# Patient Record
Sex: Female | Born: 1945
Health system: Southern US, Community
[De-identification: ages and names within clinical notes are randomized; demographics above are authoritative.]

## PROBLEM LIST (undated history)

## (undated) DIAGNOSIS — G629 Polyneuropathy, unspecified: Secondary | ICD-10-CM

## (undated) DIAGNOSIS — Z87440 Personal history of urinary (tract) infections: Secondary | ICD-10-CM

## (undated) DIAGNOSIS — J449 Chronic obstructive pulmonary disease, unspecified: Secondary | ICD-10-CM

## (undated) DIAGNOSIS — H269 Unspecified cataract: Secondary | ICD-10-CM

## (undated) DIAGNOSIS — M8580 Other specified disorders of bone density and structure, unspecified site: Secondary | ICD-10-CM

## (undated) DIAGNOSIS — K219 Gastro-esophageal reflux disease without esophagitis: Secondary | ICD-10-CM

## (undated) DIAGNOSIS — J189 Pneumonia, unspecified organism: Secondary | ICD-10-CM

## (undated) DIAGNOSIS — C801 Malignant (primary) neoplasm, unspecified: Secondary | ICD-10-CM

## (undated) DIAGNOSIS — M1990 Unspecified osteoarthritis, unspecified site: Secondary | ICD-10-CM

## (undated) DIAGNOSIS — M810 Age-related osteoporosis without current pathological fracture: Secondary | ICD-10-CM

## (undated) DIAGNOSIS — E119 Type 2 diabetes mellitus without complications: Secondary | ICD-10-CM

## (undated) HISTORY — PX: ABDOMINAL HYSTERECTOMY: SHX81

## (undated) HISTORY — DX: Unspecified cataract: H26.9

## (undated) HISTORY — PX: EYE SURGERY: SHX253

## (undated) HISTORY — PX: CHOLECYSTECTOMY: SHX55

## (undated) HISTORY — PX: KNEE ARTHROSCOPY: SUR90

---

## 1993-05-19 DIAGNOSIS — C801 Malignant (primary) neoplasm, unspecified: Secondary | ICD-10-CM

## 1993-05-19 HISTORY — DX: Malignant (primary) neoplasm, unspecified: C80.1

## 2005-01-26 ENCOUNTER — Emergency Department: Payer: Self-pay | Admitting: Emergency Medicine

## 2005-03-22 ENCOUNTER — Emergency Department: Payer: Self-pay | Admitting: Emergency Medicine

## 2005-04-30 ENCOUNTER — Ambulatory Visit: Payer: Self-pay

## 2005-07-28 ENCOUNTER — Ambulatory Visit: Payer: Self-pay | Admitting: Physician Assistant

## 2005-10-30 ENCOUNTER — Other Ambulatory Visit: Payer: Self-pay

## 2005-11-06 ENCOUNTER — Ambulatory Visit: Payer: Self-pay | Admitting: Orthopaedic Surgery

## 2006-07-01 ENCOUNTER — Ambulatory Visit: Payer: Self-pay | Admitting: Internal Medicine

## 2007-09-15 ENCOUNTER — Ambulatory Visit: Payer: Self-pay | Admitting: Internal Medicine

## 2007-09-29 ENCOUNTER — Ambulatory Visit: Payer: Self-pay | Admitting: Internal Medicine

## 2007-12-02 ENCOUNTER — Ambulatory Visit: Payer: Self-pay | Admitting: Gastroenterology

## 2007-12-13 ENCOUNTER — Ambulatory Visit: Payer: Self-pay | Admitting: Internal Medicine

## 2008-02-23 ENCOUNTER — Ambulatory Visit: Payer: Self-pay | Admitting: Internal Medicine

## 2008-06-12 ENCOUNTER — Ambulatory Visit: Payer: Self-pay | Admitting: Internal Medicine

## 2008-10-03 ENCOUNTER — Ambulatory Visit: Payer: Self-pay | Admitting: Internal Medicine

## 2009-01-24 ENCOUNTER — Emergency Department: Payer: Self-pay | Admitting: Emergency Medicine

## 2009-02-06 ENCOUNTER — Ambulatory Visit: Payer: Self-pay | Admitting: Internal Medicine

## 2010-05-19 ENCOUNTER — Emergency Department: Payer: Self-pay | Admitting: Internal Medicine

## 2010-07-08 ENCOUNTER — Ambulatory Visit: Payer: Self-pay | Admitting: Internal Medicine

## 2010-07-18 ENCOUNTER — Ambulatory Visit: Payer: Self-pay | Admitting: Internal Medicine

## 2010-08-06 ENCOUNTER — Ambulatory Visit: Payer: Self-pay | Admitting: Internal Medicine

## 2010-12-20 ENCOUNTER — Ambulatory Visit: Payer: Self-pay | Admitting: Internal Medicine

## 2011-08-10 ENCOUNTER — Inpatient Hospital Stay: Payer: Self-pay | Admitting: Internal Medicine

## 2011-08-10 LAB — URINALYSIS, COMPLETE
Bilirubin,UR: NEGATIVE
Glucose,UR: >=500 mg/dL (ref 0–75)
Nitrite: POSITIVE
Ph: 5 (ref 4.5–8.0)
Protein: 100
RBC,UR: 7 /HPF (ref 0–5)
Specific Gravity: 1.017 (ref 1.003–1.030)

## 2011-08-10 LAB — COMPREHENSIVE METABOLIC PANEL
Alkaline Phosphatase: 79 U/L (ref 50–136)
Anion Gap: 13 (ref 7–16)
Bilirubin,Total: 0.4 mg/dL (ref 0.2–1.0)
Co2: 24 mmol/L (ref 21–32)
EGFR (African American): >60
EGFR (Non-African Amer.): >60
Glucose: 164 mg/dL — ABNORMAL HIGH (ref 65–99)
Potassium: 4 mmol/L (ref 3.5–5.1)
SGPT (ALT): 24 U/L
Sodium: 143 mmol/L (ref 136–145)
Total Protein: 8.1 g/dL (ref 6.4–8.2)

## 2011-08-10 LAB — CBC
MCHC: 32.9 g/dL (ref 32.0–36.0)
Platelet: 232 10*3/uL (ref 150–440)
RBC: 4.24 10*6/uL (ref 3.80–5.20)
RDW: 13 % (ref 11.5–14.5)

## 2011-08-11 LAB — CBC WITH DIFFERENTIAL/PLATELET
Basophil #: 0 10*3/uL (ref 0.0–0.1)
Eosinophil #: 0 10*3/uL (ref 0.0–0.7)
HCT: 34 % — ABNORMAL LOW (ref 35.0–47.0)
HGB: 11.2 g/dL — ABNORMAL LOW (ref 12.0–16.0)
Lymphocyte %: 15.9 %
MCHC: 33 g/dL (ref 32.0–36.0)
MCV: 92 fL (ref 80–100)
Monocyte #: 0.9 10*3/uL — ABNORMAL HIGH (ref 0.0–0.7)
Monocyte %: 6.7 %
Neutrophil %: 76.9 %
Platelet: 181 10*3/uL (ref 150–440)
RBC: 3.7 10*6/uL — ABNORMAL LOW (ref 3.80–5.20)
WBC: 13.6 10*3/uL — ABNORMAL HIGH (ref 3.6–11.0)

## 2011-08-11 LAB — BASIC METABOLIC PANEL
Anion Gap: 7 (ref 7–16)
BUN: 9 mg/dL (ref 7–18)
Chloride: 107 mmol/L (ref 98–107)
Co2: 26 mmol/L (ref 21–32)
EGFR (African American): >60
EGFR (Non-African Amer.): >60
Osmolality: 279 (ref 275–301)

## 2011-08-11 LAB — HEMOGLOBIN A1C: Hemoglobin A1C: 9.5 % — ABNORMAL HIGH (ref 4.2–6.3)

## 2011-08-12 LAB — CBC WITH DIFFERENTIAL/PLATELET
Basophil #: 0 10*3/uL (ref 0.0–0.1)
Basophil %: 0 %
HCT: 31.9 % — ABNORMAL LOW (ref 35.0–47.0)
HGB: 10.6 g/dL — ABNORMAL LOW (ref 12.0–16.0)
Lymphocyte #: 0.7 10*3/uL — ABNORMAL LOW (ref 1.0–3.6)
Lymphocyte %: 4.1 %
MCH: 30.7 pg (ref 26.0–34.0)
MCV: 92 fL (ref 80–100)
Monocyte #: 0.6 10*3/uL (ref 0.0–0.7)
Monocyte %: 3.8 %
Neutrophil %: 92.1 %
Platelet: 172 10*3/uL (ref 150–440)
RBC: 3.46 10*6/uL — ABNORMAL LOW (ref 3.80–5.20)
RDW: 13.2 % (ref 11.5–14.5)
WBC: 15.9 10*3/uL — ABNORMAL HIGH (ref 3.6–11.0)

## 2011-08-13 LAB — CBC WITH DIFFERENTIAL/PLATELET
Basophil %: 0.3 %
Eosinophil #: 0.1 10*3/uL (ref 0.0–0.7)
Eosinophil %: 1 %
HGB: 10.2 g/dL — ABNORMAL LOW (ref 12.0–16.0)
Lymphocyte #: 1 10*3/uL (ref 1.0–3.6)
MCH: 30.6 pg (ref 26.0–34.0)
MCHC: 33.3 g/dL (ref 32.0–36.0)
MCV: 92 fL (ref 80–100)
Monocyte #: 0.6 10*3/uL (ref 0.0–0.7)
Monocyte %: 5.8 %
Neutrophil %: 83.4 %
RBC: 3.34 10*6/uL — ABNORMAL LOW (ref 3.80–5.20)

## 2011-08-13 LAB — URINE CULTURE

## 2011-08-15 LAB — CREATININE, SERUM
Creatinine: 0.49 mg/dL — ABNORMAL LOW (ref 0.60–1.30)
EGFR (African American): >60
EGFR (Non-African Amer.): >60

## 2011-09-10 ENCOUNTER — Emergency Department: Payer: Self-pay | Admitting: Emergency Medicine

## 2011-09-25 DIAGNOSIS — E1142 Type 2 diabetes mellitus with diabetic polyneuropathy: Secondary | ICD-10-CM | POA: Insufficient documentation

## 2011-09-25 DIAGNOSIS — J449 Chronic obstructive pulmonary disease, unspecified: Secondary | ICD-10-CM | POA: Insufficient documentation

## 2011-09-25 DIAGNOSIS — I10 Essential (primary) hypertension: Secondary | ICD-10-CM | POA: Insufficient documentation

## 2011-09-29 ENCOUNTER — Ambulatory Visit: Payer: Self-pay | Admitting: Specialist

## 2011-10-06 ENCOUNTER — Ambulatory Visit: Payer: Self-pay | Admitting: Urology

## 2011-11-21 ENCOUNTER — Ambulatory Visit: Payer: Self-pay | Admitting: Internal Medicine

## 2011-12-01 ENCOUNTER — Ambulatory Visit: Payer: Self-pay | Admitting: Internal Medicine

## 2011-12-22 ENCOUNTER — Ambulatory Visit: Payer: Self-pay | Admitting: Urology

## 2011-12-22 LAB — BASIC METABOLIC PANEL
BUN: 12 mg/dL (ref 7–18)
Calcium, Total: 9.7 mg/dL (ref 8.5–10.1)
Co2: 27 mmol/L (ref 21–32)
EGFR (Non-African Amer.): >60
Glucose: 184 mg/dL — ABNORMAL HIGH (ref 65–99)
Potassium: 4 mmol/L (ref 3.5–5.1)
Sodium: 140 mmol/L (ref 136–145)

## 2012-01-02 ENCOUNTER — Ambulatory Visit: Payer: Self-pay

## 2012-01-05 ENCOUNTER — Ambulatory Visit: Payer: Self-pay | Admitting: Urology

## 2012-01-30 ENCOUNTER — Ambulatory Visit: Payer: Self-pay | Admitting: Internal Medicine

## 2012-04-18 LAB — URINALYSIS, COMPLETE
Bilirubin,UR: NEGATIVE
Nitrite: NEGATIVE
Ph: 5 (ref 4.5–8.0)
Protein: 30
RBC,UR: 8 /HPF (ref 0–5)
Specific Gravity: 1.03 (ref 1.003–1.030)

## 2012-04-18 LAB — COMPREHENSIVE METABOLIC PANEL
Alkaline Phosphatase: 95 U/L (ref 50–136)
Anion Gap: 7 (ref 7–16)
BUN: 13 mg/dL (ref 7–18)
Bilirubin,Total: 0.3 mg/dL (ref 0.2–1.0)
Creatinine: 0.59 mg/dL — ABNORMAL LOW (ref 0.60–1.30)
EGFR (Non-African Amer.): >60
Potassium: 3.9 mmol/L (ref 3.5–5.1)
SGOT(AST): 21 U/L (ref 15–37)
SGPT (ALT): 30 U/L (ref 12–78)
Total Protein: 8.6 g/dL — ABNORMAL HIGH (ref 6.4–8.2)

## 2012-04-18 LAB — CBC
HCT: 42.5 % (ref 35.0–47.0)
HGB: 14.3 g/dL (ref 12.0–16.0)
MCH: 30.4 pg (ref 26.0–34.0)
MCV: 91 fL (ref 80–100)
RBC: 4.7 10*6/uL (ref 3.80–5.20)
WBC: 20 10*3/uL — ABNORMAL HIGH (ref 3.6–11.0)

## 2012-04-19 ENCOUNTER — Inpatient Hospital Stay: Payer: Self-pay | Admitting: Internal Medicine

## 2012-04-20 LAB — CBC WITH DIFFERENTIAL/PLATELET
Basophil #: 0 10*3/uL (ref 0.0–0.1)
Eosinophil %: 3.9 %
HGB: 11.7 g/dL — ABNORMAL LOW (ref 12.0–16.0)
Lymphocyte #: 2 10*3/uL (ref 1.0–3.6)
Lymphocyte %: 39.3 %
MCH: 30.9 pg (ref 26.0–34.0)
MCV: 91 fL (ref 80–100)
Monocyte #: 0.4 x10 3/mm (ref 0.2–0.9)
Monocyte %: 8.7 %
Neutrophil %: 47.8 %
Platelet: 196 10*3/uL (ref 150–440)
RBC: 3.8 10*6/uL (ref 3.80–5.20)
RDW: 14.1 % (ref 11.5–14.5)
WBC: 5.1 10*3/uL (ref 3.6–11.0)

## 2012-04-20 LAB — BASIC METABOLIC PANEL
Anion Gap: 5 — ABNORMAL LOW (ref 7–16)
Calcium, Total: 8.6 mg/dL (ref 8.5–10.1)
Chloride: 112 mmol/L — ABNORMAL HIGH (ref 98–107)
Co2: 26 mmol/L (ref 21–32)
Creatinine: 0.74 mg/dL (ref 0.60–1.30)
EGFR (African American): >60
Glucose: 198 mg/dL — ABNORMAL HIGH (ref 65–99)
Osmolality: 290 (ref 275–301)
Potassium: 4.1 mmol/L (ref 3.5–5.1)
Sodium: 143 mmol/L (ref 136–145)

## 2012-04-20 LAB — URINE CULTURE

## 2012-11-29 LAB — COMPREHENSIVE METABOLIC PANEL
Albumin: 3.4 g/dL (ref 3.4–5.0)
Chloride: 109 mmol/L — ABNORMAL HIGH (ref 98–107)
Co2: 26 mmol/L (ref 21–32)
Creatinine: 0.74 mg/dL (ref 0.60–1.30)
EGFR (African American): >60
EGFR (Non-African Amer.): >60
Potassium: 3.8 mmol/L (ref 3.5–5.1)
SGOT(AST): 16 U/L (ref 15–37)
Sodium: 140 mmol/L (ref 136–145)

## 2012-11-29 LAB — URINALYSIS, COMPLETE
Nitrite: POSITIVE
Ph: 5 (ref 4.5–8.0)
RBC,UR: 4 /HPF (ref 0–5)
Specific Gravity: 1.025 (ref 1.003–1.030)
WBC UR: 14 /HPF (ref 0–5)

## 2012-11-29 LAB — CBC WITH DIFFERENTIAL/PLATELET
Basophil #: 0 10*3/uL (ref 0.0–0.1)
Eosinophil #: 0.3 10*3/uL (ref 0.0–0.7)
Eosinophil %: 3.1 %
Lymphocyte #: 2.5 10*3/uL (ref 1.0–3.6)
Lymphocyte %: 29.4 %
MCH: 30 pg (ref 26.0–34.0)
Monocyte #: 0.7 x10 3/mm (ref 0.2–0.9)
Monocyte %: 8.1 %
Neutrophil #: 5 10*3/uL (ref 1.4–6.5)
Neutrophil %: 58.9 %
Platelet: 285 10*3/uL (ref 150–440)
RBC: 4.01 10*6/uL (ref 3.80–5.20)
RDW: 13.1 % (ref 11.5–14.5)
WBC: 8.5 10*3/uL (ref 3.6–11.0)

## 2012-11-29 LAB — TROPONIN I: Troponin-I: <0.02 ng/mL

## 2012-11-30 ENCOUNTER — Inpatient Hospital Stay: Payer: Self-pay | Admitting: Internal Medicine

## 2012-11-30 LAB — CBC WITH DIFFERENTIAL/PLATELET
Basophil #: 0 10*3/uL (ref 0.0–0.1)
HCT: 33.2 % — ABNORMAL LOW (ref 35.0–47.0)
Lymphocyte #: 2.8 10*3/uL (ref 1.0–3.6)
MCH: 30 pg (ref 26.0–34.0)
MCV: 91 fL (ref 80–100)
Monocyte %: 8.3 %
Neutrophil %: 49 %
RDW: 13.1 % (ref 11.5–14.5)

## 2012-11-30 LAB — BASIC METABOLIC PANEL
BUN: 12 mg/dL (ref 7–18)
Chloride: 112 mmol/L — ABNORMAL HIGH (ref 98–107)
EGFR (African American): >60
EGFR (Non-African Amer.): >60
Glucose: 123 mg/dL — ABNORMAL HIGH (ref 65–99)
Potassium: 3.7 mmol/L (ref 3.5–5.1)
Sodium: 142 mmol/L (ref 136–145)

## 2012-12-01 LAB — URINE CULTURE

## 2013-10-13 ENCOUNTER — Ambulatory Visit: Payer: Self-pay

## 2013-11-04 ENCOUNTER — Ambulatory Visit: Payer: Self-pay | Admitting: Internal Medicine

## 2013-11-10 DIAGNOSIS — M13161 Monoarthritis, not elsewhere classified, right knee: Secondary | ICD-10-CM | POA: Insufficient documentation

## 2014-04-04 ENCOUNTER — Ambulatory Visit: Payer: Self-pay | Admitting: Internal Medicine

## 2014-04-04 LAB — COMPREHENSIVE METABOLIC PANEL
ANION GAP: 8 (ref 7–16)
Albumin: 4.3 g/dL (ref 3.4–5.0)
Alkaline Phosphatase: 110 U/L
BILIRUBIN TOTAL: 0.3 mg/dL (ref 0.2–1.0)
BUN: 16 mg/dL (ref 7–18)
CALCIUM: 9.3 mg/dL (ref 8.5–10.1)
CREATININE: 0.61 mg/dL (ref 0.60–1.30)
Chloride: 104 mmol/L (ref 98–107)
Co2: 28 mmol/L (ref 21–32)
EGFR (African American): >60
EGFR (Non-African Amer.): >60
Glucose: 108 mg/dL — ABNORMAL HIGH (ref 65–99)
Osmolality: 281 (ref 275–301)
POTASSIUM: 3.9 mmol/L (ref 3.5–5.1)
SGOT(AST): 17 U/L (ref 15–37)
SGPT (ALT): 27 U/L
Sodium: 140 mmol/L (ref 136–145)
Total Protein: 8.5 g/dL — ABNORMAL HIGH (ref 6.4–8.2)

## 2014-04-04 LAB — LIPID PANEL
CHOLESTEROL: 138 mg/dL (ref 0–200)
HDL Cholesterol: 40 mg/dL (ref 40–60)
LDL CHOLESTEROL, CALC: 66 mg/dL (ref 0–100)
Triglycerides: 158 mg/dL (ref 0–200)
VLDL CHOLESTEROL, CALC: 32 mg/dL (ref 5–40)

## 2014-04-04 LAB — CBC WITH DIFFERENTIAL/PLATELET
BASOS ABS: 0 10*3/uL (ref 0.0–0.1)
BASOS PCT: 0.4 %
EOS ABS: 0.1 10*3/uL (ref 0.0–0.7)
Eosinophil %: 1.9 %
HCT: 45.5 % (ref 35.0–47.0)
HGB: 14.9 g/dL (ref 12.0–16.0)
LYMPHS PCT: 34.1 %
Lymphocyte #: 2.5 10*3/uL (ref 1.0–3.6)
MCH: 31.1 pg (ref 26.0–34.0)
MCHC: 32.8 g/dL (ref 32.0–36.0)
MCV: 95 fL (ref 80–100)
MONOS PCT: 6.1 %
Monocyte #: 0.4 x10 3/mm (ref 0.2–0.9)
Neutrophil #: 4.2 10*3/uL (ref 1.4–6.5)
Neutrophil %: 57.5 %
PLATELETS: 248 10*3/uL (ref 150–440)
RBC: 4.8 10*6/uL (ref 3.80–5.20)
RDW: 13.2 % (ref 11.5–14.5)
WBC: 7.2 10*3/uL (ref 3.6–11.0)

## 2014-04-04 LAB — URINALYSIS, COMPLETE
Bilirubin,UR: NEGATIVE
Blood: NEGATIVE
Glucose,UR: NEGATIVE mg/dL (ref 0–75)
Ketone: NEGATIVE
Nitrite: NEGATIVE
PH: 6 (ref 4.5–8.0)
PROTEIN: NEGATIVE
Specific Gravity: >1.05 — ABNORMAL HIGH (ref 1.003–1.030)

## 2014-04-04 LAB — IRON AND TIBC
IRON: 89 ug/dL (ref 50–170)
Iron Bind.Cap.(Total): 407 ug/dL (ref 250–450)
Iron Saturation: 22 %
Unbound Iron-Bind.Cap.: 318 ug/dL

## 2014-04-04 LAB — T4, FREE: Free Thyroxine: 1.04 ng/dL (ref 0.76–1.46)

## 2014-04-04 LAB — FERRITIN: FERRITIN (ARMC): 56 ng/mL (ref 8–388)

## 2014-04-04 LAB — TSH: Thyroid Stimulating Horm: 0.935 u[IU]/mL

## 2014-04-04 LAB — LACTATE DEHYDROGENASE: LDH: 154 U/L (ref 81–246)

## 2014-05-29 DIAGNOSIS — R300 Dysuria: Secondary | ICD-10-CM | POA: Diagnosis not present

## 2014-06-13 DIAGNOSIS — M25531 Pain in right wrist: Secondary | ICD-10-CM | POA: Diagnosis not present

## 2014-06-13 DIAGNOSIS — M19041 Primary osteoarthritis, right hand: Secondary | ICD-10-CM | POA: Insufficient documentation

## 2014-08-31 DIAGNOSIS — M810 Age-related osteoporosis without current pathological fracture: Secondary | ICD-10-CM | POA: Diagnosis not present

## 2014-08-31 DIAGNOSIS — J449 Chronic obstructive pulmonary disease, unspecified: Secondary | ICD-10-CM | POA: Diagnosis not present

## 2014-08-31 DIAGNOSIS — F17210 Nicotine dependence, cigarettes, uncomplicated: Secondary | ICD-10-CM | POA: Diagnosis not present

## 2014-08-31 DIAGNOSIS — I10 Essential (primary) hypertension: Secondary | ICD-10-CM | POA: Diagnosis not present

## 2014-08-31 DIAGNOSIS — E1140 Type 2 diabetes mellitus with diabetic neuropathy, unspecified: Secondary | ICD-10-CM | POA: Diagnosis not present

## 2014-08-31 DIAGNOSIS — M150 Primary generalized (osteo)arthritis: Secondary | ICD-10-CM | POA: Diagnosis not present

## 2014-09-05 NOTE — Discharge Summary (Signed)
PATIENT NAME:  Alexandra Yang, Alexandra Yang MR#:  [id number] DATE OF BIRTH:  [birth year]  DATE OF ADMISSION:  04/19/2012 DATE OF DISCHARGE:  04/21/2012  DIAGNOSES:  1. SIRS with fever, leukocytosis possibly due to bilateral pyelonephritis. 2. Diabetes. 3. Hypertension. 4. Hyperlipidemia. 5. Chronic obstructive pulmonary disease. 6. Ongoing smoking.   DISPOSITION: The patient is being discharged home.  FOLLOW-UP:  1. Follow-up with Dr. Madelin Headings on 12/18 as scheduled.  2. Follow-up with Dr. Clayborn Bigness in 1 to 2 weeks after discharge.   DIET: Low sodium, 1800 calorie ADA calorie diet.   ACTIVITY: As tolerated.   DISCHARGE MEDICATIONS:  1. Aspirin 81 mg daily.  2. Multivitamin 1 tablet daily.  3. Zocor 40 mg daily.  4. Glucovance 5/500 two tablets twice a day. 5. Lisinopril 5 mg daily.  6. Levemir 10 units subcutaneously once a day if blood glucose is below 200; 15 units if above 200. 7. Mobic 7.5 mg twice a day as needed for pain. 8. Bactrim DS 800/160 1 tablet twice a day for 10 days.  LABORATORY, DIAGNOSTIC, AND RADIOLOGICAL DATA: Urine culture showed mixed bacterial organisms. White count 20,000 on admission, normal by the time of discharge. Hemoglobin 14.3. Normal platelet count. Glucose 205, BUN 13, creatinine 0.59, sodium 156, potassium 3.9, chloride 105, CO2 24. LFTs normal.   HOSPITAL COURSE: The patient is a 69 year old female with past medical history of diabetes, hypertension, COPD, and ongoing smoking who was admitted 04/19/2012 for ESBL Escherichia coli UTI. At that time she had a PICC line and was treated with IV Invanz for a prolonged period of time. Since then she has followed up with Dr. Madelin Headings and has had multiple cystoscopies. She presented with nausea, high-grade fever, and flank pain. She was admitted with diagnosis of fever and SIRS with bilateral pyelonephritis. Her white count was 20,000. She was admitted to the hospital and started on IV Invanz. Urine culture was sent which  grew mixed bacterial organisms. After initiation of antibiotics she did not have any fever in the hospital. Her white count also normalized. She is being switched to oral antibiotics by the time of discharge. She has an appointment with Dr. Madelin Headings on 05/05/2012 which she has been advised to keep.   Her other medical problems including diabetes, hypertension, and COPD remained stable. She is being discharged home in a stable condition.   TIME SPENT: 45 minutes.   ____________________________ Cherre Huger, MD sp:drc D: 04/21/2012 14:03:09 ET T: 04/21/2012 14:13:16 ET JOB#: [id number]  cc: Cherre Huger, MD, <Dictator> Alcus Dad, MD Lavera Guise, MD Cherre Huger MD ELECTRONICALLY SIGNED 04/21/2012 14:52

## 2014-09-05 NOTE — H&P (Signed)
PATIENT NAME:  Alexandra Yang, Alexandra Yang MR#:  [id number] DATE OF BIRTH:  [birth year]  DATE OF ADMISSION:  04/19/2012  REFERRING PHYSICIAN: Dr. Marjean Donna PRIMARY CARE PHYSICIAN:  Dr. Clayborn Bigness   CHIEF COMPLAINT: Fever and bilateral flank pain.   HISTORY OF PRESENT ILLNESS: This is a 69 year old female with significant past medical history of diabetes mellitus, hypertension, and chronic obstructive pulmonary disease who presents with complaints of fever and bilateral flank pain. The patient is known to have history of recurrent urinary tract infections, growing ESBL in March of this year. As well she has been seeing urologist Dr. Madelin Headings where she had recent cystoscopy and was found to have a trigonitis and proximal urethral polyps which were cauterized. As well the patient was found to have leukocytosis of 20,000. She had a temperature of 99.3 in the Emergency Department but she reports having fever of 101 at home. The patient reports she had multiple courses of antibiotics over the last few months for recurrent urinary tract infection. The patient as well is complaining of generalized weakness and fatigue and decreased p.o. intake. The patient received IV ertapenem in the Emergency Department. The hospitalist service was requested to admit the patient for further management of her pyelonephritis.   PAST MEDICAL HISTORY:  1. Hypertension.  2. Diabetes.  3. Chronic obstructive pulmonary disease.  4. Tobacco abuse.   SOCIAL HISTORY: Smoking 1/2 pack a day for 30 years. No alcohol drinking or illicit drugs.   FAMILY HISTORY: Significant for diabetes.   PAST SURGICAL HISTORY: Recent cystoscopy with cauterization of trigonitis and proximal urethral polyps.   ALLERGIES: The patient has history of allergy to Cipro, Percodan, Ultram, and Voltaren.   HOME MEDICATIONS:  1. Zocor 40 mg oral daily.  2. Mobic 7.5 mg 2 times a day. 3. Lisinopril 5 mg oral daily. 4. Levemir 10 to 15 units subcutaneous daily.   5. Glucovance 5/500 mg, 2 tablets oral 2 times a day.  6. Centrum 1 tablet oral daily.  7. Aspirin 81 mg oral daily.   REVIEW OF SYSTEMS: CONSTITUTIONAL:  Fever, chills, fatigue, and weakness. EYES: Denies blurry vision, double vision, pain, or inflammation. ENT: Denies tinnitus, ear pain, hearing loss, or epistaxis. RESPIRATORY: Denies cough, wheezing, hemoptysis, painful respirations, or dyspnea. CARDIOVASCULAR: Denies chest pain, orthopnea, edema, or arrhythmia.  GI: Denies nausea, vomiting, diarrhea, abdominal pain, hematemesis, or gastroesophageal reflux disease. GU: Denies any hematuria. Complains of dysuria, polyuria, and flank pain. ENDO: Denies polyuria, polydipsia, heat or cold intolerance. INTEGUMENT: Denies acne, rash, or lesions. HEMATOLOGY: Denies anemia, easy bruising, or bleeding diathesis. NEURO: Denies numbness, weakness, dysarthria, epilepsy, tremors, or vertigo. PSYCH: Denies anxiety, insomnia, nervousness, schizophrenia, or bipolar disorder.   PHYSICAL EXAMINATION:  VITAL SIGNS: Temperature 99.3, pulse 102, respiratory rate 21, blood pressure 118/72, saturating 96% on room air.   GENERAL: Well-nourished female who looks comfortable in bed in no apparent distress.   HEENT: Head atraumatic, normocephalic. Pupils equal, reactive to light. Pink conjunctivae. Anicteric sclerae. Moist oral mucosa.   NECK: Supple. No thyromegaly. No JVD.   CHEST: Good air entry bilaterally. No wheezing, rales, or rhonchi.   CARDIOVASCULAR: S1, S2 heard. No rubs, murmur, or gallops.   ABDOMEN: Soft, nontender, nondistended. Bowel sounds present.   EXTREMITIES: No edema. No clubbing. No cyanosis.   PSYCHIATRIC: Appropriate affect. Awake, alert, oriented times three. Intact judgment and insight.   NEUROLOGIC: Cranial nerves grossly intact. Motor 5/5 in all extremities.   SKIN: Normal skin turgor. Warm and dry.  PSYCHIATRIC: Awake, alert, oriented times three. Intact judgment and insight.  Appropriate.   LABORATORY DATA: Glucose 205,  BUN 13, creatinine 0.59, sodium 136, potassium 3.9, chloride 105, CO2 24, AST 21, ALT 30 alkaline phosphatase 95. White blood cells 20,000, hemoglobin 14.3, hematocrit 42.5, platelets 261. Urinalysis showing 26 white blood cells with +3 leukocyte esterase.   ASSESSMENT AND PLAN: This is a 69 year old female who presents with fever and bilateral flank pain. Has urinary tract infection and significant leukocytosis.  1. Sepsis: The patient has leukocytosis with fever of 101 at home and tachycardia upon presentation. This is related to her urinary tract infection.  2. pyelonephritis: The patient has known history of ESBL with recurrent urinary tract infections.presents with B/L flank pain,  Recent cystoscopy in August with trigonitis with urethral polyps status post catheterization. The patient will be admitted for IV ertapenem.  We will follow urine culture.  3. Diabetes mellitus: The patient will be continued on medication and we will add insulin sliding scale for her as well.  4. Hypertension: We will resume home medication.  5. Hyperlipidemia: Continue with statin.  6. Tobacco abuse: The patient was counseled and refuses nicotine patch at this point.  7. Deep vein thrombosis prophylaxis: Subcutaneous heparin.  8. CODE STATUS: The patient is DO NOT RESUSCITATE.  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 She does not wish to be resuscitated.   TOTAL TIME SPENT ON ADMISSION AND PATIENT CARE: 50 minutes.   ____________________________ Albertine Patricia, MD dse:bjt D:  04/19/2012 04:20:39 ET          T: 04/19/2012 08:54:08 ET        JOB#: [id number]  cc: Lavera Guise, MD Alaina Donati Graciela Husbands MD ELECTRONICALLY SIGNED 04/20/2012 2:11

## 2014-09-05 NOTE — H&P (Signed)
PATIENT NAME:  Alexandra Yang, Alexandra Yang MR#:  [id number] DATE OF BIRTH:  01/07/1946  DATE OF ADMISSION:  04/19/2012  ADDENDUM  ASSESSMENT AND PLAN: Previously dictated urinary tract infection. This should be corrected to diagnosis of pyelonephritis as the patient is complaining of bilateral flank pain, having costophrenic tenderness to palpation bilaterally, on physical examination. So she will be treated with ertapenem for her pyelonephritis, especially as the patient has known history of ESBL infection in the past.  ____________________________ Albertine Patricia, MD dse:slb D: 04/19/2012 04:25:49 ET T: 04/19/2012 08:29:56 ET JOB#: [id number]  cc: Albertine Patricia, MD, <Dictator> Cecille Mcclusky Graciela Husbands MD ELECTRONICALLY SIGNED 04/20/2012 2:16

## 2014-09-05 NOTE — Op Note (Signed)
PATIENT NAME:  Alexandra Yang, Alexandra Yang MR#:  [id number] DATE OF BIRTH:  [birth year]  DATE OF PROCEDURE:  01/05/2012  PREOPERATIVE DIAGNOSES:  1. Pseudomembranous trigonitis. 2. Recurrent urinary tract infections.   POSTOPERATIVE DIAGNOSES:  1. Interstitial cystitis.  2. Pseudomembranous trigonitis.  3. Urethral polyps.   PROCEDURES:  1. Cystoscopy.  2. Hydrodilation. 3. Cauterization of the trigone. 4. Cauterization of urethral polyps.   SURGEON: Alcus Dad, MD  ANESTHESIA: General.   INDICATIONS: 69 year old woman has had recurrent urinary tract infections with persistent symptoms. She was allergic to a number of agents including opioids. Cystoscopic examination has revealed pseudomembranous trigonitis and chronic irritation of the bladder. She is scheduled for hydrodilation and fulguration.   DESCRIPTION OF PROCEDURE: In the dorsal lithotomy position under general anesthesia, the lower abdomen and genital area were prepped and draped for endoscopic work. The #21 cystoscope was introduced and inspection performed reconfirming the previously noted trigonitis. There were a few proximal urethral polyps.   Hydrodilation was performed. The bladder accepting 800, 900 and 950 mL respectively. Numerous glomerulations were present primarily in the base of the bladder and around the trigone although the anecdotal glomerulations occurred over the entire bladder surface. Following hydrodilation the ball electrode was introduced and the trigone carefully cauterized. The proximal urethral polyps were likewise cauterized.   A Foley catheter was placed in the bladder and left indwelling. A B and O suppository was placed rectally and the procedure was concluded. The patient tolerated the procedure well sustaining a blood loss of less than 5 mL. She returned to the recovery area in satisfactory condition.  ____________________________ Alcus Dad, MD jsh:cms D: 01/05/2012 13:55:56 ET T: 01/05/2012  14:09:37 ET JOB#: [id number]  cc: Alcus Dad, MD, <Dictator> Alcus Dad MD ELECTRONICALLY SIGNED 01/08/2012 12:09

## 2014-09-08 NOTE — Discharge Summary (Signed)
PATIENT NAME:  Alexandra Yang, Alexandra Yang MR#:  [id number] DATE OF BIRTH:  Mar 27, 1946  DATE OF ADMISSION:  11/30/2012 DATE OF DISCHARGE:  12/01/2012  DISCHARGE DIAGNOSES:  1.  Urinary tract infection with extended-spectrum beta-lactamase. 2.  Diabetes mellitus.  3.  Hyperlipidemia.  4.  Chronic obstructive pulmonary disease.  5.  Hypertension.   CONDITION ON DISCHARGE: Stable.   CODE STATUS: FULL CODE.   DISCHARGE MEDICATIONS: 1.  Aspirin 81 mg once a day. 2.  Centrum Silver oral tablet once a day.  3.  Zocor 40 mg once a day.  4.  Glucovance 5 mg/500 mg oral tablet 2 tablets 2 times a day.  5.  Lisinopril 5 mg oral tablet once a day.  6.  Levemir 10 units subcutaneous once a day. 7.  Victoza 1.2 mg subcutaneous once a day. 8.  Mobic 7.5 mg oral tablet 2 times a day as needed for pain.  9.  Ertapenem 1 gram IV every 24 hours for 8 days.   HOME HEALTH ON DISCHARGE: Yes.   HOME HEALTH SERVICES: Nurse.   HOME HEALTH INSTRUCTIONS: IV antibiotic infusion and PICC line care and advised to remove PICC line finishing therapy for 8 days and having discussion with primary care physician.   DIET: Low sodium, carbohydrate-controlled ADA diet, regular consistency.   ACTIVITY: As tolerated.   TIMEFRAME TO FOLLOW-UP:  Within 1 to 2 weeks with Dr. Humphrey Rolls.   HISTORY OF PRESENT ILLNESS: The patient is a 69 year old female with history of kidney stone, interstitial nephritis and frequent urinary tract infections. Last year she had ESBL urinary tract infection requiring multiple courses of IV Invanz.  She had some chills, was tired and fatigued, some blood in the urine also she went to her PMD's office, and she was started on Septra. Temperature went up to 101 even after starting that and she was not eating, her back was hurting, she had chills, and after 3 days her PMD's office called back that culture of the urine, which was taken in the office, reported coli sensitive to only gentamicin, tobramycin and  ertapenem.  So she told the patient to go to the ER and get admitted.   HOSPITAL COURSE AND STAY: As the patient was admitted with UTI and she had evidence of having ESBL in urine, in PMD's office, she was placed with PICC line in the ER. The culture in the hospital also grew ESBL and we finally discharged her with ertapenem IV once a day and home visiting nurse services.   OTHER MEDICAL ISSUES IN THE HOSPITAL: 1.  Diabetes. We continued Glucophage and glipizide with Victoza in the hospital.  2.  Hyperlipidemia. Continued Zocor.  3.  Chronic obstructive pulmonary disease and tobacco abuse. Smoking cessation counseling was done and we continued her inhaler therapy.  4  Hypertension. Continued low-dose lisinopril.  IMPORTANT LABORATORY RESULTS: In the hospital stay: Urinalysis was positive with 14 WBCs and 2+ leukocyte esterase.  Culture of the urine:  More than 100,000 CFU E. coli, which are sensitive to gentamicin, imipenem and ertapenem. Her blood sugars remained slightly on the higher side, ranging from 150 to 160.   TIME SPENT ON THIS DISCHARGE:  45 minutes. ____________________________ Ceasar Lund Anselm Jungling, MD vgv:sb D: 12/02/2012 08:05:00 ET T: 12/02/2012 08:31:16 ET JOB#: [id number]  cc: Ceasar Lund. Anselm Jungling, MD, <Dictator> Lavera Guise, MD Rosalio Macadamia [facility] MD ELECTRONICALLY SIGNED 12/07/2012 16:11

## 2014-09-08 NOTE — H&P (Signed)
PATIENT NAME:  Alexandra Yang, Alexandra Yang MR#:  [id number] DATE OF BIRTH:  1946/02/10  DATE OF ADMISSION:  11/29/2012  PRIMARY CARE PHYSICIAN: Clayborn Bigness, MD  CHIEF COMPLAINT: Sent in for multidrug resistant urinary tract infection.   HISTORY OF PRESENT ILLNESS: This is a 69 year old female who has a history of kidney stones, interstitial nephritis, and frequent urinary tract infections. Last year she did have an ESBL urinary tract infection requiring multiple courses of IV Invanz.   On Wednesday she had some chills, she was tired and fatigued, had some blood in the urine.  Thursday very hot and cold, fatigue and high temperature. She saw Dr. Humphrey Rolls on Friday, was started on some Septra. Friday night her temperature went up to 101. She was not eating. Her back was hurting. Saturday she had chills. On Sunday she felt better. Today she was called by Dr. Clayborn Bigness about the culture results of the urine that showed multidrug resistant E. coli sensitive to only gentamicin, tobramycin and ertapenem. She referred the patient in for a direct admission.   The patient also complains of a cough since Tuesday of last week, now a lot of postnasal drip.   PAST MEDICAL HISTORY: Kidney stones, interstitial nephritis, COPD, diabetes, hyperlipidemia, frequent UTIs.   PAST SURGICAL HISTORY: Cystoscopy, oophorectomy, hysterectomy, cholecystectomy, eye surgery.   ALLERGIES: PERCOCET, NAPROSYN AND CIPRO.   MEDICATIONS: Include: 1.  Victoza 1.2 mg subcutaneous injection in the a.m. 2.  Lisinopril 2.5 mg daily. 3.  Aspirin 81 mg daily. 4.  Centrum Silver 1 tablet daily. 5.  Mobic 7.5 mg p.r.n. 6.  Zocor 40 mg at bedtime. 7.  Glucophage/glyburide combination 1/2 tablet making it 250 mg/2.5 mg at bedtime. 8.  Vicodin p.r.n. 9.  Phenergan p.r.n.   SOCIAL HISTORY: She smokes 1/2 to 1 pack per day. She drank a half of beer last night. No drug use. She is a recently retired Therapist, sports.   FAMILY HISTORY: Mother died at 59 of  pancreatic cancer, also had diabetes. Father died of lung cancer.  Sister with thyroid disease. Sister with breast cancer. A brother with asthma.   REVIEW OF SYSTEMS: CONSTITUTIONAL: Positive for fever and chills last week. Positive for weight loss with the Victoza.  Positive for fatigue.  EYES: She does wear glasses and has a lazy eye of the left.  EARS, NOSE, MOUTH, AND THROAT: Positive for runny nose. Positive for postnasal drip.  CARDIOVASCULAR: No chest pain. No palpitations.  RESPIRATORY: Positive for cough, whitish phlegm. No hemoptysis. No shortness of breath.  GASTROINTESTINAL: Positive for nausea and vomiting the other day. No abdominal pain. No diarrhea. Positive constipation. No bright red blood per rectum. No melena.  GENITOURINARY: Positive for blood in the urine with this UTI. Increased urination.  MUSCULOSKELETAL: No joint pains.  INTEGUMENT: No rashes or eruptions.  NEUROLOGIC: No fainting or blackouts.  PSYCHIATRIC: No anxiety or depression.  ENDOCRINE: No thyroid problems.  HEMATOLOGIC/LYMPHATIC: No anemia.   PHYSICAL EXAMINATION: VITAL SIGNS: Temperature 98.1, pulse 95, respirations 20, blood pressure 123/69, pulse ox 93% on room air.  GENERAL: No respiratory distress.  EYES: Conjunctivae and lids normal. Pupils equal, round, and reactive to light. Extraocular muscles intact. No nystagmus.  EARS, NOSE, MOUTH, AND THROAT: Nasal mucosa no erythema. Throat no erythema. No exudate seen. Lips and gums no lesions.  NECK: No JVD. No bruits. No lymphadenopathy. No thyromegaly. No thyroid nodules palpated.  LUNGS: Clear to auscultation. No use of accessory muscles to breathe. No rhonchi, rales, or wheeze  heard.  HEART:  S1 and S2 normal. No gallops, rubs or murmurs heard. Carotid upstroke 2+ bilaterally. No bruits.  EXTREMITIES: Dorsalis pedis pulses 2+ bilaterally. No edema of the lower extremities.  ABDOMEN: Soft. Positive tenderness in the left lower quadrant. No  organomegaly/splenomegaly. Normoactive bowel sounds. GENITOURINARY:  Slight discomfort in the left back, more to palpation and seems a little lower than the kidney area.  LYMPHATIC: No lymph nodes in the neck.  MUSCULOSKELETAL: No clubbing, edema or cyanosis.  SKIN: No rashes or ulcers seen.  NEUROLOGIC: Cranial nerves II through XII grossly intact. Deep tendon reflexes 1+ bilateral lower extremities.  PSYCHIATRIC: The patient is oriented to person, place and time.   LABORATORY DATA:  Urine culture sent in by Dr. Etta Quill office shows E. coli resistant to all antibiotics, except for ertapenem, tobramycin and gentamicin.   ASSESSMENT AND PLAN: 1.  Complex multidrug resistant urinary tract infection with Escherichia coli.  We will put in isolation for the possibility of extended-spectrum beta-lactamase.  We will obtain a PICC line, stat labs, CBC and CMP.  With her history of kidney stones, we will get a CT scan of the abdomen without contrast, stone protocol. We will start IV Invanz 1 gram IV daily. Will need at least a 10 day course, possibility of a 14 day course. Will give IV fluid hydration.  2.  Diabetes. We will continue Glucophage and glipizide and the Victoza.   3.  Hyperlipidemia.  We will continue the Zocor.  4.  History of chronic obstructive pulmonary disease and tobacco abuse. Smoking cessation counseling done, 3 minutes by me. Refused nicotine patch.  7.  Hypertension. We will continue low dose lisinopril.  TIME SPENT ON ADMISSION: 50 minutes.  ____________________________ Tana Conch. Leslye Peer, MD rjw:sb D: 11/29/2012 15:43:49 ET T: 11/29/2012 15:58:43 ET JOB#: [id number]  cc: Tana Conch. Leslye Peer, MD, <Dictator> Lavera Guise, MD Marisue Brooklyn MD ELECTRONICALLY SIGNED 11/30/2012 19:17

## 2014-09-08 NOTE — Op Note (Signed)
PATIENT NAME:  Alexandra Yang, Alexandra Yang MR#:  [id number] DATE OF BIRTH:  1945-05-26  DATE OF PROCEDURE:  11/29/2012  PREOPERATIVE DIAGNOSES:   1.  Bacteremia. 2.  Multi-drug resistant urinary tract infection.  POSTOPERATIVE DIAGNOSES:  1.  Bacteremia. 2.  Multi-drug resistant urinary tract infection.  PROCEDURES:  1. Ultrasound guidance for vascular access to right basilic vein.  2. Fluoroscopic guidance for placement of catheter.  3. Insertion of a Morpheus single-lumen peripherally inserted central venous catheter, right arm.  SURGEON: Hortencia Pilar, MD  ANESTHESIA: Local.   ESTIMATED BLOOD LOSS: Minimal.   INDICATION FOR PROCEDURE: Requiring IV antibiotics for greater than 5 days.  DESCRIPTION OF PROCEDURE: The patient's right arm was sterilely prepped and draped, and a sterile surgical field was created. The basilic vein was accessed under direct ultrasound guidance without difficulty with a micropuncture needle and permanent image was recorded. 0.018 wire was then placed into the superior vena cava. Peel-away sheath was placed over the wire. A single lumen peripherally inserted central venous catheter was then placed over the wire and the wire and peel-away sheath were removed. The catheter tip was placed into the superior vena cava and was secured at the skin at 39 cm with a sterile dressing. The catheter withdrew blood well and flushed easily with heparinized saline. The patient tolerated procedure well.  ____________________________ Alexandra Cabal, MD ggs:aw D: 11/30/2012 10:37:34 ET T: 11/30/2012 11:21:49 ET JOB#: [id number]  cc: Alexandra Cabal, MD, <Dictator> Alexandra Cabal MD ELECTRONICALLY SIGNED 12/04/2012 11:14

## 2014-09-10 NOTE — Op Note (Signed)
PATIENT NAME:  Alexandra Yang, Alexandra Yang MR#:  [id number] DATE OF BIRTH:  1945/10/28  DATE OF PROCEDURE:  08/14/2011  PREOPERATIVE DIAGNOSES:  1. Escherichia coli sepsis with pyelonephritis.  2. Requirement for antibiotics, greater than five days.   POSTOPERATIVE DIAGNOSES:  1. Escherichia coli sepsis with pyelonephritis.  2. Requirement for antibiotics, greater than five days.   PROCEDURE PERFORMED: Insertion of a right basilic single-lumen PICC line.   SURGEON: Hortencia Pilar, MD  DESCRIPTION OF PROCEDURE: The patient is brought to special procedures and positioned supine with her right arm extended palm upward. The right arm is prepped and draped in sterile fashion. Ultrasound is placed in a sterile sleeve. Ultrasound is utilized secondary to lack of appropriate landmarks and to avoid vascular injury. Under direct ultrasound visualization, the basilic vein is identified. It is echolucent, homogeneous, and easily compressible indicating patency. Under direct ultrasound visualization, after image is recorded for the permanent record, micropuncture needle is inserted into the basilic vein. Microwire is then easily advanced under fluoroscopic guidance and positioned with the tip at the atriocaval junction. Appropriate measurements are made and a Morpheus single-lumen PICC line is opened onto the field. It is trimmed to 35 cm and subsequently advanced over the wire. PICC line aspirates and flushes easily and is secured to the arm with a StatLock and a sterile dressing. The patient tolerated the procedure well and there were no immediate complications.  ____________________________ Katha Cabal, MD ggs:slb D: 08/19/2011 17:56:09 ET     T: 08/20/2011 09:35:45 ET        JOB#: [id number] cc: Katha Cabal, MD, <Dictator> Katha Cabal MD ELECTRONICALLY SIGNED 08/20/2011 11:22

## 2014-09-10 NOTE — H&P (Signed)
PATIENT NAME:  Alexandra Yang, Alexandra Yang MR#:  [id number] DATE OF BIRTH:  December 12, 1945  DATE OF ADMISSION:  08/10/2011  CHIEF COMPLAINT: Fever, chills, flank pain for three days.   HISTORY OF PRESENT ILLNESS: The patient is a 69 year old Caucasian female with a history of hypertension, diabetes, chronic obstructive pulmonary disease, remote history of urinary tract infection 20 years ago, presented to the ED with fever, chills and bilateral flank pain for three days associated with nausea and vomiting many times. In addition, she complains of lower abdominal pain and urinary frequency and urgency but denies any dysuria or hematuria. No incontinence. The patient denies any other symptoms. Her urinalysis showed nitrate positive and WBC 230. She was treated with Rocephin in the Emergency Department  and admitted for pyelonephritis.   PAST MEDICAL HISTORY:  1. Hypertension. 2. Diabetes. 3. Chronic obstructive pulmonary disease.   SOCIAL HISTORY: Smoking 1/2 pack a day for about 30 years. No alcohol drinking or illicit drugs.   FAMILY HISTORY: Parents had diabetes.   PAST SURGICAL HISTORY: None.    REVIEW OF SYSTEMS: CONSTITUTIONAL: The patient has a fever, chills, weakness, poor appetite but no headache or dizziness. ENT: No epistaxis, postnasal drip or dysphagia. EYES: No double vision or blurred vision. CARDIOVASCULAR: No chest pain, palpitations, orthopnea, or nocturnal dyspnea. No leg edema. PULMONARY: No cough, sputum, shortness of breath or hemoptysis. GASTROINTESTINAL: Positive for abdominal pain, nausea, vomiting, but no diarrhea. No melena. No bloody stool. GENITOURINARY: Positive for urinary frequency, urgency, but no dysuria, hematuria, or incontinence. SKIN: No rash or jaundice. ENDOCRINOLOGY: No heat or cold intolerance. NEUROLOGICAL: No syncope, loss of consciousness or seizure. PSYCHIATRIC: No anxiety or depression.  PHYSICAL EXAMINATION:  VITAL SIGNS:  Temperature 98.3, blood pressure 126/66,  pulse 118, respirations 18, oxygen saturation 96% on room air.   GENERAL: The patient is alert, awake, oriented. She is shaking, has chills now.   HEENT: Pupils are round, equal, and reactive to light and accommodation. Moist oral mucosa. Clear oropharynx.   NECK: Supple. No JVD or carotid bruits. No lymphadenopathy. No thyromegaly.   CARDIOVASCULAR: S1, S2, regular rate and rhythm, tachycardia.   PULMONARY: Bilateral air entry. No wheezing or rales. No use of muscles to breathe.   ABDOMEN: Soft, bowel sounds are present. No organomegaly. Tenderness in the lower part of the abdomen. In addition, there is bilateral flank tenderness.   EXTREMITIES: No edema, clubbing, or cyanosis. No calf tenderness. Strong bilateral pedal pulses.   SKIN: No rash or jaundice.   NEUROLOGICAL: Alert and oriented x3. No focal deficit. Power five out of five. Sensation intact. Deep tendon reflexes 2+.   LABORATORY, DIAGNOSTIC AND RADIOLOGICAL DATA:  Glucose 164, BUN 13, creatinine 0.67. Electrolytes normal.  WBC 15.5, hemoglobin 12.9, platelets 232.  Urinalysis showed nitrate positive, leukocyte esterase 3+, white blood cells 230, bacteria 2+.   IMPRESSION:  1. Pyelonephritis. 2. Urinary tract infection.  3. Systemic inflammatory response syndrome.  4. Hypertension, controlled.  5. Diabetes.  6. Chronic obstructive pulmonary disease.  7. Tobacco use.  PLAN OF TREATMENT:  1. The patient was given Rocephin and IV fluid in the ED and we will continue.  2. We will give morphine, Zofran p.r.n.  3. Follow up CBC, blood culture, urine culture, CRP, BMP, and a hemoglobin A1c.  4. For hypertension, continue lisinopril.  5. For diabetes, we will start sliding scale, continue Levemir, but hold metformin.  6. For chronic obstructive pulmonary disease, we will give Duo-Neb nebulizer p.r.n.  7. Tobacco cessation  consult.  8. GI and deep vein thrombosis prophylaxis.   I discussed the patient's situation and the  plan of treatment with the patient.   TIME SPENT: About 50 minutes.   ____________________________ Demetrios Loll, MD qc:cbb D: 08/10/2011 19:05:33 ET T: 08/11/2011 08:05:18 ET JOB#: [id number]  cc: Demetrios Loll, MD, <Dictator> Demetrios Loll MD ELECTRONICALLY SIGNED 08/14/2011 18:38

## 2014-09-10 NOTE — Discharge Summary (Signed)
PATIENT NAME:  Alexandra Yang, Alexandra Yang MR#:  [id number] DATE OF BIRTH:  09-01-1945  DATE OF ADMISSION:  08/10/2011 DATE OF DISCHARGE:  08/15/2011  PRIMARY CARE PHYSICIAN: Clayborn Bigness, MD  REASON FOR ADMISSION: Fevers, chills, and flank pain.   DISCHARGE DIAGNOSES:  1. Extended-spectrum beta-lactamase positive Escherichia coli urinary tract infection and possible acute pyelonephritis. 2. Systemic antiinflammatory response syndrome.  3. History of hypertension. 4. History of diabetes mellitus.  5. History of chronic obstructive pulmonary disease. 6. History of tobacco abuse.  7. Leukocytosis due to pyelonephritis and urinary tract infection with normalization of white blood cell count with antibiotics.   CONSULTATIONS: None.   PROCEDURES: PICC line insertion on 08/14/2011.  PERTINENT LABS/STUDIES: Complete metabolic panel normal on admission, except for serum glucose elevated at 164. CBC normal on admission, except for WBC 15.4 and WBC is 10.6 from 08/13/2011.   Urinalysis on admission with cloudy urine, positive nitrite, 3+ leukocyte esterase, 230 WBCs and 2+ bacteria.   Urine culture from 08/10/2011 positive for ESBL-positive Escherichia coli which is resistant to ampicillin, ceftriaxone, ciprofloxacin, cefazolin, trimethoprim sulfamethoxazole, and levofloxacin and sensitive to imipenem, gentamicin, nitrofurantoin, and cefoxitin.   Blood cultures x2 from 08/10/2011 shows no growth to date.   Hemoglobin A1c is 9.5.   BRIEF HISTORY/HOSPITAL COURSE: The patient is a pleasant 69 year old female with past medical history of hypertension, type 2 diabetes mellitus which is insulin requiring, chronic obstructive pulmonary disease, and tobacco abuse who presented to the Emergency Department with complaints of fevers, chills, and flank pain. Please see the dictated admission history and physical for pertinent details surrounding the onset of this hospitalization. Please see below for further  details. 1. ESBL positive Escherichia coli urinary tract infection/cystitis with complicated urinary tract infection and presumed pyelonephritis - urinalysis at the time of presentation was indicative of a urinary tract infection was significant pyuria. She was also noted to have leukocytosis. Blood and urine cultures were obtained and she was empirically started on IV antibiotics, initially in the form of ceftriaxone. While being on ceftriaxone, the patient's overall clinical condition did not significantly improve initially and she still had significant flank pain as well as nausea and some suprapubic discomfort. Urine culture returned positive for ESBL positive Escherichia coli and thereafter antibiotics were adjusted and she was taken off IV ceftriaxone and placed on IV Invanz, which she tolerated well, and with IV Invanz her overall clinical condition has significantly improved with resolution of her flank pain and she has also defervesced and her WBC count has normalized. She still has some nausea but no further vomiting and her overall clinical condition has significantly improved. For complicated UTI, she was recommended a total 10 day course of IV antibiotics, and she has underwent PICC line insertion for IV antibiotics, which she will receive at home via assistance of a home health agency.  2. Systemic antiinflammatory response syndrome due to UTI and pyelonephritis manifested by fevers, leukocytosis, hypotension, and tachycardia initially - her SIRS has resolved with normalization of her hemodynamics with IV antibiotic therapy and she has defervesced and her WBC count has normalized.  3. Leukocytosis - due to pyelonephritis and UTI. Leukocytosis has resolved with antibiotic therapy and blood culture has not revealed any growth to date and urine culture revealed ESBL positive Escherichia coli, as above, for which the patient will be maintained on IV Invanz for seven more days as an outpatient.  4. Type  2 diabetes mellitus - insulin requiring and poorly controlled with a Hemoglobin A1c of  9.2. Her Levemir may need to be further adjusted, but we will defer this to her primary care physician. She was also maintained on Glucovance and she received sliding scale insulin while hospitalized and placed on an ADA diet. 5. History of hypertension - the patient came in hypotensive, as above, with SIRS. Initially her blood pressure medications were held and she was placed on IV fluids and IV antibiotics and thereafter SIRS and hypotension had resolved. Blood pressure thereafter started to rise. The patient is to resume lisinopril upon hospital discharge. 6. Chronic obstructive pulmonary disease - stable and without acute exacerbation. The patient is to continue p.r.n. bronchodilator with p.r.n. DuoNebs and albuterol MDI. We will also consider adding an additional agent such as Advair or Spiriva, but this will be deferred to her pulmonologist. 7. Tobacco abuse: The patient was strongly counseled on the importance of smoking cessation.   On 08/15/2011, the patient was hemodynamically stable and without any fevers, chills, or flank pain and she was felt to be stable for discharge home with home health with close outpatient follow-up to which the patient was agreeable.   DISCHARGE DISPOSITION: Home with home health.   DISCHARGE CONDITION: Improved, stable.  DISCHARGE ACTIVITY: As tolerated.  DISCHARGE DIET: Low sodium, ADA, low fat, low cholesterol.   DISCHARGE MEDICATIONS:  1. Invanz 1 gram IV via PICC line every 24 hours x7 days.  2. Norco 5/325 mg one to two tablets p.o. every 8 hours p.r.n. pain. 3. Phenergan 25 mg p.o. every 8 hours p.r.n. nausea and vomiting. 4. Levemir 10 units subcutaneously every a.m., 5 units subcutaneously every p.m.  5. Tylenol 325 mg one to two tablets p.o. every 4 to 6 hours p.r.n. pain. 6. Aspirin 81 mg p.o. daily. 7. NovoLog sliding scale insulin before meals and at bedtime.  Use same sliding scale as before. 8. Glucovance 5 mg/500 mg two tablets p.o. twice a day. 9. Zocor 40 mg p.o. every p.m. 10. Centrum Silver multivitamin 1 tablet p.o. daily. 11. Lisinopril 5 mg p.o. daily.   DISCHARGE INSTRUCTIONS:  1. Take medications as prescribed.  2. Return to the emergency department for recurrence of symptoms.   FOLLOWUP INSTRUCTIONS: 1. Followup with Dr. Clayborn Bigness within 1 week after completion of IV antibiotics to have your PICC line removed and for repeat urinalysis and urine culture.  2. The patient is to receive PICC line care per protocol (to be performed by home health agency nursing staff).   TIME SPENT ON DISCHARGE: Greater than 30 minutes.  ____________________________ Romie Jumper, MD knl:slb D: 08/19/2011 21:37:52 ET T: 08/20/2011 11:27:28 ET JOB#: [id number]  cc: Romie Jumper, MD, <Dictator> Lavera Guise, MD Romie Jumper MD ELECTRONICALLY SIGNED 08/27/2011 22:02

## 2014-09-21 DIAGNOSIS — M810 Age-related osteoporosis without current pathological fracture: Secondary | ICD-10-CM | POA: Diagnosis not present

## 2014-09-21 DIAGNOSIS — M8588 Other specified disorders of bone density and structure, other site: Secondary | ICD-10-CM | POA: Diagnosis not present

## 2014-11-13 DIAGNOSIS — E11329 Type 2 diabetes mellitus with mild nonproliferative diabetic retinopathy without macular edema: Secondary | ICD-10-CM | POA: Diagnosis not present

## 2014-11-30 ENCOUNTER — Other Ambulatory Visit: Payer: Self-pay | Admitting: Physician Assistant

## 2014-11-30 ENCOUNTER — Ambulatory Visit
Admission: RE | Admit: 2014-11-30 | Discharge: 2014-11-30 | Disposition: A | Discharge status: Home / Self Care | Payer: Commercial Managed Care - HMO | Source: Ambulatory Visit | Attending: Physician Assistant | Admitting: Physician Assistant

## 2014-11-30 DIAGNOSIS — R079 Chest pain, unspecified: Secondary | ICD-10-CM | POA: Insufficient documentation

## 2014-11-30 DIAGNOSIS — S299XXA Unspecified injury of thorax, initial encounter: Secondary | ICD-10-CM | POA: Diagnosis not present

## 2014-11-30 DIAGNOSIS — R0781 Pleurodynia: Secondary | ICD-10-CM | POA: Diagnosis not present

## 2014-11-30 DIAGNOSIS — W19XXXA Unspecified fall, initial encounter: Secondary | ICD-10-CM

## 2014-11-30 DIAGNOSIS — M810 Age-related osteoporosis without current pathological fracture: Secondary | ICD-10-CM | POA: Diagnosis not present

## 2014-11-30 DIAGNOSIS — R0789 Other chest pain: Secondary | ICD-10-CM | POA: Diagnosis not present

## 2014-12-18 DIAGNOSIS — S6991XA Unspecified injury of right wrist, hand and finger(s), initial encounter: Secondary | ICD-10-CM | POA: Diagnosis not present

## 2014-12-18 DIAGNOSIS — M79641 Pain in right hand: Secondary | ICD-10-CM | POA: Diagnosis not present

## 2014-12-21 ENCOUNTER — Ambulatory Visit
Admission: RE | Admit: 2014-12-21 | Discharge: 2014-12-21 | Disposition: A | Discharge status: Home / Self Care | Payer: Commercial Managed Care - HMO | Source: Ambulatory Visit | Attending: Nurse Practitioner | Admitting: Nurse Practitioner

## 2014-12-21 ENCOUNTER — Other Ambulatory Visit: Payer: Self-pay | Admitting: Nurse Practitioner

## 2014-12-21 DIAGNOSIS — F17210 Nicotine dependence, cigarettes, uncomplicated: Secondary | ICD-10-CM | POA: Diagnosis not present

## 2014-12-21 DIAGNOSIS — S6991XA Unspecified injury of right wrist, hand and finger(s), initial encounter: Secondary | ICD-10-CM | POA: Diagnosis not present

## 2014-12-21 DIAGNOSIS — M79641 Pain in right hand: Secondary | ICD-10-CM | POA: Diagnosis not present

## 2014-12-21 DIAGNOSIS — S638X1D Sprain of other part of right wrist and hand, subsequent encounter: Secondary | ICD-10-CM | POA: Diagnosis not present

## 2014-12-21 DIAGNOSIS — M7989 Other specified soft tissue disorders: Secondary | ICD-10-CM | POA: Diagnosis not present

## 2014-12-28 DIAGNOSIS — I10 Essential (primary) hypertension: Secondary | ICD-10-CM | POA: Diagnosis not present

## 2014-12-28 DIAGNOSIS — E1140 Type 2 diabetes mellitus with diabetic neuropathy, unspecified: Secondary | ICD-10-CM | POA: Diagnosis not present

## 2014-12-28 DIAGNOSIS — F17210 Nicotine dependence, cigarettes, uncomplicated: Secondary | ICD-10-CM | POA: Diagnosis not present

## 2014-12-28 DIAGNOSIS — N390 Urinary tract infection, site not specified: Secondary | ICD-10-CM | POA: Diagnosis not present

## 2014-12-28 DIAGNOSIS — M810 Age-related osteoporosis without current pathological fracture: Secondary | ICD-10-CM | POA: Diagnosis not present

## 2014-12-28 DIAGNOSIS — S638X1D Sprain of other part of right wrist and hand, subsequent encounter: Secondary | ICD-10-CM | POA: Diagnosis not present

## 2014-12-28 DIAGNOSIS — J449 Chronic obstructive pulmonary disease, unspecified: Secondary | ICD-10-CM | POA: Diagnosis not present

## 2015-01-11 DIAGNOSIS — M20021 Boutonniere deformity of right finger(s): Secondary | ICD-10-CM | POA: Diagnosis not present

## 2015-01-18 DIAGNOSIS — M79641 Pain in right hand: Secondary | ICD-10-CM | POA: Diagnosis not present

## 2015-01-19 ENCOUNTER — Encounter (HOSPITAL_COMMUNITY): Payer: Self-pay | Admitting: *Deleted

## 2015-01-19 ENCOUNTER — Other Ambulatory Visit: Payer: Self-pay | Admitting: Orthopedic Surgery

## 2015-01-20 ENCOUNTER — Ambulatory Visit (HOSPITAL_COMMUNITY): Payer: Commercial Managed Care - HMO | Admitting: Anesthesiology

## 2015-01-20 ENCOUNTER — Encounter (HOSPITAL_COMMUNITY): Payer: Self-pay | Admitting: *Deleted

## 2015-01-20 ENCOUNTER — Encounter (HOSPITAL_COMMUNITY)
Admission: RE | Disposition: A | Discharge status: Home / Self Care | Payer: Self-pay | Source: Ambulatory Visit | Attending: Orthopedic Surgery

## 2015-01-20 ENCOUNTER — Ambulatory Visit (HOSPITAL_COMMUNITY)
Admission: RE | Admit: 2015-01-20 | Discharge: 2015-01-20 | Disposition: A | Discharge status: Home / Self Care | Payer: Commercial Managed Care - HMO | Source: Ambulatory Visit | Attending: Orthopedic Surgery | Admitting: Orthopedic Surgery

## 2015-01-20 DIAGNOSIS — F17210 Nicotine dependence, cigarettes, uncomplicated: Secondary | ICD-10-CM | POA: Insufficient documentation

## 2015-01-20 DIAGNOSIS — I10 Essential (primary) hypertension: Secondary | ICD-10-CM | POA: Diagnosis not present

## 2015-01-20 DIAGNOSIS — S63262A Dislocation of metacarpophalangeal joint of right middle finger, initial encounter: Secondary | ICD-10-CM | POA: Diagnosis not present

## 2015-01-20 DIAGNOSIS — K219 Gastro-esophageal reflux disease without esophagitis: Secondary | ICD-10-CM | POA: Diagnosis not present

## 2015-01-20 DIAGNOSIS — G8929 Other chronic pain: Secondary | ICD-10-CM | POA: Diagnosis not present

## 2015-01-20 DIAGNOSIS — M6588 Other synovitis and tenosynovitis, other site: Secondary | ICD-10-CM | POA: Diagnosis not present

## 2015-01-20 DIAGNOSIS — J449 Chronic obstructive pulmonary disease, unspecified: Secondary | ICD-10-CM | POA: Diagnosis not present

## 2015-01-20 DIAGNOSIS — X58XXXA Exposure to other specified factors, initial encounter: Secondary | ICD-10-CM | POA: Insufficient documentation

## 2015-01-20 DIAGNOSIS — S56423D Laceration of extensor muscle, fascia and tendon of right middle finger at forearm level, subsequent encounter: Secondary | ICD-10-CM | POA: Diagnosis not present

## 2015-01-20 DIAGNOSIS — M1990 Unspecified osteoarthritis, unspecified site: Secondary | ICD-10-CM | POA: Diagnosis not present

## 2015-01-20 DIAGNOSIS — S61401A Unspecified open wound of right hand, initial encounter: Secondary | ICD-10-CM | POA: Diagnosis not present

## 2015-01-20 DIAGNOSIS — Z7982 Long term (current) use of aspirin: Secondary | ICD-10-CM | POA: Insufficient documentation

## 2015-01-20 DIAGNOSIS — E119 Type 2 diabetes mellitus without complications: Secondary | ICD-10-CM | POA: Insufficient documentation

## 2015-01-20 HISTORY — DX: Other specified disorders of bone density and structure, unspecified site: M85.80

## 2015-01-20 HISTORY — DX: Pneumonia, unspecified organism: J18.9

## 2015-01-20 HISTORY — DX: Chronic obstructive pulmonary disease, unspecified: J44.9

## 2015-01-20 HISTORY — DX: Gastro-esophageal reflux disease without esophagitis: K21.9

## 2015-01-20 HISTORY — DX: Polyneuropathy, unspecified: G62.9

## 2015-01-20 HISTORY — DX: Age-related osteoporosis without current pathological fracture: M81.0

## 2015-01-20 HISTORY — PX: TENDON TRANSFER: SHX6109

## 2015-01-20 HISTORY — DX: Unspecified osteoarthritis, unspecified site: M19.90

## 2015-01-20 HISTORY — DX: Type 2 diabetes mellitus without complications: E11.9

## 2015-01-20 HISTORY — DX: Personal history of urinary (tract) infections: Z87.440

## 2015-01-20 LAB — BASIC METABOLIC PANEL
Anion gap: 7 (ref 5–15)
BUN: 14 mg/dL (ref 6–20)
CHLORIDE: 105 mmol/L (ref 101–111)
CO2: 24 mmol/L (ref 22–32)
Calcium: 9.4 mg/dL (ref 8.9–10.3)
Creatinine, Ser: 0.47 mg/dL (ref 0.44–1.00)
GFR calc Af Amer: >60 mL/min (ref >60)
GFR calc non Af Amer: >60 mL/min (ref >60)
Glucose, Bld: 139 mg/dL — ABNORMAL HIGH (ref 65–99)
POTASSIUM: 4.1 mmol/L (ref 3.5–5.1)
SODIUM: 136 mmol/L (ref 135–145)

## 2015-01-20 LAB — GLUCOSE, CAPILLARY
GLUCOSE-CAPILLARY: 131 mg/dL — AB (ref 65–99)
Glucose-Capillary: 128 mg/dL — ABNORMAL HIGH (ref 65–99)
Glucose-Capillary: 95 mg/dL (ref 65–99)

## 2015-01-20 LAB — CBC
HEMATOCRIT: 38.3 % (ref 36.0–46.0)
Hemoglobin: 12.5 g/dL (ref 12.0–15.0)
MCH: 30.6 pg (ref 26.0–34.0)
MCHC: 32.6 g/dL (ref 30.0–36.0)
MCV: 93.6 fL (ref 78.0–100.0)
Platelets: 210 10*3/uL (ref 150–400)
RBC: 4.09 MIL/uL (ref 3.87–5.11)
RDW: 13.1 % (ref 11.5–15.5)
WBC: 8 10*3/uL (ref 4.0–10.5)

## 2015-01-20 SURGERY — TRANSFER, TENDON
Anesthesia: General | Site: Hand | Laterality: Right

## 2015-01-20 MED ORDER — BUPIVACAINE HCL (PF) 0.25 % IJ SOLN
INTRAMUSCULAR | Status: DC | PRN
  Administered 2015-01-20: 9 mL

## 2015-01-20 MED ORDER — HYDROCODONE-ACETAMINOPHEN 5-325 MG PO TABS: 2.0000 | ORAL_TABLET | Freq: Four times a day (QID) | ORAL | Status: DC | PRN

## 2015-01-20 MED ORDER — LACTATED RINGERS IV SOLN
INTRAVENOUS | Status: DC
  Administered 2015-01-20: 11:00:00 via INTRAVENOUS

## 2015-01-20 MED ORDER — CHLORHEXIDINE GLUCONATE 4 % EX LIQD: 60.0000 mL | Freq: Once | CUTANEOUS | Status: DC

## 2015-01-20 MED ORDER — DEXAMETHASONE SODIUM PHOSPHATE 4 MG/ML IJ SOLN
INTRAMUSCULAR | Status: DC | PRN
  Administered 2015-01-20: 4 mg via INTRAVENOUS

## 2015-01-20 MED ORDER — MIDAZOLAM HCL 2 MG/2ML IJ SOLN
INTRAMUSCULAR | Status: AC
  Filled 2015-01-20: qty 4

## 2015-01-20 MED ORDER — LIDOCAINE HCL (CARDIAC) 20 MG/ML IV SOLN
INTRAVENOUS | Status: DC | PRN
  Administered 2015-01-20: 60 mg via INTRAVENOUS

## 2015-01-20 MED ORDER — MIDAZOLAM HCL 5 MG/5ML IJ SOLN
INTRAMUSCULAR | Status: DC | PRN
  Administered 2015-01-20: 2 mg via INTRAVENOUS

## 2015-01-20 MED ORDER — FENTANYL CITRATE (PF) 100 MCG/2ML IJ SOLN
INTRAMUSCULAR | Status: DC | PRN
  Administered 2015-01-20: 50 ug via INTRAVENOUS

## 2015-01-20 MED ORDER — BUPIVACAINE HCL (PF) 0.25 % IJ SOLN
INTRAMUSCULAR | Status: AC
  Filled 2015-01-20: qty 30

## 2015-01-20 MED ORDER — CEFAZOLIN SODIUM-DEXTROSE 2-3 GM-% IV SOLR
2.0000 g | INTRAVENOUS | Status: AC
  Administered 2015-01-20 (×2): 2 g via INTRAVENOUS
  Filled 2015-01-20: qty 50

## 2015-01-20 MED ORDER — PROPOFOL 10 MG/ML IV BOLUS
INTRAVENOUS | Status: AC
  Filled 2015-01-20: qty 20

## 2015-01-20 MED ORDER — HYDROMORPHONE HCL 1 MG/ML IJ SOLN
INTRAMUSCULAR | Status: AC
  Filled 2015-01-20: qty 1

## 2015-01-20 MED ORDER — FENTANYL CITRATE (PF) 250 MCG/5ML IJ SOLN
INTRAMUSCULAR | Status: AC
  Filled 2015-01-20: qty 5

## 2015-01-20 MED ORDER — PROMETHAZINE HCL 25 MG/ML IJ SOLN: 6.2500 mg | INTRAMUSCULAR | Status: DC | PRN

## 2015-01-20 MED ORDER — LACTATED RINGERS IV SOLN
INTRAVENOUS | Status: DC | PRN
  Administered 2015-01-20: 13:00:00 via INTRAVENOUS

## 2015-01-20 MED ORDER — PROPOFOL 10 MG/ML IV BOLUS
INTRAVENOUS | Status: DC | PRN
  Administered 2015-01-20: 150 mg via INTRAVENOUS

## 2015-01-20 MED ORDER — PHENYLEPHRINE HCL 10 MG/ML IJ SOLN
INTRAMUSCULAR | Status: DC | PRN
  Administered 2015-01-20 (×2): 80 ug via INTRAVENOUS
  Administered 2015-01-20: 120 ug via INTRAVENOUS
  Administered 2015-01-20 (×2): 80 ug via INTRAVENOUS

## 2015-01-20 MED ORDER — SODIUM CHLORIDE 0.9 % IR SOLN
Status: DC | PRN
  Administered 2015-01-20: 1000 mL

## 2015-01-20 MED ORDER — SODIUM CHLORIDE 0.45 % IV SOLN: INTRAVENOUS | Status: DC

## 2015-01-20 MED ORDER — ONDANSETRON HCL 4 MG/2ML IJ SOLN
INTRAMUSCULAR | Status: DC | PRN
  Administered 2015-01-20: 4 mg via INTRAVENOUS

## 2015-01-20 MED ORDER — HYDROMORPHONE HCL 1 MG/ML IJ SOLN: 0.2500 mg | INTRAMUSCULAR | Status: DC | PRN

## 2015-01-20 SURGICAL SUPPLY — 51 items
BANDAGE ELASTIC 3 VELCRO ST LF (GAUZE/BANDAGES/DRESSINGS) ×2 IMPLANT
BANDAGE ELASTIC 4 VELCRO ST LF (GAUZE/BANDAGES/DRESSINGS) IMPLANT
BNDG COHESIVE 1X5 TAN STRL LF (GAUZE/BANDAGES/DRESSINGS) IMPLANT
BNDG CONFORM 2 STRL LF (GAUZE/BANDAGES/DRESSINGS) ×2 IMPLANT
BNDG GAUZE ELAST 4 BULKY (GAUZE/BANDAGES/DRESSINGS) ×4 IMPLANT
CORDS BIPOLAR (ELECTRODE) ×3 IMPLANT
COVER SURGICAL LIGHT HANDLE (MISCELLANEOUS) ×3 IMPLANT
CUFF TOURNIQUET SINGLE 18IN (TOURNIQUET CUFF) ×3 IMPLANT
CUFF TOURNIQUET SINGLE 24IN (TOURNIQUET CUFF) IMPLANT
DECANTER SPIKE VIAL GLASS SM (MISCELLANEOUS) ×3 IMPLANT
DRAPE SURG 17X23 STRL (DRAPES) ×3 IMPLANT
DRSG ADAPTIC 3X8 NADH LF (GAUZE/BANDAGES/DRESSINGS) ×2 IMPLANT
GAUZE SPONGE 2X2 8PLY STRL LF (GAUZE/BANDAGES/DRESSINGS) IMPLANT
GAUZE SPONGE 4X4 12PLY STRL (GAUZE/BANDAGES/DRESSINGS) ×2 IMPLANT
GAUZE XEROFORM 1X8 LF (GAUZE/BANDAGES/DRESSINGS) IMPLANT
GAUZE XEROFORM 5X9 LF (GAUZE/BANDAGES/DRESSINGS) ×2 IMPLANT
GLOVE BIOGEL M STRL SZ7.5 (GLOVE) ×3 IMPLANT
GLOVE SS BIOGEL STRL SZ 8 (GLOVE) ×1 IMPLANT
GLOVE SUPERSENSE BIOGEL SZ 8 (GLOVE) ×2
GOWN STRL REUS W/ TWL LRG LVL3 (GOWN DISPOSABLE) ×2 IMPLANT
GOWN STRL REUS W/ TWL XL LVL3 (GOWN DISPOSABLE) ×3 IMPLANT
GOWN STRL REUS W/TWL LRG LVL3 (GOWN DISPOSABLE) ×6
GOWN STRL REUS W/TWL XL LVL3 (GOWN DISPOSABLE) ×9
KIT BASIN OR (CUSTOM PROCEDURE TRAY) ×3 IMPLANT
KIT ROOM TURNOVER OR (KITS) ×3 IMPLANT
LOOP VESSEL MAXI BLUE (MISCELLANEOUS) IMPLANT
MANIFOLD NEPTUNE II (INSTRUMENTS) ×3 IMPLANT
NEEDLE HYPO 25GX1X1/2 BEV (NEEDLE) IMPLANT
NS IRRIG 1000ML POUR BTL (IV SOLUTION) ×3 IMPLANT
PACK ORTHO EXTREMITY (CUSTOM PROCEDURE TRAY) ×3 IMPLANT
PAD ARMBOARD 7.5X6 YLW CONV (MISCELLANEOUS) ×6 IMPLANT
PAD CAST 4YDX4 CTTN HI CHSV (CAST SUPPLIES) IMPLANT
PADDING CAST COTTON 4X4 STRL (CAST SUPPLIES)
SOLUTION BETADINE 4OZ (MISCELLANEOUS) ×3 IMPLANT
SPEAR EYE SURG WECK-CEL (MISCELLANEOUS) IMPLANT
SPECIMEN JAR SMALL (MISCELLANEOUS) ×3 IMPLANT
SPLINT FIBERGLASS 3X12 (CAST SUPPLIES) ×2 IMPLANT
SPONGE GAUZE 2X2 STER 10/PKG (GAUZE/BANDAGES/DRESSINGS)
SPONGE SCRUB IODOPHOR (GAUZE/BANDAGES/DRESSINGS) ×3 IMPLANT
SUCTION FRAZIER TIP 10 FR DISP (SUCTIONS) IMPLANT
SUT MERSILENE 4 0 P 3 (SUTURE) IMPLANT
SUT PROLENE 4 0 PS 2 18 (SUTURE) IMPLANT
SUT VIC AB 2-0 CT1 27 (SUTURE)
SUT VIC AB 2-0 CT1 TAPERPNT 27 (SUTURE) IMPLANT
SYR CONTROL 10ML LL (SYRINGE) IMPLANT
TOWEL OR 17X24 6PK STRL BLUE (TOWEL DISPOSABLE) ×3 IMPLANT
TOWEL OR 17X26 10 PK STRL BLUE (TOWEL DISPOSABLE) ×3 IMPLANT
TUBE CONNECTING 12'X1/4 (SUCTIONS)
TUBE CONNECTING 12X1/4 (SUCTIONS) IMPLANT
UNDERPAD 30X30 INCONTINENT (UNDERPADS AND DIAPERS) ×3 IMPLANT
WATER STERILE IRR 1000ML POUR (IV SOLUTION) ×3 IMPLANT

## 2015-01-20 NOTE — Anesthesia Procedure Notes (Signed)
Procedure Name: LMA Insertion Date/Time: 01/20/2015 1:41 PM Performed by: Suzy Bouchard Pre-anesthesia Checklist: Patient identified, Timeout performed, Suction available, Patient being monitored and Emergency Drugs available Patient Re-evaluated:Patient Re-evaluated prior to inductionOxygen Delivery Method: Circle system utilized Preoxygenation: Pre-oxygenation with 100% oxygen Intubation Type: IV induction Ventilation: Mask ventilation without difficulty LMA: LMA inserted LMA Size: 4.0 Placement Confirmation: positive ETCO2 and breath sounds checked- equal and bilateral Tube secured with: Tape

## 2015-01-20 NOTE — Op Note (Signed)
See dictation#[id number] Patient underwent EDC reconstruction with tendon transfer to stabilize the MCP joint mechanics She'll be discharged home today  Jermar Colter MD

## 2015-01-20 NOTE — Anesthesia Postprocedure Evaluation (Signed)
  Anesthesia Post-op Note  Patient: Alexandra Yang  Procedure(s) Performed: Procedure(s): RIGHT HAND AND MIDDLE FINGER TENDON RECONSTRUCTION AND TRANSFER (Right)  Patient Location: PACU  Anesthesia Type:General  Level of Consciousness: awake and alert   Airway and Oxygen Therapy: Patient Spontanous Breathing  Post-op Pain: none  Post-op Assessment: Post-op Vital signs reviewed              Post-op Vital Signs: Reviewed  Last Vitals:  Filed Vitals:   01/20/15 1625  BP: 140/77  Pulse: 74  Temp: 36.6 C  Resp: 18    Complications: No apparent anesthesia complications

## 2015-01-20 NOTE — Op Note (Signed)
Alexandra Yang, Alexandra Yang               ACCOUNT NO.:  [id number]  MEDICAL RECORD NO.:  [id number]  LOCATION:  [facility]                         FACILITY:  [facility]  PHYSICIAN:  Satira Anis. 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  She has failed conservative management and presents with instability and chronic pain with synovitis.  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  She has failed conservative management and presents with instability and chronic pain with synovitis.  PROCEDURE: 1. Extensor digitorum communis reconstruction with juncturae tendinum     transfer to the radial collateral ligament and back upon itself and     the [facility] proper to the middle finger Nicholaus Bloom tendon transfer     with extensor repair, right middle finger). 2. Extensive extensor tendon tenolysis, tenosynovectomy, and joint     debridement of the MCP joint, right middle finger.  SURGEON:  Satira Anis. Amedeo Plenty, M.D.  ASSISTANT:  None.  COMPLICATIONS:  None.  ANESTHESIA:  General.  TOURNIQUET TIME:  Less than an hour.  INDICATIONS FOR THE PROCEDURE:  This patient is a 69 year old female, who is 4 weeks out from a rupture of the [facility]  She has chronic dislocation features, cannot extend her finger without pain and marked disarray of the anatomy.  I have discussed with her findings, risks, and benefits of surgery, and she desires to proceed with surgical stabilization.  OPERATIVE PROCEDURE:  The patient was seen by myself and Anesthesia, taken to operative theater and underwent smooth induction of general anesthetic, laid supine, fully padded, prepped and draped in usual sterile fashion.  I  prescrubbed her with Hibiclens followed by Betadine scrub and paint about the right upper extremity.  Time-out was called. Outline marks were made.  Preoperative antibiotics had been given, and the patient then underwent a curvilinear incision dorsally.  Skin flap was elevated nicely.  There were no complicating features.  Following skin flap elevation, the patient had extensor tendon tenolysis, tenosynovectomy accomplished, and limited joint debridement of the MCP joint of right middle finger.  She had marked inflammation, this was removed from the field.  The extensor tendon was disengaged from the capsular recesses with radial sagittal band incompetence.  The radial sagittal band was not capable of any meaningful direct repair that would support loading of the tendon, and thus we set out to perform stabilization with a tendon transfer.  The patient had very careful and cautious harvesting of the juncturae ulnarly.  This tendon was approximately 5 cm.  I tunneled the dissection to harvest it.  I then placed it through the [facility] from ulnar to radial, and then placed it around the radial collateral ligament architecture and then back up through the radial collateral ligament and back upon itself with 4-0 FiberWire.  This is a technique as described by Deatra Ina and [employer].  The patient tolerated this well.  Following this, she was taken through multiple range of  motion attempts.  She was stable, looked excellent, and there was no tendency toward subluxation.  The radial sagittal band fibers were not really meaningfully competent for direct repair.  The ulnar tissue was not overly tight.  After rebalancing the patient with tendon transfer, I felt very good about how she looks.  Following this, we then irrigated copiously and deflated the tourniquet. Bleeding points were treated with bipolar electrocautery and the wound was closed.  A 10 mL of Sensorcaine with epinephrine was placed  with Adaptic, Xeroform, and a standard postop dressing was applied with a volar plaster splint.  Volar plaster splint was placed and the wrist and fingers were in extension with the DIP's free.  We will allow her to go home today.  Norco written for pain.  I will see her back in 14 days.  We will take her out of the bandage, remove her sutures, and place her in a cast.  At 4 weeks postop, she will go down to therapy in between weeks 4 through 6.  We will allow her 30-50% MCP range of motion and make sure we really work hard on the PIP and DIP range of motion.  After 6 weeks, predicated upon tissue healing quality, we will go ahead and begin some gentle full range of motion attempts and at 8-10 weeks, push her and thereafter some degree of strengthening.  Our goal is to stabilize tendon.  She understands the do's and don'ts, and all questions have been encouraged and answered.  I was delighted to treat her.  She is a very nice lady.     Satira Anis. Amedeo Plenty, M.D.     [facility]  D:  01/20/2015  T:  01/20/2015  Job:  [id number]

## 2015-01-20 NOTE — Transfer of Care (Signed)
Immediate Anesthesia Transfer of Care Note  Patient: Alexandra Yang  Procedure(s) Performed: Procedure(s): RIGHT HAND AND MIDDLE FINGER TENDON RECONSTRUCTION AND TRANSFER (Right)  Patient Location: PACU  Anesthesia Type:General  Level of Consciousness: sedated  Airway & Oxygen Therapy: Patient Spontanous Breathing  Post-op Assessment: Report given to RN and Post -op Vital signs reviewed and stable  Post vital signs: Reviewed and stable  Last Vitals:  Filed Vitals:   01/20/15 1104  BP: 145/112  Pulse: 85  Temp: 36.5 C  Resp: 18    Complications: No apparent anesthesia complications

## 2015-01-20 NOTE — Anesthesia Preprocedure Evaluation (Signed)
Anesthesia Evaluation  Patient identified by MRN, date of birth, ID band Patient awake    Reviewed: Allergy & Precautions, NPO status , Patient's Chart, lab work & pertinent test results  Airway Mallampati: II  TM Distance: >3 FB Neck ROM: Full    Dental   Pulmonary COPDCurrent Smoker,  breath sounds clear to auscultation        Cardiovascular hypertension, Rhythm:Regular Rate:Normal     Neuro/Psych negative neurological ROS     GI/Hepatic GERD-  ,  Endo/Other  diabetes, Type 2, Oral Hypoglycemic Agents  Renal/GU      Musculoskeletal   Abdominal   Peds  Hematology   Anesthesia Other Findings   Reproductive/Obstetrics                             Anesthesia Physical Anesthesia Plan  ASA: III  Anesthesia Plan: General   Post-op Pain Management:    Induction: Intravenous  Airway Management Planned: LMA  Additional Equipment:   Intra-op Plan:   Post-operative Plan:   Informed Consent: I have reviewed the patients History and Physical, chart, labs and discussed the procedure including the risks, benefits and alternatives for the proposed anesthesia with the patient or authorized representative who has indicated his/her understanding and acceptance.     Plan Discussed with: CRNA and Surgeon  Anesthesia Plan Comments:         Anesthesia Quick Evaluation

## 2015-01-20 NOTE — H&P (Signed)
Alexandra Yang is an 69 y.o. female.   Chief Complaint: Right middle finger EDC subluxation dislocation with painful MCP joint HPI: Patient presents for surgical reconstruction right hand in the form of a EDC stabilization tendon transfer and repair. She is failed conservative management desires to proceed with surgical  care.  Past Medical History  Diagnosis Date  . Diabetes mellitus without complication     type 2  . COPD (chronic obstructive pulmonary disease)   . Pneumonia   . History of kidney infection     had to have PICC line  . GERD (gastroesophageal reflux disease)   . Neuropathy     both feet  . Arthritis     osteoarthritis  . Osteopenia   . Osteoporosis     Past Surgical History  Procedure Laterality Date  . Abdominal hysterectomy    . Cholecystectomy    . Knee arthroscopy Right     Family History  Problem Relation Age of Onset  . Pancreatic cancer Mother   . Lung cancer Father    Social History:  reports that she has been smoking Cigarettes.  She has been smoking about 0.25 packs per day. She has never used smokeless tobacco. She reports that she drinks alcohol. She reports that she does not use illicit drugs.  Allergies:  Allergies  Allergen Reactions  . Percodan [Oxycodone-Aspirin] Other (See Comments)    hallucinations  . Ciprofloxacin Rash and Other (See Comments)    Feels like body is on fire  . Naproxen Rash  . Ultram [Tramadol] Rash    Medications Prior to Admission  Medication Sig Dispense Refill  . aspirin 81 MG chewable tablet Chew 81 mg by mouth every evening.     . cholecalciferol (VITAMIN D) 1000 UNITS tablet Take 1,000 Units by mouth daily.    Marland Kitchen gabapentin (NEURONTIN) 300 MG capsule Take 300 mg by mouth at bedtime as needed (pain).    Marland Kitchen HYDROcodone-acetaminophen (NORCO/VICODIN) 5-325 MG per tablet Take 0.5 tablets by mouth every 4 (four) hours as needed.  0  . lisinopril (PRINIVIL,ZESTRIL) 2.5 MG tablet Take 2.5 mg by mouth daily.    .  meloxicam (MOBIC) 7.5 MG tablet Take 7.5 mg by mouth daily as needed for pain.    . metFORMIN (GLUMETZA) 1000 MG (MOD) 24 hr tablet Take 1,000 mg by mouth daily with breakfast.    . metoCLOPramide (REGLAN) 10 MG tablet Take 20 mg by mouth daily.    . Multiple Vitamins-Minerals (CENTRUM SILVER PO) Take 1 tablet by mouth daily.    . pregabalin (LYRICA) 50 MG capsule Take 50 mg by mouth at bedtime as needed (pain).    . simvastatin (ZOCOR) 40 MG tablet Take 40 mg by mouth at bedtime.    Marland Kitchen VICTOZA 18 MG/3ML SOPN Inject 1.8 Units into the skin every morning.  6  . vitamin B-12 (CYANOCOBALAMIN) 1000 MCG tablet Take 3,000 mcg by mouth daily.      Results for orders placed or performed during the hospital encounter of 01/20/15 (from the past 48 hour(s))  Glucose, capillary     Status: Abnormal   Collection Time: 01/20/15 11:21 AM  Result Value Ref Range   Glucose-Capillary 128 (H) 65 - 99 mg/dL  Basic metabolic panel     Status: Abnormal   Collection Time: 01/20/15 11:45 AM  Result Value Ref Range   Sodium 136 135 - 145 mmol/L   Potassium 4.1 3.5 - 5.1 mmol/L   Chloride 105 101 - 111  mmol/L   CO2 24 22 - 32 mmol/L   Glucose, Bld 139 (H) 65 - 99 mg/dL   BUN 14 6 - 20 mg/dL   Creatinine, Ser 0.47 0.44 - 1.00 mg/dL   Calcium 9.4 8.9 - 10.3 mg/dL   GFR calc non Af Amer >60 >60 mL/min   GFR calc Af Amer >60 >60 mL/min    Comment: (NOTE) The eGFR has been calculated using the CKD EPI equation. This calculation has not been validated in all clinical situations. eGFR's persistently <60 mL/min signify possible Chronic Kidney Disease.    Anion gap 7 5 - 15  CBC     Status: None   Collection Time: 01/20/15 11:45 AM  Result Value Ref Range   WBC 8.0 4.0 - 10.5 K/uL   RBC 4.09 3.87 - 5.11 MIL/uL   Hemoglobin 12.5 12.0 - 15.0 g/dL   HCT 38.3 36.0 - 46.0 %   MCV 93.6 78.0 - 100.0 fL   MCH 30.6 26.0 - 34.0 pg   MCHC 32.6 30.0 - 36.0 g/dL   RDW 13.1 11.5 - 15.5 %   Platelets 210 150 - 400 K/uL    No results found.  Review of Systems  Constitutional: Negative.   Eyes: Negative.   Respiratory: Negative.   Gastrointestinal: Negative.   Genitourinary: Negative.   Neurological: Negative.   Endo/Heme/Allergies: Negative.     Blood pressure 145/112, pulse 85, temperature 97.7 F (36.5 C), temperature source Oral, resp. rate 18, height _0  (1.702 m), weight 66.225 kg (146 lb), SpO2 98 %. Physical Exam right middle finger with subluxing and dislocating EDC tendon. Chest pain of the MCP joint and swelling associated synovitis. She has no evidence of infection or dystrophy. The patient is alert and oriented in no acute distress. The patient complains of pain in the affected upper extremity.  The patient is noted to have a normal HEENT exam. Lung fields show equal chest expansion and no shortness of breath. Abdomen exam is nontender without distention. Lower extremity examination does not show any fracture dislocation or blood clot symptoms. Pelvis is stable and the neck and back are stable and nontender. Assessment/Plan We will plan for right hand E DC stabilization with repair reconstruction is necessary. She understands risk and benefits and desires to proceed.  We are planning surgery for your upper extremity. The risk and benefits of surgery to include risk of bleeding, infection, anesthesia,  damage to normal structures and failure of the surgery to accomplish its intended goals of relieving symptoms and restoring function have been discussed in detail. With this in mind we plan to proceed. I have specifically discussed with the patient the pre-and postoperative regime and the dos and don'ts and risk and benefits in great detail. Risk and benefits of surgery also include risk of dystrophy(CRPS), chronic nerve pain, failure of the healing process to go onto completion and other inherent risks of surgery The relavent the pathophysiology of the disease/injury process, as well as the  alternatives for treatment and postoperative course of action has been discussed in great detail with the patient who desires to proceed.  We will do everything in our power to help you (the patient) restore function to the upper extremity. It is a pleasure to see this patient today.   Maclane Holloran III,Armari Fussell M 01/20/2015, 1:08 PM

## 2015-01-23 ENCOUNTER — Encounter (HOSPITAL_COMMUNITY): Payer: Self-pay | Admitting: Orthopedic Surgery

## 2015-02-16 DIAGNOSIS — M25641 Stiffness of right hand, not elsewhere classified: Secondary | ICD-10-CM | POA: Diagnosis not present

## 2015-02-20 DIAGNOSIS — M25641 Stiffness of right hand, not elsewhere classified: Secondary | ICD-10-CM | POA: Diagnosis not present

## 2015-03-02 DIAGNOSIS — M25641 Stiffness of right hand, not elsewhere classified: Secondary | ICD-10-CM | POA: Diagnosis not present

## 2015-03-12 DIAGNOSIS — J449 Chronic obstructive pulmonary disease, unspecified: Secondary | ICD-10-CM | POA: Diagnosis not present

## 2015-03-12 DIAGNOSIS — J069 Acute upper respiratory infection, unspecified: Secondary | ICD-10-CM | POA: Diagnosis not present

## 2015-03-12 DIAGNOSIS — I10 Essential (primary) hypertension: Secondary | ICD-10-CM | POA: Diagnosis not present

## 2015-03-12 DIAGNOSIS — J029 Acute pharyngitis, unspecified: Secondary | ICD-10-CM | POA: Diagnosis not present

## 2015-03-12 DIAGNOSIS — F17210 Nicotine dependence, cigarettes, uncomplicated: Secondary | ICD-10-CM | POA: Diagnosis not present

## 2015-03-12 DIAGNOSIS — N390 Urinary tract infection, site not specified: Secondary | ICD-10-CM | POA: Diagnosis not present

## 2015-03-12 DIAGNOSIS — E1140 Type 2 diabetes mellitus with diabetic neuropathy, unspecified: Secondary | ICD-10-CM | POA: Diagnosis not present

## 2015-03-13 DIAGNOSIS — M25641 Stiffness of right hand, not elsewhere classified: Secondary | ICD-10-CM | POA: Diagnosis not present

## 2015-03-29 DIAGNOSIS — K219 Gastro-esophageal reflux disease without esophagitis: Secondary | ICD-10-CM | POA: Diagnosis not present

## 2015-03-29 DIAGNOSIS — Z23 Encounter for immunization: Secondary | ICD-10-CM | POA: Diagnosis not present

## 2015-03-29 DIAGNOSIS — N390 Urinary tract infection, site not specified: Secondary | ICD-10-CM | POA: Diagnosis not present

## 2015-03-29 DIAGNOSIS — I10 Essential (primary) hypertension: Secondary | ICD-10-CM | POA: Diagnosis not present

## 2015-03-29 DIAGNOSIS — E1165 Type 2 diabetes mellitus with hyperglycemia: Secondary | ICD-10-CM | POA: Diagnosis not present

## 2015-03-29 DIAGNOSIS — J449 Chronic obstructive pulmonary disease, unspecified: Secondary | ICD-10-CM | POA: Diagnosis not present

## 2015-04-26 DIAGNOSIS — Z4789 Encounter for other orthopedic aftercare: Secondary | ICD-10-CM | POA: Diagnosis not present

## 2015-04-26 DIAGNOSIS — S66811D Strain of other specified muscles, fascia and tendons at wrist and hand level, right hand, subsequent encounter: Secondary | ICD-10-CM | POA: Diagnosis not present

## 2015-05-21 ENCOUNTER — Emergency Department: Payer: Commercial Managed Care - HMO

## 2015-05-21 ENCOUNTER — Emergency Department
Admission: EM | Admit: 2015-05-21 | Discharge: 2015-05-21 | Disposition: A | Discharge status: Home / Self Care | Payer: Commercial Managed Care - HMO | Attending: Emergency Medicine | Admitting: Emergency Medicine

## 2015-05-21 DIAGNOSIS — Z79899 Other long term (current) drug therapy: Secondary | ICD-10-CM | POA: Insufficient documentation

## 2015-05-21 DIAGNOSIS — N390 Urinary tract infection, site not specified: Secondary | ICD-10-CM | POA: Insufficient documentation

## 2015-05-21 DIAGNOSIS — J069 Acute upper respiratory infection, unspecified: Secondary | ICD-10-CM

## 2015-05-21 DIAGNOSIS — J449 Chronic obstructive pulmonary disease, unspecified: Secondary | ICD-10-CM | POA: Insufficient documentation

## 2015-05-21 DIAGNOSIS — E119 Type 2 diabetes mellitus without complications: Secondary | ICD-10-CM | POA: Diagnosis not present

## 2015-05-21 DIAGNOSIS — R05 Cough: Secondary | ICD-10-CM | POA: Diagnosis not present

## 2015-05-21 DIAGNOSIS — F17210 Nicotine dependence, cigarettes, uncomplicated: Secondary | ICD-10-CM | POA: Diagnosis not present

## 2015-05-21 DIAGNOSIS — Z7984 Long term (current) use of oral hypoglycemic drugs: Secondary | ICD-10-CM | POA: Insufficient documentation

## 2015-05-21 DIAGNOSIS — Z7982 Long term (current) use of aspirin: Secondary | ICD-10-CM | POA: Diagnosis not present

## 2015-05-21 LAB — URINALYSIS COMPLETE WITH MICROSCOPIC (ARMC ONLY)
Bilirubin Urine: NEGATIVE
Glucose, UA: >500 mg/dL — AB
Hgb urine dipstick: NEGATIVE
NITRITE: NEGATIVE
PROTEIN: 30 mg/dL — AB
Specific Gravity, Urine: 1.027 (ref 1.005–1.030)
pH: 5 (ref 5.0–8.0)

## 2015-05-21 MED ORDER — KETOROLAC TROMETHAMINE 10 MG PO TABS: 10.0000 mg | ORAL_TABLET | Freq: Four times a day (QID) | ORAL | Status: DC | PRN

## 2015-05-21 MED ORDER — KETOROLAC TROMETHAMINE 60 MG/2ML IM SOLN
60.0000 mg | Freq: Once | INTRAMUSCULAR | Status: AC
  Administered 2015-05-21: 60 mg via INTRAMUSCULAR
  Filled 2015-05-21: qty 2

## 2015-05-21 MED ORDER — HYDROCOD POLST-CPM POLST ER 10-8 MG/5ML PO SUER: 5.0000 mL | Freq: Two times a day (BID) | ORAL | Status: DC

## 2015-05-21 MED ORDER — HYDROCOD POLST-CPM POLST ER 10-8 MG/5ML PO SUER
5.0000 mL | Freq: Once | ORAL | Status: AC
Start: 2015-05-21 — End: 2015-05-21
  Administered 2015-05-21: 5 mL via ORAL
  Filled 2015-05-21: qty 5

## 2015-05-21 MED ORDER — SULFAMETHOXAZOLE-TRIMETHOPRIM 800-160 MG PO TABS: 1.0000 | ORAL_TABLET | Freq: Two times a day (BID) | ORAL | Status: DC

## 2015-05-21 NOTE — ED Notes (Signed)
Pt in with cold symptoms since Friday, bodyaches, chills, cough, sinus pressure,, headache.

## 2015-05-21 NOTE — ED Provider Notes (Signed)
[facility] Emergency Department Provider Note  ____________________________________________  Time seen: Approximately 9:18 PM  I have reviewed the triage vital signs and the nursing notes.   HISTORY  Chief Complaint Cough    HPI Alexandra Yang is a 70 y.o. female patient complaining of 3 days of bodyaches chills cough sinus pressure and frontal headache. Patient denies any nausea vomiting diarrhea. Patient states she's taking a flu shot this season. Patient said her cough is productive activity clear and yellow. Patient also complaining of back pain and is concerned secondary to history of recurrent UTIs. Patient denies any vaginal discharge. No palliative measures taken for this complaint.Patient currently smokes daily.   Past Medical History  Diagnosis Date  . Diabetes mellitus without complication     type 2  . COPD (chronic obstructive pulmonary disease)   . Pneumonia   . History of kidney infection     had to have PICC line  . GERD (gastroesophageal reflux disease)   . Neuropathy     both feet  . Arthritis     osteoarthritis  . Osteopenia   . Osteoporosis     There are no active problems to display for this patient.   Past Surgical History  Procedure Laterality Date  . Abdominal hysterectomy    . Cholecystectomy    . Knee arthroscopy Right   . Tendon transfer Right 01/20/2015    Procedure: RIGHT HAND AND MIDDLE FINGER TENDON RECONSTRUCTION AND TRANSFER;  Surgeon: Roseanne Kaufman, MD;  Location: [place];  Service: Orthopedics;  Laterality: Right;    Current Outpatient Rx  Name  Route  Sig  Dispense  Refill  . aspirin 81 MG chewable tablet   Oral   Chew 81 mg by mouth every evening.          . chlorpheniramine-HYDROcodone (TUSSIONEX PENNKINETIC ER) 10-8 MG/5ML SUER   Oral   Take 5 mLs by mouth 2 (two) times daily.   115 mL   0   . cholecalciferol (VITAMIN D) 1000 UNITS tablet   Oral   Take 1,000 Units by mouth daily.          Marland Kitchen gabapentin (NEURONTIN) 300 MG capsule   Oral   Take 300 mg by mouth at bedtime as needed (pain).         Marland Kitchen HYDROcodone-acetaminophen (NORCO) 5-325 MG per tablet   Oral   Take 2 tablets by mouth every 6 (six) hours as needed for moderate pain.   45 tablet   0   . HYDROcodone-acetaminophen (NORCO/VICODIN) 5-325 MG per tablet   Oral   Take 0.5 tablets by mouth every 4 (four) hours as needed.      0   . ketorolac (TORADOL) 10 MG tablet   Oral   Take 1 tablet (10 mg total) by mouth every 6 (six) hours as needed.   20 tablet   0   . lisinopril (PRINIVIL,ZESTRIL) 2.5 MG tablet   Oral   Take 2.5 mg by mouth daily.         . meloxicam (MOBIC) 7.5 MG tablet   Oral   Take 7.5 mg by mouth daily as needed for pain.         . metFORMIN (GLUMETZA) 1000 MG (MOD) 24 hr tablet   Oral   Take 1,000 mg by mouth daily with breakfast.         . metoCLOPramide (REGLAN) 10 MG tablet   Oral   Take 20 mg by mouth daily.         Marland Kitchen  Multiple Vitamins-Minerals (CENTRUM SILVER PO)   Oral   Take 1 tablet by mouth daily.         . pregabalin (LYRICA) 50 MG capsule   Oral   Take 50 mg by mouth at bedtime as needed (pain).         . simvastatin (ZOCOR) 40 MG tablet   Oral   Take 40 mg by mouth at bedtime.         . sulfamethoxazole-trimethoprim (BACTRIM DS,SEPTRA DS) 800-160 MG tablet   Oral   Take 1 tablet by mouth 2 (two) times daily.   20 tablet   0   . VICTOZA 18 MG/3ML SOPN   Subcutaneous   Inject 1.8 Units into the skin every morning.      6     Dispense as written.   . vitamin B-12 (CYANOCOBALAMIN) 1000 MCG tablet   Oral   Take 3,000 mcg by mouth daily.           Allergies Percodan; Ciprofloxacin; Naproxen; and Ultram  Family History  Problem Relation Age of Onset  . Pancreatic cancer Mother   . Lung cancer Father     Social History Social History  Substance Use Topics  . Smoking status: Current Every Day Smoker -- 0.25 packs/day    Types:  Cigarettes  . Smokeless tobacco: Never Used  . Alcohol Use: Yes     Comment: occasional    Review of Systems Constitutional: Fever and chills and body aches. Eyes: No visual changes. ENT: No sore throat. Cardiovascular: Denies chest pain. Respiratory: Denies shortness of breath. Productive cough Gastrointestinal: No abdominal pain.  No nausea, no vomiting.  No diarrhea.  No constipation. Genitourinary: Negative for dysuria. Positive for urinary frequency Musculoskeletal: Positive for flank pain. Skin: Negative for rash. Neurological: Negative for headaches, focal weakness or numbness. Endocrine:Diabetes Allergic/Immunilogical: See medication list    ____________________________________________   PHYSICAL EXAM:  VITAL SIGNS: ED Triage Vitals  Enc Vitals Group     BP 05/21/15 1959 134/60 mmHg     Pulse Rate 05/21/15 1959 108     Resp 05/21/15 1959 18     Temp 05/21/15 1959 97.7 F (36.5 C)     Temp Source 05/21/15 1959 Oral     SpO2 05/21/15 1959 95 %     Weight 05/21/15 1959 146 lb (66.225 kg)     Height 05/21/15 1959 5\' 7"  (1.702 m)     Head Cir --      Peak Flow --      Pain Score 05/21/15 2000 7     Pain Loc --      Pain Edu? --      Excl. in [place]? --     Constitutional: Alert and oriented. Appears malaise Eyes: Conjunctivae are normal. PERRL. EOMI. Head: Atraumatic. Nose: Edematous nasal turbinates with bilateral maxillary guarding.  Mouth/Throat: Mucous membranes are moist.  Oropharynx non-erythematous. Postnasal drainage Neck: No stridor. No cervical spine tenderness to palpation. Hematological/Lymphatic/Immunilogical: No cervical lymphadenopathy. Cardiovascular: Normal rate, regular rhythm. Grossly normal heart sounds.  Good peripheral circulation. Respiratory: Normal respiratory effort.  No retractions. Lungs CTAB. Gastrointestinal: Soft and nontender. No distention. No abdominal bruits. No CVA tenderness. Musculoskeletal: No lower extremity tenderness  nor edema.  No joint effusions. Neurologic:  Normal speech and language. No gross focal neurologic deficits are appreciated. No gait instability. Skin:  Skin is warm, dry and intact. No rash noted. Psychiatric: Mood and affect are normal. Speech and behavior are normal.  ____________________________________________  LABS (all labs ordered are listed, but only abnormal results are displayed)  Labs Reviewed  URINALYSIS COMPLETEWITH MICROSCOPIC ([facility] ONLY) - Abnormal; Notable for the following:    Color, Urine YELLOW (*)    APPearance CLOUDY (*)    Glucose, UA >500 (*)    Ketones, ur TRACE (*)    Protein, ur 30 (*)    Leukocytes, UA 3+ (*)    Bacteria, UA RARE (*)    Squamous Epithelial / LPF 6-30 (*)    All other components within normal limits   ____________________________________________  EKG   ____________________________________________  RADIOLOGY  No acute findings on x-ray. Findings consistent with chronic COPD. ____________________________________________   PROCEDURES  Procedure(s) performed: None  Critical Care performed: No  ____________________________________________   INITIAL IMPRESSION / ASSESSMENT AND PLAN / ED COURSE  Pertinent labs & imaging results that were available during my care of the patient were reviewed by me and considered in my medical decision making (see chart for details).  Upper respiratory infection and urinary tract infection. Discussed x-ray and lab results with patient. Patient get a prescription for Bactrim DS, Toradol, and Tussionex. Patient advised to follow-up with family doctor if condition persists. ____________________________________________   FINAL CLINICAL IMPRESSION(S) / ED DIAGNOSES  Final diagnoses:  URI (upper respiratory infection)  UTI (lower urinary tract infection)      Sable Feil, PA-C 05/21/15 2250  Hinda Kehr, MD 05/21/15 2329

## 2015-05-24 DIAGNOSIS — N390 Urinary tract infection, site not specified: Secondary | ICD-10-CM | POA: Diagnosis not present

## 2015-05-24 DIAGNOSIS — J069 Acute upper respiratory infection, unspecified: Secondary | ICD-10-CM | POA: Diagnosis not present

## 2015-05-31 DIAGNOSIS — J441 Chronic obstructive pulmonary disease with (acute) exacerbation: Secondary | ICD-10-CM | POA: Diagnosis not present

## 2015-05-31 DIAGNOSIS — J069 Acute upper respiratory infection, unspecified: Secondary | ICD-10-CM | POA: Diagnosis not present

## 2015-05-31 DIAGNOSIS — R300 Dysuria: Secondary | ICD-10-CM | POA: Diagnosis not present

## 2015-05-31 DIAGNOSIS — I10 Essential (primary) hypertension: Secondary | ICD-10-CM | POA: Diagnosis not present

## 2015-05-31 DIAGNOSIS — E1165 Type 2 diabetes mellitus with hyperglycemia: Secondary | ICD-10-CM | POA: Diagnosis not present

## 2015-05-31 DIAGNOSIS — F17210 Nicotine dependence, cigarettes, uncomplicated: Secondary | ICD-10-CM | POA: Diagnosis not present

## 2015-06-21 DIAGNOSIS — Z0001 Encounter for general adult medical examination with abnormal findings: Secondary | ICD-10-CM | POA: Diagnosis not present

## 2015-06-21 DIAGNOSIS — F17210 Nicotine dependence, cigarettes, uncomplicated: Secondary | ICD-10-CM | POA: Diagnosis not present

## 2015-06-21 DIAGNOSIS — M150 Primary generalized (osteo)arthritis: Secondary | ICD-10-CM | POA: Diagnosis not present

## 2015-06-21 DIAGNOSIS — J449 Chronic obstructive pulmonary disease, unspecified: Secondary | ICD-10-CM | POA: Diagnosis not present

## 2015-06-21 DIAGNOSIS — E782 Mixed hyperlipidemia: Secondary | ICD-10-CM | POA: Diagnosis not present

## 2015-06-21 DIAGNOSIS — E1140 Type 2 diabetes mellitus with diabetic neuropathy, unspecified: Secondary | ICD-10-CM | POA: Diagnosis not present

## 2015-06-21 DIAGNOSIS — I10 Essential (primary) hypertension: Secondary | ICD-10-CM | POA: Diagnosis not present

## 2015-06-27 ENCOUNTER — Other Ambulatory Visit: Payer: Self-pay | Admitting: Nurse Practitioner

## 2015-06-27 DIAGNOSIS — Z1231 Encounter for screening mammogram for malignant neoplasm of breast: Secondary | ICD-10-CM

## 2015-07-10 ENCOUNTER — Other Ambulatory Visit
Admission: RE | Admit: 2015-07-10 | Discharge: 2015-07-10 | Disposition: A | Discharge status: Home / Self Care | Payer: Commercial Managed Care - HMO | Source: Ambulatory Visit | Attending: Nurse Practitioner | Admitting: Nurse Practitioner

## 2015-07-10 ENCOUNTER — Ambulatory Visit
Admission: RE | Admit: 2015-07-10 | Discharge: 2015-07-10 | Disposition: A | Discharge status: Home / Self Care | Payer: Commercial Managed Care - HMO | Source: Ambulatory Visit | Attending: Nurse Practitioner | Admitting: Nurse Practitioner

## 2015-07-10 ENCOUNTER — Other Ambulatory Visit: Payer: Self-pay | Admitting: Nurse Practitioner

## 2015-07-10 DIAGNOSIS — Z1231 Encounter for screening mammogram for malignant neoplasm of breast: Secondary | ICD-10-CM

## 2015-07-10 DIAGNOSIS — E119 Type 2 diabetes mellitus without complications: Secondary | ICD-10-CM | POA: Insufficient documentation

## 2015-07-10 DIAGNOSIS — E785 Hyperlipidemia, unspecified: Secondary | ICD-10-CM | POA: Diagnosis not present

## 2015-07-10 HISTORY — DX: Malignant (primary) neoplasm, unspecified: C80.1

## 2015-07-10 LAB — TSH: TSH: 1.057 u[IU]/mL (ref 0.350–4.500)

## 2015-07-10 LAB — LIPID PANEL
Cholesterol: 126 mg/dL (ref 0–200)
HDL: 45 mg/dL (ref >40)
LDL Cholesterol: 70 mg/dL (ref 0–99)
Total CHOL/HDL Ratio: 2.8 RATIO
Triglycerides: 55 mg/dL (ref <150)
VLDL: 11 mg/dL (ref 0–40)

## 2015-07-10 LAB — COMPREHENSIVE METABOLIC PANEL
ALBUMIN: 4.4 g/dL (ref 3.5–5.0)
ALK PHOS: 75 U/L (ref 38–126)
ALT: 19 U/L (ref 14–54)
ANION GAP: 5 (ref 5–15)
AST: 20 U/L (ref 15–41)
BILIRUBIN TOTAL: 0.4 mg/dL (ref 0.3–1.2)
BUN: 25 mg/dL — ABNORMAL HIGH (ref 6–20)
CALCIUM: 9.8 mg/dL (ref 8.9–10.3)
CO2: 27 mmol/L (ref 22–32)
CREATININE: 0.58 mg/dL (ref 0.44–1.00)
Chloride: 107 mmol/L (ref 101–111)
GFR calc non Af Amer: >60 mL/min (ref >60)
GLUCOSE: 153 mg/dL — AB (ref 65–99)
Potassium: 4.8 mmol/L (ref 3.5–5.1)
Sodium: 139 mmol/L (ref 135–145)
TOTAL PROTEIN: 7.6 g/dL (ref 6.5–8.1)

## 2015-07-10 LAB — T4, FREE: FREE T4: 0.99 ng/dL (ref 0.61–1.12)

## 2015-07-10 LAB — CBC
HEMATOCRIT: 39.8 % (ref 35.0–47.0)
HEMOGLOBIN: 13.3 g/dL (ref 12.0–16.0)
MCH: 30.3 pg (ref 26.0–34.0)
MCHC: 33.4 g/dL (ref 32.0–36.0)
MCV: 90.8 fL (ref 80.0–100.0)
Platelets: 234 10*3/uL (ref 150–440)
RBC: 4.38 MIL/uL (ref 3.80–5.20)
RDW: 13.1 % (ref 11.5–14.5)
WBC: 7.7 10*3/uL (ref 3.6–11.0)

## 2015-07-11 LAB — MICROALBUMIN, URINE: Microalb, Ur: 25.7 ug/mL — ABNORMAL HIGH

## 2015-08-05 DIAGNOSIS — M799 Soft tissue disorder, unspecified: Secondary | ICD-10-CM | POA: Diagnosis not present

## 2015-08-05 DIAGNOSIS — E7800 Pure hypercholesterolemia, unspecified: Secondary | ICD-10-CM | POA: Diagnosis not present

## 2015-08-05 DIAGNOSIS — E785 Hyperlipidemia, unspecified: Secondary | ICD-10-CM | POA: Diagnosis not present

## 2015-08-05 DIAGNOSIS — Z881 Allergy status to other antibiotic agents status: Secondary | ICD-10-CM | POA: Diagnosis not present

## 2015-08-05 DIAGNOSIS — S61219A Laceration without foreign body of unspecified finger without damage to nail, initial encounter: Secondary | ICD-10-CM | POA: Diagnosis not present

## 2015-08-05 DIAGNOSIS — Z885 Allergy status to narcotic agent status: Secondary | ICD-10-CM | POA: Diagnosis not present

## 2015-08-05 DIAGNOSIS — E119 Type 2 diabetes mellitus without complications: Secondary | ICD-10-CM | POA: Diagnosis not present

## 2015-08-05 DIAGNOSIS — I10 Essential (primary) hypertension: Secondary | ICD-10-CM | POA: Diagnosis not present

## 2015-08-05 DIAGNOSIS — J449 Chronic obstructive pulmonary disease, unspecified: Secondary | ICD-10-CM | POA: Diagnosis not present

## 2015-08-05 DIAGNOSIS — S61213A Laceration without foreign body of left middle finger without damage to nail, initial encounter: Secondary | ICD-10-CM | POA: Diagnosis not present

## 2015-08-05 DIAGNOSIS — G629 Polyneuropathy, unspecified: Secondary | ICD-10-CM | POA: Diagnosis not present

## 2015-09-07 DIAGNOSIS — E1142 Type 2 diabetes mellitus with diabetic polyneuropathy: Secondary | ICD-10-CM | POA: Diagnosis not present

## 2015-09-07 DIAGNOSIS — E7800 Pure hypercholesterolemia, unspecified: Secondary | ICD-10-CM | POA: Diagnosis not present

## 2015-09-07 DIAGNOSIS — Z79899 Other long term (current) drug therapy: Secondary | ICD-10-CM | POA: Diagnosis not present

## 2015-09-07 DIAGNOSIS — I10 Essential (primary) hypertension: Secondary | ICD-10-CM | POA: Diagnosis not present

## 2015-09-07 DIAGNOSIS — Z881 Allergy status to other antibiotic agents status: Secondary | ICD-10-CM | POA: Diagnosis not present

## 2015-09-07 DIAGNOSIS — Z7984 Long term (current) use of oral hypoglycemic drugs: Secondary | ICD-10-CM | POA: Diagnosis not present

## 2015-09-07 DIAGNOSIS — L02213 Cutaneous abscess of chest wall: Secondary | ICD-10-CM | POA: Diagnosis not present

## 2015-09-07 DIAGNOSIS — J449 Chronic obstructive pulmonary disease, unspecified: Secondary | ICD-10-CM | POA: Diagnosis not present

## 2015-09-07 DIAGNOSIS — Z7982 Long term (current) use of aspirin: Secondary | ICD-10-CM | POA: Diagnosis not present

## 2015-09-07 DIAGNOSIS — B029 Zoster without complications: Secondary | ICD-10-CM | POA: Diagnosis not present

## 2015-09-11 DIAGNOSIS — B029 Zoster without complications: Secondary | ICD-10-CM | POA: Diagnosis not present

## 2015-09-25 DIAGNOSIS — B029 Zoster without complications: Secondary | ICD-10-CM | POA: Diagnosis not present

## 2015-09-25 DIAGNOSIS — E1165 Type 2 diabetes mellitus with hyperglycemia: Secondary | ICD-10-CM | POA: Diagnosis not present

## 2015-09-25 DIAGNOSIS — F17210 Nicotine dependence, cigarettes, uncomplicated: Secondary | ICD-10-CM | POA: Diagnosis not present

## 2015-09-25 DIAGNOSIS — J449 Chronic obstructive pulmonary disease, unspecified: Secondary | ICD-10-CM | POA: Diagnosis not present

## 2015-09-25 DIAGNOSIS — I10 Essential (primary) hypertension: Secondary | ICD-10-CM | POA: Diagnosis not present

## 2015-12-13 DIAGNOSIS — H2513 Age-related nuclear cataract, bilateral: Secondary | ICD-10-CM | POA: Diagnosis not present

## 2015-12-13 DIAGNOSIS — E119 Type 2 diabetes mellitus without complications: Secondary | ICD-10-CM | POA: Diagnosis not present

## 2015-12-25 DIAGNOSIS — I10 Essential (primary) hypertension: Secondary | ICD-10-CM | POA: Diagnosis not present

## 2015-12-25 DIAGNOSIS — J449 Chronic obstructive pulmonary disease, unspecified: Secondary | ICD-10-CM | POA: Diagnosis not present

## 2015-12-25 DIAGNOSIS — F17210 Nicotine dependence, cigarettes, uncomplicated: Secondary | ICD-10-CM | POA: Diagnosis not present

## 2015-12-25 DIAGNOSIS — E1140 Type 2 diabetes mellitus with diabetic neuropathy, unspecified: Secondary | ICD-10-CM | POA: Diagnosis not present

## 2015-12-25 DIAGNOSIS — M150 Primary generalized (osteo)arthritis: Secondary | ICD-10-CM | POA: Diagnosis not present

## 2016-03-21 DIAGNOSIS — N390 Urinary tract infection, site not specified: Secondary | ICD-10-CM | POA: Diagnosis not present

## 2016-03-21 DIAGNOSIS — F17210 Nicotine dependence, cigarettes, uncomplicated: Secondary | ICD-10-CM | POA: Diagnosis not present

## 2016-03-21 DIAGNOSIS — E1165 Type 2 diabetes mellitus with hyperglycemia: Secondary | ICD-10-CM | POA: Diagnosis not present

## 2016-03-21 DIAGNOSIS — R05 Cough: Secondary | ICD-10-CM | POA: Diagnosis not present

## 2016-03-21 DIAGNOSIS — J449 Chronic obstructive pulmonary disease, unspecified: Secondary | ICD-10-CM | POA: Diagnosis not present

## 2016-03-21 DIAGNOSIS — J069 Acute upper respiratory infection, unspecified: Secondary | ICD-10-CM | POA: Diagnosis not present

## 2016-03-25 ENCOUNTER — Ambulatory Visit
Admission: RE | Admit: 2016-03-25 | Discharge: 2016-03-25 | Disposition: A | Discharge status: Home / Self Care | Payer: Commercial Managed Care - HMO | Source: Ambulatory Visit | Attending: Nurse Practitioner | Admitting: Nurse Practitioner

## 2016-03-25 ENCOUNTER — Other Ambulatory Visit
Admission: RE | Admit: 2016-03-25 | Discharge: 2016-03-25 | Disposition: A | Discharge status: Home / Self Care | Payer: Commercial Managed Care - HMO | Source: Ambulatory Visit | Attending: Nurse Practitioner | Admitting: Nurse Practitioner

## 2016-03-25 ENCOUNTER — Other Ambulatory Visit: Payer: Self-pay | Admitting: Nurse Practitioner

## 2016-03-25 DIAGNOSIS — Z87442 Personal history of urinary calculi: Secondary | ICD-10-CM | POA: Diagnosis not present

## 2016-03-25 DIAGNOSIS — Z9049 Acquired absence of other specified parts of digestive tract: Secondary | ICD-10-CM | POA: Diagnosis not present

## 2016-03-25 DIAGNOSIS — R1030 Lower abdominal pain, unspecified: Secondary | ICD-10-CM | POA: Insufficient documentation

## 2016-03-25 DIAGNOSIS — E1165 Type 2 diabetes mellitus with hyperglycemia: Secondary | ICD-10-CM | POA: Diagnosis not present

## 2016-03-25 DIAGNOSIS — R52 Pain, unspecified: Secondary | ICD-10-CM

## 2016-03-25 DIAGNOSIS — R5383 Other fatigue: Secondary | ICD-10-CM | POA: Diagnosis not present

## 2016-03-25 DIAGNOSIS — N390 Urinary tract infection, site not specified: Secondary | ICD-10-CM | POA: Diagnosis not present

## 2016-03-25 DIAGNOSIS — J449 Chronic obstructive pulmonary disease, unspecified: Secondary | ICD-10-CM | POA: Diagnosis not present

## 2016-03-25 DIAGNOSIS — R5381 Other malaise: Secondary | ICD-10-CM | POA: Insufficient documentation

## 2016-03-25 DIAGNOSIS — B999 Unspecified infectious disease: Secondary | ICD-10-CM | POA: Insufficient documentation

## 2016-03-25 DIAGNOSIS — J069 Acute upper respiratory infection, unspecified: Secondary | ICD-10-CM | POA: Diagnosis not present

## 2016-03-25 DIAGNOSIS — I10 Essential (primary) hypertension: Secondary | ICD-10-CM | POA: Diagnosis not present

## 2016-03-25 DIAGNOSIS — N200 Calculus of kidney: Secondary | ICD-10-CM | POA: Diagnosis present

## 2016-03-25 DIAGNOSIS — F17210 Nicotine dependence, cigarettes, uncomplicated: Secondary | ICD-10-CM | POA: Diagnosis not present

## 2016-03-25 LAB — BASIC METABOLIC PANEL
Anion gap: 9 (ref 5–15)
BUN: 17 mg/dL (ref 6–20)
CHLORIDE: 105 mmol/L (ref 101–111)
CO2: 25 mmol/L (ref 22–32)
Calcium: 10.4 mg/dL — ABNORMAL HIGH (ref 8.9–10.3)
Creatinine, Ser: 0.64 mg/dL (ref 0.44–1.00)
Glucose, Bld: 113 mg/dL — ABNORMAL HIGH (ref 65–99)
POTASSIUM: 4.5 mmol/L (ref 3.5–5.1)
SODIUM: 139 mmol/L (ref 135–145)

## 2016-03-25 LAB — CBC
HCT: 42.6 % (ref 35.0–47.0)
HEMOGLOBIN: 14.5 g/dL (ref 12.0–16.0)
MCH: 31.3 pg (ref 26.0–34.0)
MCHC: 34 g/dL (ref 32.0–36.0)
MCV: 92.1 fL (ref 80.0–100.0)
PLATELETS: 258 10*3/uL (ref 150–440)
RBC: 4.63 MIL/uL (ref 3.80–5.20)
RDW: 13.4 % (ref 11.5–14.5)
WBC: 8.6 10*3/uL (ref 3.6–11.0)

## 2016-04-07 DIAGNOSIS — F17210 Nicotine dependence, cigarettes, uncomplicated: Secondary | ICD-10-CM | POA: Diagnosis not present

## 2016-04-07 DIAGNOSIS — J069 Acute upper respiratory infection, unspecified: Secondary | ICD-10-CM | POA: Diagnosis not present

## 2016-04-07 DIAGNOSIS — E1140 Type 2 diabetes mellitus with diabetic neuropathy, unspecified: Secondary | ICD-10-CM | POA: Diagnosis not present

## 2016-04-07 DIAGNOSIS — N390 Urinary tract infection, site not specified: Secondary | ICD-10-CM | POA: Diagnosis not present

## 2016-04-07 DIAGNOSIS — J449 Chronic obstructive pulmonary disease, unspecified: Secondary | ICD-10-CM | POA: Diagnosis not present

## 2016-04-07 DIAGNOSIS — I10 Essential (primary) hypertension: Secondary | ICD-10-CM | POA: Diagnosis not present

## 2016-04-07 DIAGNOSIS — Z23 Encounter for immunization: Secondary | ICD-10-CM | POA: Diagnosis not present

## 2016-06-05 ENCOUNTER — Emergency Department
Admission: EM | Admit: 2016-06-05 | Discharge: 2016-06-05 | Disposition: A | Discharge status: Home / Self Care | Payer: Medicare HMO | Attending: Emergency Medicine | Admitting: Emergency Medicine

## 2016-06-05 ENCOUNTER — Encounter: Payer: Self-pay | Admitting: Emergency Medicine

## 2016-06-05 DIAGNOSIS — J09X2 Influenza due to identified novel influenza A virus with other respiratory manifestations: Secondary | ICD-10-CM | POA: Insufficient documentation

## 2016-06-05 DIAGNOSIS — Z79899 Other long term (current) drug therapy: Secondary | ICD-10-CM | POA: Diagnosis not present

## 2016-06-05 DIAGNOSIS — J449 Chronic obstructive pulmonary disease, unspecified: Secondary | ICD-10-CM | POA: Diagnosis not present

## 2016-06-05 DIAGNOSIS — Z7982 Long term (current) use of aspirin: Secondary | ICD-10-CM | POA: Diagnosis not present

## 2016-06-05 DIAGNOSIS — R112 Nausea with vomiting, unspecified: Secondary | ICD-10-CM | POA: Diagnosis present

## 2016-06-05 DIAGNOSIS — F17210 Nicotine dependence, cigarettes, uncomplicated: Secondary | ICD-10-CM | POA: Insufficient documentation

## 2016-06-05 DIAGNOSIS — Z7984 Long term (current) use of oral hypoglycemic drugs: Secondary | ICD-10-CM | POA: Diagnosis not present

## 2016-06-05 DIAGNOSIS — N3090 Cystitis, unspecified without hematuria: Secondary | ICD-10-CM | POA: Diagnosis not present

## 2016-06-05 DIAGNOSIS — J101 Influenza due to other identified influenza virus with other respiratory manifestations: Secondary | ICD-10-CM

## 2016-06-05 DIAGNOSIS — E119 Type 2 diabetes mellitus without complications: Secondary | ICD-10-CM | POA: Insufficient documentation

## 2016-06-05 LAB — URINALYSIS, COMPLETE (UACMP) WITH MICROSCOPIC
Bacteria, UA: NONE SEEN
Bilirubin Urine: NEGATIVE
Glucose, UA: NEGATIVE mg/dL
Hgb urine dipstick: NEGATIVE
Ketones, ur: 5 mg/dL — AB
Nitrite: NEGATIVE
PH: 5 (ref 5.0–8.0)
Protein, ur: 30 mg/dL — AB
Specific Gravity, Urine: 1.026 (ref 1.005–1.030)

## 2016-06-05 LAB — CBC
HCT: 41.4 % (ref 35.0–47.0)
HEMOGLOBIN: 13.9 g/dL (ref 12.0–16.0)
MCH: 30.6 pg (ref 26.0–34.0)
MCHC: 33.5 g/dL (ref 32.0–36.0)
MCV: 91.4 fL (ref 80.0–100.0)
PLATELETS: 194 10*3/uL (ref 150–440)
RBC: 4.53 MIL/uL (ref 3.80–5.20)
RDW: 13.1 % (ref 11.5–14.5)
WBC: 6.1 10*3/uL (ref 3.6–11.0)

## 2016-06-05 LAB — COMPREHENSIVE METABOLIC PANEL
ALBUMIN: 4.2 g/dL (ref 3.5–5.0)
ALK PHOS: 69 U/L (ref 38–126)
ALT: 17 U/L (ref 14–54)
AST: 19 U/L (ref 15–41)
Anion gap: 8 (ref 5–15)
BUN: 17 mg/dL (ref 6–20)
CALCIUM: 9.7 mg/dL (ref 8.9–10.3)
CHLORIDE: 103 mmol/L (ref 101–111)
CO2: 24 mmol/L (ref 22–32)
CREATININE: 0.71 mg/dL (ref 0.44–1.00)
GFR calc Af Amer: >60 mL/min (ref >60)
GFR calc non Af Amer: >60 mL/min (ref >60)
GLUCOSE: 166 mg/dL — AB (ref 65–99)
Potassium: 4 mmol/L (ref 3.5–5.1)
Sodium: 135 mmol/L (ref 135–145)
Total Bilirubin: 0.6 mg/dL (ref 0.3–1.2)
Total Protein: 7.8 g/dL (ref 6.5–8.1)

## 2016-06-05 LAB — INFLUENZA PANEL BY PCR (TYPE A & B)
INFLAPCR: POSITIVE — AB
INFLBPCR: NEGATIVE

## 2016-06-05 LAB — LIPASE, BLOOD: LIPASE: 17 U/L (ref 11–51)

## 2016-06-05 MED ORDER — ONDANSETRON 4 MG PO TBDP: 4.0000 mg | ORAL_TABLET | Freq: Three times a day (TID) | ORAL | 0 refills | Status: DC | PRN

## 2016-06-05 MED ORDER — LEVOFLOXACIN 750 MG PO TABS: 750.0000 mg | ORAL_TABLET | Freq: Every day | ORAL | 0 refills | Status: DC

## 2016-06-05 MED ORDER — ONDANSETRON 4 MG PO TBDP
8.0000 mg | ORAL_TABLET | Freq: Once | ORAL | Status: AC
  Administered 2016-06-05: 8 mg via ORAL
  Filled 2016-06-05: qty 2

## 2016-06-05 MED ORDER — FAMOTIDINE 20 MG PO TABS: 20.0000 mg | ORAL_TABLET | Freq: Two times a day (BID) | ORAL | 0 refills | Status: DC

## 2016-06-05 NOTE — ED Triage Notes (Signed)
Pt presents to ED with c/o nausea, vomiting, chills, fever, back spasms, HA. Pt states she has not been eating. Pt also c/o generalized body aches since Monday. Pt states hx of ESBL but not at this time. Pt is alert and oriented at this time. Pt states "this is how my UTI's start as well". Pt states temperature at home was 100.3 and is now 98.8. Pt is alert and oriented at this time

## 2016-06-05 NOTE — ED Provider Notes (Signed)
[facility] Emergency Department Provider Note  ____________________________________________  Time seen: Approximately 7:49 PM  I have reviewed the triage vital signs and the nursing notes.   HISTORY  Chief Complaint Nausea and Emesis    HPI Alexandra Yang is a 71 y.o. female who complains of nausea vomiting chills fever or back specimens and generalized headache over the past 2-3 days. Has decreased oral intake as well. Gets lightheaded when she stands up. 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     Past Medical History:  Diagnosis Date  . Arthritis    osteoarthritis  . Cancer ([place]) 1995   endometrial ca  . COPD (chronic obstructive pulmonary disease) ([place])   . Diabetes mellitus without complication ([place])    type 2  . GERD (gastroesophageal reflux disease)   . History of kidney infection    had to have PICC line  . Neuropathy ([place])    both feet  . Osteopenia   . Osteoporosis   . Pneumonia      There are no active problems to display for this patient.    Past Surgical History:  Procedure Laterality Date  . ABDOMINAL HYSTERECTOMY    . CHOLECYSTECTOMY    . KNEE ARTHROSCOPY Right   . TENDON TRANSFER Right 01/20/2015   Procedure: RIGHT HAND AND MIDDLE FINGER TENDON RECONSTRUCTION AND TRANSFER;  Surgeon: Roseanne Kaufman, MD;  Location: [place];  Service: Orthopedics;  Laterality: Right;     Prior to Admission medications   Medication Sig Start Date End Date Taking? Authorizing Provider  aspirin 81 MG chewable tablet Chew 81 mg by mouth every evening.     Historical Provider, MD  chlorpheniramine-HYDROcodone (TUSSIONEX PENNKINETIC ER) 10-8 MG/5ML SUER Take 5 mLs by mouth 2 (two) times daily. 05/21/15   Sable Feil, PA-C  cholecalciferol (VITAMIN D) 1000 UNITS tablet Take 1,000 Units by mouth daily.    Historical Provider, MD  famotidine  (PEPCID) 20 MG tablet Take 1 tablet (20 mg total) by mouth 2 (two) times daily. 06/05/16   Carrie Mew, MD  gabapentin (NEURONTIN) 300 MG capsule Take 300 mg by mouth at bedtime as needed (pain).    Historical Provider, MD  HYDROcodone-acetaminophen (NORCO) 5-325 MG per tablet Take 2 tablets by mouth every 6 (six) hours as needed for moderate pain. 01/20/15   Roseanne Kaufman, MD  HYDROcodone-acetaminophen (NORCO/VICODIN) 5-325 MG per tablet Take 0.5 tablets by mouth every 4 (four) hours as needed. 12/18/14   Historical Provider, MD  ketorolac (TORADOL) 10 MG tablet Take 1 tablet (10 mg total) by mouth every 6 (six) hours as needed. 05/21/15   Sable Feil, PA-C  levofloxacin (LEVAQUIN) 750 MG tablet Take 1 tablet (750 mg total) by mouth daily. 06/05/16   Carrie Mew, MD  lisinopril (PRINIVIL,ZESTRIL) 2.5 MG tablet Take 2.5 mg by mouth daily.    Historical Provider, MD  meloxicam (MOBIC) 7.5 MG tablet Take 7.5 mg by mouth daily as needed for pain.    Historical Provider, MD  metFORMIN (GLUMETZA) 1000 MG (MOD) 24 hr tablet Take 1,000 mg by mouth daily with breakfast.    Historical Provider, MD  metoCLOPramide (REGLAN) 10 MG tablet Take 20 mg by mouth daily.    Historical Provider, MD  Multiple Vitamins-Minerals (CENTRUM SILVER PO) Take 1 tablet by mouth daily.    Historical Provider, MD  ondansetron (ZOFRAN ODT) 4 MG  disintegrating tablet Take 1 tablet (4 mg total) by mouth every 8 (eight) hours as needed for nausea or vomiting. 06/05/16   Carrie Mew, MD  pregabalin (LYRICA) 50 MG capsule Take 50 mg by mouth at bedtime as needed (pain).    Historical Provider, MD  simvastatin (ZOCOR) 40 MG tablet Take 40 mg by mouth at bedtime.    Historical Provider, MD  sulfamethoxazole-trimethoprim (BACTRIM DS,SEPTRA DS) 800-160 MG tablet Take 1 tablet by mouth 2 (two) times daily. 05/21/15   Sable Feil, PA-C  VICTOZA 18 MG/3ML SOPN Inject 1.8 Units into the skin every morning. 12/09/14   Historical  Provider, MD  vitamin B-12 (CYANOCOBALAMIN) 1000 MCG tablet Take 3,000 mcg by mouth daily.    Historical Provider, MD     Allergies Percodan [oxycodone-aspirin]; Ciprofloxacin; Naproxen; and Ultram [tramadol]   Family History  Problem Relation Age of Onset  . Pancreatic cancer Mother   . Lung cancer Father   . Breast cancer Sister 32    Social History Social History  Substance Use Topics  . Smoking status: Current Every Day Smoker    Packs/day: 0.25    Types: Cigarettes  . Smokeless tobacco: Never Used  . Alcohol use Yes     Comment: occasional    Review of Systems  Constitutional:   Positive fever and chills.  ENT:  Positive sore throat. Cardiovascular:   No chest pain. Respiratory:   No dyspnea positive cough. Gastrointestinal:   Negative for abdominal pain, positive vomiting and diarrhea.  Genitourinary:  Positive dysuria. Musculoskeletal:   Negative for focal pain or swelling. Positive diffuse myalgia Neurological:   Positive for headaches 10-point ROS otherwise negative.  ____________________________________________   PHYSICAL EXAM:  VITAL SIGNS: ED Triage Vitals  Enc Vitals Group     BP 06/05/16 1447 105/81     Pulse Rate 06/05/16 1447 (!) 110     Resp 06/05/16 1447 20     Temp 06/05/16 1447 98.8 F (37.1 C)     Temp Source 06/05/16 1447 Oral     SpO2 06/05/16 1447 95 %     Weight 06/05/16 1449 138 lb (62.6 kg)     Height 06/05/16 1449 5\' 7"  (1.702 m)     Head Circumference --      Peak Flow --      Pain Score 06/05/16 1457 7     Pain Loc --      Pain Edu? --      Excl. in [place]? --     Vital signs reviewed, nursing assessments reviewed.   Constitutional:   Alert and oriented. Well appearing and in no distress. Eyes:   No scleral icterus. No conjunctival pallor. PERRL. EOMI.  No nystagmus. ENT   Head:   Normocephalic and atraumatic.   Nose:   No congestion/rhinnorhea. No septal hematoma   Mouth/Throat:   MMM, no pharyngeal erythema.  No peritonsillar mass.    Neck:   No stridor. No SubQ emphysema. No meningismus. Hematological/Lymphatic/Immunilogical:   No cervical lymphadenopathy. Cardiovascular:   Tachycardia heart rate 100. Symmetric bilateral radial and DP pulses.  No murmurs.  Respiratory:   Normal respiratory effort without tachypnea nor retractions. Breath sounds are clear and equal bilaterally. No wheezes/rales/rhonchi. Gastrointestinal:   Soft and nontender. Non distended. There is no CVA tenderness.  No rebound, rigidity, or guarding. Genitourinary:   deferred Musculoskeletal:   Nontender with normal range of motion in all extremities. No joint effusions.  No lower extremity tenderness.  No edema.  Neurologic:   Normal speech and language.  CN 2-10 normal. Motor grossly intact. No gross focal neurologic deficits are appreciated.  Skin:    Skin is warm, dry and intact. No rash noted.  No petechiae, purpura, or bullae.  ____________________________________________    LABS (pertinent positives/negatives) (all labs ordered are listed, but only abnormal results are displayed) Labs Reviewed  COMPREHENSIVE METABOLIC PANEL - Abnormal; Notable for the following:       Result Value   Glucose, Bld 166 (*)    All other components within normal limits  URINALYSIS, COMPLETE (UACMP) WITH MICROSCOPIC - Abnormal; Notable for the following:    Color, Urine AMBER (*)    APPearance HAZY (*)    Ketones, ur 5 (*)    Protein, ur 30 (*)    Leukocytes, UA LARGE (*)    Squamous Epithelial / LPF 0-5 (*)    All other components within normal limits  INFLUENZA PANEL BY PCR (TYPE A & B) - Abnormal; Notable for the following:    Influenza A By PCR POSITIVE (*)    All other components within normal limits  URINE CULTURE  LIPASE, BLOOD  CBC   ____________________________________________   EKG    ____________________________________________     RADIOLOGY    ____________________________________________   PROCEDURES Procedures  ____________________________________________   INITIAL IMPRESSION / ASSESSMENT AND PLAN / ED COURSE  Pertinent labs & imaging results that were available during my care of the patient were reviewed by me and considered in my medical decision making (see chart for details).  Patient well appearing no acute distress, presents with constellation of symptoms consistent with influenza. She is positive for influenza A by PCR. Also has a clear urinary tract infection. Urine culture sent. After oral Zofran patient feels much better and is tolerating oral intake. We'll continue Zofran, start Levaquin as this is worked for her in the past for her UTIs according to the patient. Follow-up with primary care closely. No evidence of sepsis or meningitis encephalitis respiratory compromise or pyelonephritis.       ____________________________________________   FINAL CLINICAL IMPRESSION(S) / ED DIAGNOSES  Final diagnoses:  Influenza A  Cystitis      New Prescriptions   FAMOTIDINE (PEPCID) 20 MG TABLET    Take 1 tablet (20 mg total) by mouth 2 (two) times daily.   LEVOFLOXACIN (LEVAQUIN) 750 MG TABLET    Take 1 tablet (750 mg total) by mouth daily.   ONDANSETRON (ZOFRAN ODT) 4 MG DISINTEGRATING TABLET    Take 1 tablet (4 mg total) by mouth every 8 (eight) hours as needed for nausea or vomiting.     Portions of this note were generated with dragon dictation software. Dictation errors may occur despite best attempts at proofreading.    Carrie Mew, MD 06/05/16 [phone]

## 2016-06-07 LAB — URINE CULTURE

## 2016-07-24 ENCOUNTER — Other Ambulatory Visit: Payer: Self-pay | Admitting: Internal Medicine

## 2016-07-24 DIAGNOSIS — F17210 Nicotine dependence, cigarettes, uncomplicated: Secondary | ICD-10-CM | POA: Diagnosis not present

## 2016-07-24 DIAGNOSIS — I6523 Occlusion and stenosis of bilateral carotid arteries: Secondary | ICD-10-CM | POA: Diagnosis not present

## 2016-07-24 DIAGNOSIS — J441 Chronic obstructive pulmonary disease with (acute) exacerbation: Secondary | ICD-10-CM | POA: Diagnosis not present

## 2016-07-24 DIAGNOSIS — E782 Mixed hyperlipidemia: Secondary | ICD-10-CM | POA: Diagnosis not present

## 2016-07-24 DIAGNOSIS — E1165 Type 2 diabetes mellitus with hyperglycemia: Secondary | ICD-10-CM | POA: Diagnosis not present

## 2016-07-24 DIAGNOSIS — Z1231 Encounter for screening mammogram for malignant neoplasm of breast: Secondary | ICD-10-CM

## 2016-07-24 DIAGNOSIS — Z0001 Encounter for general adult medical examination with abnormal findings: Secondary | ICD-10-CM | POA: Diagnosis not present

## 2016-08-06 ENCOUNTER — Other Ambulatory Visit
Admission: RE | Admit: 2016-08-06 | Discharge: 2016-08-06 | Disposition: A | Discharge status: Home / Self Care | Payer: Medicare HMO | Source: Ambulatory Visit | Attending: Internal Medicine | Admitting: Internal Medicine

## 2016-08-06 DIAGNOSIS — E782 Mixed hyperlipidemia: Secondary | ICD-10-CM | POA: Insufficient documentation

## 2016-08-06 DIAGNOSIS — I10 Essential (primary) hypertension: Secondary | ICD-10-CM | POA: Diagnosis not present

## 2016-08-06 DIAGNOSIS — F17210 Nicotine dependence, cigarettes, uncomplicated: Secondary | ICD-10-CM | POA: Diagnosis not present

## 2016-08-06 DIAGNOSIS — E1122 Type 2 diabetes mellitus with diabetic chronic kidney disease: Secondary | ICD-10-CM | POA: Insufficient documentation

## 2016-08-06 DIAGNOSIS — Z0001 Encounter for general adult medical examination with abnormal findings: Secondary | ICD-10-CM | POA: Insufficient documentation

## 2016-08-06 LAB — CBC WITH DIFFERENTIAL/PLATELET
BASOS ABS: 0 10*3/uL (ref 0–0.1)
BASOS PCT: 0 %
Eosinophils Absolute: 0.1 10*3/uL (ref 0–0.7)
Eosinophils Relative: 2 %
HCT: 39.4 % (ref 35.0–47.0)
HEMOGLOBIN: 13.3 g/dL (ref 12.0–16.0)
LYMPHS PCT: 34 %
Lymphs Abs: 2.6 10*3/uL (ref 1.0–3.6)
MCH: 30.5 pg (ref 26.0–34.0)
MCHC: 33.9 g/dL (ref 32.0–36.0)
MCV: 90.2 fL (ref 80.0–100.0)
MONO ABS: 0.5 10*3/uL (ref 0.2–0.9)
Monocytes Relative: 7 %
NEUTROS ABS: 4.4 10*3/uL (ref 1.4–6.5)
NEUTROS PCT: 57 %
Platelets: 237 10*3/uL (ref 150–440)
RBC: 4.37 MIL/uL (ref 3.80–5.20)
RDW: 13.4 % (ref 11.5–14.5)
WBC: 7.6 10*3/uL (ref 3.6–11.0)

## 2016-08-06 LAB — COMPREHENSIVE METABOLIC PANEL
ALK PHOS: 66 U/L (ref 38–126)
ALT: 22 U/L (ref 14–54)
AST: 25 U/L (ref 15–41)
Albumin: 4.6 g/dL (ref 3.5–5.0)
Anion gap: 7 (ref 5–15)
BILIRUBIN TOTAL: 0.6 mg/dL (ref 0.3–1.2)
BUN: 19 mg/dL (ref 6–20)
CALCIUM: 9.9 mg/dL (ref 8.9–10.3)
CHLORIDE: 106 mmol/L (ref 101–111)
CO2: 26 mmol/L (ref 22–32)
CREATININE: 0.46 mg/dL (ref 0.44–1.00)
Glucose, Bld: 99 mg/dL (ref 65–99)
Potassium: 4.5 mmol/L (ref 3.5–5.1)
Sodium: 139 mmol/L (ref 135–145)
TOTAL PROTEIN: 7.9 g/dL (ref 6.5–8.1)

## 2016-08-06 LAB — LIPID PANEL
CHOLESTEROL: 112 mg/dL (ref 0–200)
HDL: 47 mg/dL (ref >40)
LDL Cholesterol: 51 mg/dL (ref 0–99)
TRIGLYCERIDES: 72 mg/dL (ref <150)
Total CHOL/HDL Ratio: 2.4 RATIO
VLDL: 14 mg/dL (ref 0–40)

## 2016-08-06 LAB — TSH: TSH: 1.102 u[IU]/mL (ref 0.350–4.500)

## 2016-08-06 LAB — T4, FREE: Free T4: 0.95 ng/dL (ref 0.61–1.12)

## 2016-08-07 LAB — MICROALBUMIN, URINE: MICROALB UR: 41 ug/mL — AB

## 2016-08-13 DIAGNOSIS — I6523 Occlusion and stenosis of bilateral carotid arteries: Secondary | ICD-10-CM | POA: Diagnosis not present

## 2016-08-21 DIAGNOSIS — J069 Acute upper respiratory infection, unspecified: Secondary | ICD-10-CM | POA: Diagnosis not present

## 2016-08-21 DIAGNOSIS — M545 Low back pain: Secondary | ICD-10-CM | POA: Diagnosis not present

## 2016-08-21 DIAGNOSIS — E1140 Type 2 diabetes mellitus with diabetic neuropathy, unspecified: Secondary | ICD-10-CM | POA: Diagnosis not present

## 2016-08-21 DIAGNOSIS — N390 Urinary tract infection, site not specified: Secondary | ICD-10-CM | POA: Diagnosis not present

## 2016-08-21 DIAGNOSIS — F17210 Nicotine dependence, cigarettes, uncomplicated: Secondary | ICD-10-CM | POA: Diagnosis not present

## 2016-08-21 DIAGNOSIS — I10 Essential (primary) hypertension: Secondary | ICD-10-CM | POA: Diagnosis not present

## 2016-08-26 ENCOUNTER — Ambulatory Visit
Admission: RE | Admit: 2016-08-26 | Discharge: 2016-08-26 | Disposition: A | Discharge status: Home / Self Care | Payer: Medicare HMO | Source: Ambulatory Visit | Attending: Internal Medicine | Admitting: Internal Medicine

## 2016-08-26 DIAGNOSIS — Z1231 Encounter for screening mammogram for malignant neoplasm of breast: Secondary | ICD-10-CM | POA: Diagnosis not present

## 2016-09-02 DIAGNOSIS — E1165 Type 2 diabetes mellitus with hyperglycemia: Secondary | ICD-10-CM | POA: Diagnosis not present

## 2016-09-02 DIAGNOSIS — N390 Urinary tract infection, site not specified: Secondary | ICD-10-CM | POA: Diagnosis not present

## 2016-09-02 DIAGNOSIS — I6523 Occlusion and stenosis of bilateral carotid arteries: Secondary | ICD-10-CM | POA: Diagnosis not present

## 2016-09-04 ENCOUNTER — Other Ambulatory Visit: Payer: Self-pay | Admitting: Internal Medicine

## 2016-09-04 DIAGNOSIS — I6523 Occlusion and stenosis of bilateral carotid arteries: Secondary | ICD-10-CM

## 2016-09-10 ENCOUNTER — Ambulatory Visit
Admission: RE | Admit: 2016-09-10 | Discharge: 2016-09-10 | Disposition: A | Discharge status: Home / Self Care | Payer: Medicare HMO | Source: Ambulatory Visit | Attending: Internal Medicine | Admitting: Internal Medicine

## 2016-09-10 DIAGNOSIS — I6523 Occlusion and stenosis of bilateral carotid arteries: Secondary | ICD-10-CM

## 2016-09-10 DIAGNOSIS — M4322 Fusion of spine, cervical region: Secondary | ICD-10-CM | POA: Insufficient documentation

## 2016-09-10 DIAGNOSIS — I6521 Occlusion and stenosis of right carotid artery: Secondary | ICD-10-CM | POA: Insufficient documentation

## 2016-09-10 DIAGNOSIS — I6529 Occlusion and stenosis of unspecified carotid artery: Secondary | ICD-10-CM | POA: Diagnosis not present

## 2016-09-10 MED ORDER — IOPAMIDOL (ISOVUE-370) INJECTION 76%
75.0000 mL | Freq: Once | INTRAVENOUS | Status: AC | PRN
  Administered 2016-09-10: 75 mL via INTRAVENOUS

## 2016-10-28 DIAGNOSIS — I6523 Occlusion and stenosis of bilateral carotid arteries: Secondary | ICD-10-CM | POA: Diagnosis not present

## 2016-10-28 DIAGNOSIS — E1165 Type 2 diabetes mellitus with hyperglycemia: Secondary | ICD-10-CM | POA: Diagnosis not present

## 2016-10-28 DIAGNOSIS — E782 Mixed hyperlipidemia: Secondary | ICD-10-CM | POA: Diagnosis not present

## 2016-10-28 DIAGNOSIS — N390 Urinary tract infection, site not specified: Secondary | ICD-10-CM | POA: Diagnosis not present

## 2016-12-14 ENCOUNTER — Emergency Department
Admission: EM | Admit: 2016-12-14 | Discharge: 2016-12-14 | Disposition: A | Discharge status: Home / Self Care | Payer: Medicare HMO | Attending: Emergency Medicine | Admitting: Emergency Medicine

## 2016-12-14 DIAGNOSIS — N390 Urinary tract infection, site not specified: Secondary | ICD-10-CM | POA: Insufficient documentation

## 2016-12-14 DIAGNOSIS — Z7984 Long term (current) use of oral hypoglycemic drugs: Secondary | ICD-10-CM | POA: Insufficient documentation

## 2016-12-14 DIAGNOSIS — J449 Chronic obstructive pulmonary disease, unspecified: Secondary | ICD-10-CM | POA: Insufficient documentation

## 2016-12-14 DIAGNOSIS — E119 Type 2 diabetes mellitus without complications: Secondary | ICD-10-CM | POA: Diagnosis not present

## 2016-12-14 DIAGNOSIS — Z79899 Other long term (current) drug therapy: Secondary | ICD-10-CM | POA: Diagnosis not present

## 2016-12-14 DIAGNOSIS — F17210 Nicotine dependence, cigarettes, uncomplicated: Secondary | ICD-10-CM | POA: Insufficient documentation

## 2016-12-14 DIAGNOSIS — Z7982 Long term (current) use of aspirin: Secondary | ICD-10-CM | POA: Insufficient documentation

## 2016-12-14 DIAGNOSIS — R300 Dysuria: Secondary | ICD-10-CM | POA: Diagnosis present

## 2016-12-14 LAB — COMPREHENSIVE METABOLIC PANEL
ALT: 15 U/L (ref 14–54)
AST: 19 U/L (ref 15–41)
Albumin: 4.9 g/dL (ref 3.5–5.0)
Alkaline Phosphatase: 77 U/L (ref 38–126)
Anion gap: 9 (ref 5–15)
BILIRUBIN TOTAL: 0.5 mg/dL (ref 0.3–1.2)
BUN: 26 mg/dL — AB (ref 6–20)
CO2: 24 mmol/L (ref 22–32)
Calcium: 9.8 mg/dL (ref 8.9–10.3)
Chloride: 105 mmol/L (ref 101–111)
Creatinine, Ser: 0.76 mg/dL (ref 0.44–1.00)
Glucose, Bld: 173 mg/dL — ABNORMAL HIGH (ref 65–99)
POTASSIUM: 4.4 mmol/L (ref 3.5–5.1)
Sodium: 138 mmol/L (ref 135–145)
TOTAL PROTEIN: 8.7 g/dL — AB (ref 6.5–8.1)

## 2016-12-14 LAB — CBC
HEMATOCRIT: 38.7 % (ref 35.0–47.0)
Hemoglobin: 12.9 g/dL (ref 12.0–16.0)
MCH: 30.9 pg (ref 26.0–34.0)
MCHC: 33.4 g/dL (ref 32.0–36.0)
MCV: 92.5 fL (ref 80.0–100.0)
PLATELETS: 217 10*3/uL (ref 150–440)
RBC: 4.19 MIL/uL (ref 3.80–5.20)
RDW: 12.8 % (ref 11.5–14.5)
WBC: 11.9 10*3/uL — AB (ref 3.6–11.0)

## 2016-12-14 LAB — URINALYSIS, COMPLETE (UACMP) WITH MICROSCOPIC
Bilirubin Urine: NEGATIVE
GLUCOSE, UA: NEGATIVE mg/dL
Ketones, ur: NEGATIVE mg/dL
NITRITE: POSITIVE — AB
Protein, ur: 30 mg/dL — AB
SPECIFIC GRAVITY, URINE: 1.019 (ref 1.005–1.030)
pH: 5 (ref 5.0–8.0)

## 2016-12-14 LAB — LACTIC ACID, PLASMA: Lactic Acid, Venous: 1.1 mmol/L (ref 0.5–1.9)

## 2016-12-14 LAB — LIPASE, BLOOD: Lipase: 23 U/L (ref 11–51)

## 2016-12-14 MED ORDER — ACETAMINOPHEN 500 MG PO TABS
1000.0000 mg | ORAL_TABLET | Freq: Once | ORAL | Status: AC
  Administered 2016-12-14: 1000 mg via ORAL
  Filled 2016-12-14: qty 2

## 2016-12-14 MED ORDER — ONDANSETRON 4 MG PO TBDP
ORAL_TABLET | ORAL | Status: AC
  Administered 2016-12-14: 4 mg via ORAL
  Filled 2016-12-14: qty 1

## 2016-12-14 MED ORDER — PHENAZOPYRIDINE HCL 200 MG PO TABS: 200.0000 mg | ORAL_TABLET | Freq: Three times a day (TID) | ORAL | 0 refills | Status: DC | PRN

## 2016-12-14 MED ORDER — ONDANSETRON HCL 4 MG PO TABS: 4.0000 mg | ORAL_TABLET | Freq: Three times a day (TID) | ORAL | 0 refills | Status: DC | PRN

## 2016-12-14 MED ORDER — SODIUM CHLORIDE 0.9 % IV BOLUS (SEPSIS)
1000.0000 mL | Freq: Once | INTRAVENOUS | Status: AC
  Administered 2016-12-14: 1000 mL via INTRAVENOUS

## 2016-12-14 MED ORDER — ONDANSETRON 4 MG PO TBDP
4.0000 mg | ORAL_TABLET | Freq: Once | ORAL | Status: AC
  Administered 2016-12-14: 4 mg via ORAL

## 2016-12-14 MED ORDER — CEPHALEXIN 500 MG PO CAPS: 500.0000 mg | ORAL_CAPSULE | Freq: Three times a day (TID) | ORAL | 0 refills | Status: DC

## 2016-12-14 MED ORDER — ONDANSETRON HCL 4 MG/2ML IJ SOLN
4.0000 mg | Freq: Once | INTRAMUSCULAR | Status: AC
  Administered 2016-12-14: 4 mg via INTRAVENOUS
  Filled 2016-12-14: qty 2

## 2016-12-14 MED ORDER — SODIUM CHLORIDE 0.9 % IV BOLUS (SEPSIS)
500.0000 mL | Freq: Once | INTRAVENOUS | Status: AC
  Administered 2016-12-14: 500 mL via INTRAVENOUS

## 2016-12-14 MED ORDER — DEXTROSE 5 % IV SOLN
1.0000 g | Freq: Once | INTRAVENOUS | Status: AC
  Administered 2016-12-14: 1 g via INTRAVENOUS
  Filled 2016-12-14: qty 10

## 2016-12-14 NOTE — ED Notes (Signed)
Reviewed d/c instructions, follow-up care, prescriptions with patient. Pt verbalized understanding.  

## 2016-12-14 NOTE — ED Notes (Signed)
Patient c/o nausea. MD informed. MD ordered ODT Zofran to be given in ED prior to d/c and gave patient prescription of zofran for home use.

## 2016-12-14 NOTE — ED Provider Notes (Signed)
[facility] Emergency Department Provider Note   ____________________________________________   I have reviewed the triage vital signs and the nursing notes.   HISTORY  Chief Complaint Dysuria; Chills; and Generalized Body Aches   History limited by: Not Limited   HPI Alexandra Yang is a 71 y.o. female who presents to the emergency department today with concerns for change in urination, chills and body aches. Patient states that she has been having chills and painful urination for the past 3 days. This has been accompanied by increased frequency of urination. During this time the patient has developed bilateral lower back pain as well as body cramps. She states she has a history of UTIs in the past. She denies any fevers.   Past Medical History:  Diagnosis Date  . Arthritis    osteoarthritis  . Cancer ([place]) 1995   endometrial ca  . COPD (chronic obstructive pulmonary disease) ([place])   . Diabetes mellitus without complication ([place])    type 2  . GERD (gastroesophageal reflux disease)   . History of kidney infection    had to have PICC line  . Neuropathy    both feet  . Osteopenia   . Osteoporosis   . Pneumonia     There are no active problems to display for this patient.   Past Surgical History:  Procedure Laterality Date  . ABDOMINAL HYSTERECTOMY    . CHOLECYSTECTOMY    . KNEE ARTHROSCOPY Right   . TENDON TRANSFER Right 01/20/2015   Procedure: RIGHT HAND AND MIDDLE FINGER TENDON RECONSTRUCTION AND TRANSFER;  Surgeon: Roseanne Kaufman, MD;  Location: [place];  Service: Orthopedics;  Laterality: Right;    Prior to Admission medications   Medication Sig Start Date End Date Taking? Authorizing Provider  aspirin 81 MG chewable tablet Chew 81 mg by mouth every evening.     [provider]  chlorpheniramine-HYDROcodone (TUSSIONEX PENNKINETIC ER) 10-8 MG/5ML SUER Take 5 mLs by mouth 2 (two) times daily. 05/21/15   Sable Feil, PA-C   cholecalciferol (VITAMIN D) 1000 UNITS tablet Take 1,000 Units by mouth daily.    [provider]  famotidine (PEPCID) 20 MG tablet Take 1 tablet (20 mg total) by mouth 2 (two) times daily. 06/05/16   Carrie Mew, MD  gabapentin (NEURONTIN) 300 MG capsule Take 300 mg by mouth at bedtime as needed (pain).    [provider]  HYDROcodone-acetaminophen (NORCO) 5-325 MG per tablet Take 2 tablets by mouth every 6 (six) hours as needed for moderate pain. 01/20/15   Roseanne Kaufman, MD  HYDROcodone-acetaminophen (NORCO/VICODIN) 5-325 MG per tablet Take 0.5 tablets by mouth every 4 (four) hours as needed. 12/18/14   [provider]  ketorolac (TORADOL) 10 MG tablet Take 1 tablet (10 mg total) by mouth every 6 (six) hours as needed. 05/21/15   Sable Feil, PA-C  levofloxacin (LEVAQUIN) 750 MG tablet Take 1 tablet (750 mg total) by mouth daily. 06/05/16   Carrie Mew, MD  lisinopril (PRINIVIL,ZESTRIL) 2.5 MG tablet Take 2.5 mg by mouth daily.    [provider]  meloxicam (MOBIC) 7.5 MG tablet Take 7.5 mg by mouth daily as needed for pain.    [provider]  metFORMIN (GLUMETZA) 1000 MG (MOD) 24 hr tablet Take 1,000 mg by mouth daily with breakfast.    [provider]  metoCLOPramide (REGLAN) 10 MG tablet Take 20 mg by mouth daily.    [provider]  Multiple Vitamins-Minerals (CENTRUM SILVER PO) Take  1 tablet by mouth daily.    [provider]  ondansetron (ZOFRAN ODT) 4 MG disintegrating tablet Take 1 tablet (4 mg total) by mouth every 8 (eight) hours as needed for nausea or vomiting. 06/05/16   Carrie Mew, MD  pregabalin (LYRICA) 50 MG capsule Take 50 mg by mouth at bedtime as needed (pain).    [provider]  simvastatin (ZOCOR) 40 MG tablet Take 40 mg by mouth at bedtime.    [provider]  sulfamethoxazole-trimethoprim (BACTRIM DS,SEPTRA DS) 800-160 MG tablet Take 1 tablet by mouth 2 (two) times  daily. 05/21/15   Sable Feil, PA-C  VICTOZA 18 MG/3ML SOPN Inject 1.8 Units into the skin every morning. 12/09/14   [provider]  vitamin B-12 (CYANOCOBALAMIN) 1000 MCG tablet Take 3,000 mcg by mouth daily.    [provider]    Allergies Percodan [oxycodone-aspirin]; Ciprofloxacin; Naproxen; and Ultram [tramadol]  Family History  Problem Relation Age of Onset  . Pancreatic cancer Mother   . Lung cancer Father   . Breast cancer Sister 81    Social History Social History  Substance Use Topics  . Smoking status: Current Every Day Smoker    Packs/day: 0.25    Types: Cigarettes  . Smokeless tobacco: Never Used  . Alcohol use Yes     Comment: occasional    Review of Systems Constitutional: No fever. Positive for chills.  Eyes: No visual changes. ENT: No sore throat. Cardiovascular: Denies chest pain. Respiratory: Denies shortness of breath. Gastrointestinal: No abdominal pain.  No nausea, no vomiting.  No diarrhea.   Genitourinary: Positive for dysuria.  Musculoskeletal: Positive for bilateral lower back pain. Skin: Negative for rash. Neurological: Negative for headaches, focal weakness or numbness.  ____________________________________________   PHYSICAL EXAM:  VITAL SIGNS: ED Triage Vitals  Enc Vitals Group     BP 12/14/16 1403 135/68     Pulse Rate 12/14/16 1403 (!) 112     Resp 12/14/16 1403 18     Temp 12/14/16 1403 98.9 F (37.2 C)     Temp Source 12/14/16 1403 Oral     SpO2 12/14/16 1403 96 %     Weight 12/14/16 1354 140 lb (63.5 kg)     Height 12/14/16 1354 5\' 7"  (1.702 m)     Head Circumference --      Peak Flow --      Pain Score 12/14/16 1354 8   Constitutional: Alert and oriented. Well appearing and in no distress. Eyes: Conjunctivae are normal.  ENT   Head: Normocephalic and atraumatic.   Nose: No congestion/rhinnorhea.   Mouth/Throat: Mucous membranes are moist.   Neck: No  stridor. Hematological/Lymphatic/Immunilogical: No cervical lymphadenopathy. Cardiovascular: Normal rate, regular rhythm.  No murmurs, rubs, or gallops.  Respiratory: Normal respiratory effort without tachypnea nor retractions. Breath sounds are clear and equal bilaterally. No wheezes/rales/rhonchi. Gastrointestinal: Soft and non tender. No rebound. No guarding.  Genitourinary: Deferred Musculoskeletal: Normal range of motion in all extremities. No lower extremity edema. Neurologic:  Normal speech and language. No gross focal neurologic deficits are appreciated.  Skin:  Skin is warm, dry and intact. No rash noted. Psychiatric: Mood and affect are normal. Speech and behavior are normal. Patient exhibits appropriate insight and judgment.  ____________________________________________    LABS (pertinent positives/negatives)  Labs Reviewed  COMPREHENSIVE METABOLIC PANEL - Abnormal; Notable for the following:       Result Value   Glucose, Bld 173 (*)    BUN 26 (*)  Total Protein 8.7 (*)    All other components within normal limits  CBC - Abnormal; Notable for the following:    WBC 11.9 (*)    All other components within normal limits  URINALYSIS, COMPLETE (UACMP) WITH MICROSCOPIC - Abnormal; Notable for the following:    Color, Urine YELLOW (*)    APPearance HAZY (*)    Hgb urine dipstick SMALL (*)    Protein, ur 30 (*)    Nitrite POSITIVE (*)    Leukocytes, UA MODERATE (*)    Bacteria, UA RARE (*)    Squamous Epithelial / LPF 0-5 (*)    All other components within normal limits  URINE CULTURE  LIPASE, BLOOD  LACTIC ACID, PLASMA     ____________________________________________   EKG  None  ____________________________________________    RADIOLOGY  None  ____________________________________________   PROCEDURES  Procedures  ____________________________________________   INITIAL IMPRESSION / ASSESSMENT AND PLAN / ED COURSE  Pertinent labs & imaging  results that were available during my care of the patient were reviewed by me and considered in my medical decision making (see chart for details).  Patient workup here consistent with urinary tract infection. Patient without concerning take acidosis. Patient with very mild leukocytosis. Patient was given IV fluids and antibiotics here in the emergency department.will discharge with further antibiotics.  ____________________________________________   FINAL CLINICAL IMPRESSION(S) / ED DIAGNOSES  Final diagnoses:  Lower urinary tract infectious disease     Note: This dictation was prepared with Dragon dictation. Any transcriptional errors that result from this process are unintentional     Nance Pear, MD 12/14/16 2021

## 2016-12-14 NOTE — ED Triage Notes (Signed)
Pt to ED via POV with c/o chills, generalized body aches, nausea, burning with urination, and bilateral lower back pain x2 days.

## 2016-12-14 NOTE — Discharge Instructions (Signed)
Please seek medical attention for any high fevers, chest pain, shortness of breath, change in behavior, persistent vomiting, bloody stool or any other new or concerning symptoms.  

## 2016-12-14 NOTE — ED Notes (Signed)
Removed patient's peripheral IV, left AC. Site clean, dry and intact

## 2016-12-18 NOTE — Progress Notes (Signed)
ED Antimicrobial Stewardship Positive Culture Follow Up   Alexandra Yang is an 71 y.o. female who presented to [facility] on 12/14/2016 with a chief complaint of  Chief Complaint  Patient presents with  . Dysuria  . Chills  . Generalized Body Aches    Recent Results (from the past 720 hour(s))  Urine Culture     Status: Abnormal   Collection Time: 12/14/16  1:55 PM  Result Value Ref Range Status   Specimen Description URINE, RANDOM  Final   Special Requests NONE  Final   Culture (A)  Final    >=100,000 COLONIES/mL ESCHERICHIA COLI Confirmed Extended Spectrum Beta-Lactamase Producer (ESBL) Performed at [facility], [address], [place],  [address]    Report Status 12/17/2016 FINAL  Final   Organism ID, Bacteria ESCHERICHIA COLI (A)  Final      Susceptibility   Escherichia coli - MIC*    AMPICILLIN >=32 RESISTANT Resistant     CEFAZOLIN >=64 RESISTANT Resistant     CEFTRIAXONE >=64 RESISTANT Resistant     CIPROFLOXACIN >=4 RESISTANT Resistant     GENTAMICIN >=16 RESISTANT Resistant     IMIPENEM <=0.25 SENSITIVE Sensitive     NITROFURANTOIN <=16 SENSITIVE Sensitive     TRIMETH/SULFA >=320 RESISTANT Resistant     AMPICILLIN/SULBACTAM >=32 RESISTANT Resistant     PIP/TAZO 8 SENSITIVE Sensitive     Extended ESBL POSITIVE Resistant     * >=100,000 COLONIES/mL ESCHERICHIA COLI    [x]  Treated with cephalexin, organism resistant to prescribed antimicrobial   Called patient at [phone] and explained that ER MD would like patient to return to ER for possible IV antibiotic for ESBL E.coli UTI since patient states she is not really better. Per patient she has an appt with Dr. Clayborn Bigness tomorrow 12/19/16.  Called Dr. Laurelyn Sickle office # [phone] and left message concerning f/u of Urine Cx.  Patient stated she had an ESBL UTI ~ 4 yrs ago and had a PICC line then.  Explained to patient she may require IV antibiotics and she should f/u in the [facility] ER and to let Dr.  Humphrey Rolls know about culture results.   ED Provider: Lorenda Ishihara A PharmD 12/18/2016, 4:48 PM

## 2016-12-19 ENCOUNTER — Inpatient Hospital Stay
Admission: EM | Admit: 2016-12-19 | Discharge: 2016-12-21 | DRG: 872 | Disposition: A | Discharge status: Home Health | Payer: Medicare HMO | Attending: Internal Medicine | Admitting: Internal Medicine

## 2016-12-19 ENCOUNTER — Encounter: Payer: Self-pay | Admitting: Intensive Care

## 2016-12-19 DIAGNOSIS — M1990 Unspecified osteoarthritis, unspecified site: Secondary | ICD-10-CM | POA: Diagnosis present

## 2016-12-19 DIAGNOSIS — I10 Essential (primary) hypertension: Secondary | ICD-10-CM | POA: Diagnosis present

## 2016-12-19 DIAGNOSIS — E1140 Type 2 diabetes mellitus with diabetic neuropathy, unspecified: Secondary | ICD-10-CM | POA: Diagnosis present

## 2016-12-19 DIAGNOSIS — K219 Gastro-esophageal reflux disease without esophagitis: Secondary | ICD-10-CM | POA: Diagnosis present

## 2016-12-19 DIAGNOSIS — J449 Chronic obstructive pulmonary disease, unspecified: Secondary | ICD-10-CM | POA: Diagnosis not present

## 2016-12-19 DIAGNOSIS — Z794 Long term (current) use of insulin: Secondary | ICD-10-CM

## 2016-12-19 DIAGNOSIS — N3289 Other specified disorders of bladder: Secondary | ICD-10-CM | POA: Diagnosis present

## 2016-12-19 DIAGNOSIS — Z8542 Personal history of malignant neoplasm of other parts of uterus: Secondary | ICD-10-CM | POA: Diagnosis not present

## 2016-12-19 DIAGNOSIS — Z79899 Other long term (current) drug therapy: Secondary | ICD-10-CM | POA: Diagnosis not present

## 2016-12-19 DIAGNOSIS — Z1612 Extended spectrum beta lactamase (ESBL) resistance: Secondary | ICD-10-CM | POA: Diagnosis present

## 2016-12-19 DIAGNOSIS — E785 Hyperlipidemia, unspecified: Secondary | ICD-10-CM | POA: Diagnosis present

## 2016-12-19 DIAGNOSIS — A419 Sepsis, unspecified organism: Principal | ICD-10-CM | POA: Diagnosis present

## 2016-12-19 DIAGNOSIS — N390 Urinary tract infection, site not specified: Secondary | ICD-10-CM | POA: Diagnosis not present

## 2016-12-19 DIAGNOSIS — B9620 Unspecified Escherichia coli [E. coli] as the cause of diseases classified elsewhere: Secondary | ICD-10-CM | POA: Diagnosis not present

## 2016-12-19 DIAGNOSIS — E119 Type 2 diabetes mellitus without complications: Secondary | ICD-10-CM | POA: Diagnosis not present

## 2016-12-19 DIAGNOSIS — R6883 Chills (without fever): Secondary | ICD-10-CM | POA: Diagnosis not present

## 2016-12-19 DIAGNOSIS — M545 Low back pain: Secondary | ICD-10-CM | POA: Diagnosis not present

## 2016-12-19 DIAGNOSIS — M810 Age-related osteoporosis without current pathological fracture: Secondary | ICD-10-CM | POA: Diagnosis present

## 2016-12-19 DIAGNOSIS — G8929 Other chronic pain: Secondary | ICD-10-CM | POA: Diagnosis present

## 2016-12-19 DIAGNOSIS — N200 Calculus of kidney: Secondary | ICD-10-CM | POA: Diagnosis present

## 2016-12-19 DIAGNOSIS — D72829 Elevated white blood cell count, unspecified: Secondary | ICD-10-CM | POA: Diagnosis not present

## 2016-12-19 DIAGNOSIS — F17210 Nicotine dependence, cigarettes, uncomplicated: Secondary | ICD-10-CM | POA: Diagnosis present

## 2016-12-19 DIAGNOSIS — Z7982 Long term (current) use of aspirin: Secondary | ICD-10-CM | POA: Diagnosis not present

## 2016-12-19 DIAGNOSIS — N12 Tubulo-interstitial nephritis, not specified as acute or chronic: Secondary | ICD-10-CM

## 2016-12-19 LAB — CBC WITH DIFFERENTIAL/PLATELET
BASOS PCT: 0 %
Basophils Absolute: 0 10*3/uL (ref 0–0.1)
EOS ABS: 0 10*3/uL (ref 0–0.7)
Eosinophils Relative: 0 %
HCT: 36.9 % (ref 35.0–47.0)
HEMOGLOBIN: 12.3 g/dL (ref 12.0–16.0)
Lymphocytes Relative: 7 %
Lymphs Abs: 1.1 10*3/uL (ref 1.0–3.6)
MCH: 30.7 pg (ref 26.0–34.0)
MCHC: 33.3 g/dL (ref 32.0–36.0)
MCV: 92.4 fL (ref 80.0–100.0)
MONOS PCT: 3 %
Monocytes Absolute: 0.4 10*3/uL (ref 0.2–0.9)
NEUTROS PCT: 90 %
Neutro Abs: 13.7 10*3/uL — ABNORMAL HIGH (ref 1.4–6.5)
Platelets: 236 10*3/uL (ref 150–440)
RBC: 4 MIL/uL (ref 3.80–5.20)
RDW: 12.7 % (ref 11.5–14.5)
WBC: 15.3 10*3/uL — AB (ref 3.6–11.0)

## 2016-12-19 LAB — COMPREHENSIVE METABOLIC PANEL
ALBUMIN: 3.8 g/dL (ref 3.5–5.0)
ALT: 17 U/L (ref 14–54)
ANION GAP: 9 (ref 5–15)
AST: 21 U/L (ref 15–41)
Alkaline Phosphatase: 63 U/L (ref 38–126)
BILIRUBIN TOTAL: 0.5 mg/dL (ref 0.3–1.2)
BUN: 15 mg/dL (ref 6–20)
CHLORIDE: 105 mmol/L (ref 101–111)
CO2: 26 mmol/L (ref 22–32)
Calcium: 9.9 mg/dL (ref 8.9–10.3)
Creatinine, Ser: 0.76 mg/dL (ref 0.44–1.00)
GFR calc Af Amer: >60 mL/min (ref >60)
GFR calc non Af Amer: >60 mL/min (ref >60)
Glucose, Bld: 169 mg/dL — ABNORMAL HIGH (ref 65–99)
POTASSIUM: 3.9 mmol/L (ref 3.5–5.1)
SODIUM: 140 mmol/L (ref 135–145)
TOTAL PROTEIN: 7.8 g/dL (ref 6.5–8.1)

## 2016-12-19 LAB — GLUCOSE, CAPILLARY: GLUCOSE-CAPILLARY: 127 mg/dL — AB (ref 65–99)

## 2016-12-19 LAB — LACTIC ACID, PLASMA: LACTIC ACID, VENOUS: 1 mmol/L (ref 0.5–1.9)

## 2016-12-19 MED ORDER — INSULIN GLARGINE 100 UNIT/ML ~~LOC~~ SOLN
12.0000 [IU] | Freq: Every evening | SUBCUTANEOUS | Status: DC
  Filled 2016-12-19 (×2): qty 0.16

## 2016-12-19 MED ORDER — SODIUM CHLORIDE 0.9 % IV SOLN
1.0000 g | Freq: Three times a day (TID) | INTRAVENOUS | Status: DC
  Administered 2016-12-19 – 2016-12-21 (×5): 1 g via INTRAVENOUS
  Filled 2016-12-19 (×7): qty 1

## 2016-12-19 MED ORDER — INSULIN ASPART 100 UNIT/ML ~~LOC~~ SOLN
0.0000 [IU] | Freq: Three times a day (TID) | SUBCUTANEOUS | Status: DC
  Administered 2016-12-20: 1 [IU] via SUBCUTANEOUS
  Filled 2016-12-19: qty 1

## 2016-12-19 MED ORDER — VITAMIN D 1000 UNITS PO TABS
1000.0000 [IU] | ORAL_TABLET | Freq: Every day | ORAL | Status: DC
  Administered 2016-12-20 – 2016-12-21 (×2): 1000 [IU] via ORAL
  Filled 2016-12-19 (×2): qty 1

## 2016-12-19 MED ORDER — ALBUTEROL SULFATE (2.5 MG/3ML) 0.083% IN NEBU: 2.5000 mg | INHALATION_SOLUTION | Freq: Four times a day (QID) | RESPIRATORY_TRACT | Status: DC | PRN

## 2016-12-19 MED ORDER — ASPIRIN 81 MG PO CHEW
81.0000 mg | CHEWABLE_TABLET | Freq: Every evening | ORAL | Status: DC
  Administered 2016-12-19 – 2016-12-20 (×2): 81 mg via ORAL
  Filled 2016-12-19 (×2): qty 1

## 2016-12-19 MED ORDER — PREGABALIN 75 MG PO CAPS: 75.0000 mg | ORAL_CAPSULE | Freq: Every evening | ORAL | Status: DC | PRN

## 2016-12-19 MED ORDER — MELOXICAM 7.5 MG PO TABS
7.5000 mg | ORAL_TABLET | Freq: Every day | ORAL | Status: DC | PRN
  Filled 2016-12-19: qty 1

## 2016-12-19 MED ORDER — METFORMIN HCL ER 500 MG PO TB24
500.0000 mg | ORAL_TABLET | Freq: Every day | ORAL | Status: DC
  Administered 2016-12-20: 500 mg via ORAL
  Filled 2016-12-19 (×2): qty 1

## 2016-12-19 MED ORDER — LISINOPRIL 5 MG PO TABS
5.0000 mg | ORAL_TABLET | Freq: Every day | ORAL | Status: DC
  Administered 2016-12-19 – 2016-12-21 (×2): 5 mg via ORAL
  Filled 2016-12-19 (×2): qty 1

## 2016-12-19 MED ORDER — ACETAMINOPHEN 325 MG PO TABS: 650.0000 mg | ORAL_TABLET | Freq: Four times a day (QID) | ORAL | Status: DC | PRN

## 2016-12-19 MED ORDER — ONDANSETRON HCL 4 MG PO TABS: 4.0000 mg | ORAL_TABLET | Freq: Four times a day (QID) | ORAL | Status: DC | PRN

## 2016-12-19 MED ORDER — PHENAZOPYRIDINE HCL 100 MG PO TABS
100.0000 mg | ORAL_TABLET | Freq: Three times a day (TID) | ORAL | Status: DC
  Filled 2016-12-19 (×6): qty 1

## 2016-12-19 MED ORDER — SODIUM CHLORIDE 0.9 % IV BOLUS (SEPSIS)
1000.0000 mL | Freq: Once | INTRAVENOUS | Status: AC
  Administered 2016-12-19: 1000 mL via INTRAVENOUS

## 2016-12-19 MED ORDER — ACETAMINOPHEN 650 MG RE SUPP: 650.0000 mg | Freq: Four times a day (QID) | RECTAL | Status: DC | PRN

## 2016-12-19 MED ORDER — ONDANSETRON HCL 4 MG/2ML IJ SOLN
4.0000 mg | Freq: Four times a day (QID) | INTRAMUSCULAR | Status: DC | PRN
  Administered 2016-12-19 – 2016-12-20 (×2): 4 mg via INTRAVENOUS
  Filled 2016-12-19 (×2): qty 2

## 2016-12-19 MED ORDER — INSULIN ASPART 100 UNIT/ML ~~LOC~~ SOLN: 0.0000 [IU] | Freq: Every day | SUBCUTANEOUS | Status: DC

## 2016-12-19 MED ORDER — VITAMIN B-12 1000 MCG PO TABS
1000.0000 ug | ORAL_TABLET | Freq: Every day | ORAL | Status: DC
  Administered 2016-12-20 – 2016-12-21 (×2): 1000 ug via ORAL
  Filled 2016-12-19 (×2): qty 1

## 2016-12-19 MED ORDER — SODIUM CHLORIDE 0.9 % IV SOLN
INTRAVENOUS | Status: DC
  Administered 2016-12-19: 23:00:00 via INTRAVENOUS

## 2016-12-19 MED ORDER — LIRAGLUTIDE 18 MG/3ML ~~LOC~~ SOPN: 1.8000 mg | PEN_INJECTOR | Freq: Every morning | SUBCUTANEOUS | Status: DC

## 2016-12-19 MED ORDER — ENOXAPARIN SODIUM 40 MG/0.4ML ~~LOC~~ SOLN
40.0000 mg | SUBCUTANEOUS | Status: DC
  Administered 2016-12-19 – 2016-12-20 (×2): 40 mg via SUBCUTANEOUS
  Filled 2016-12-19 (×2): qty 0.4

## 2016-12-19 MED ORDER — SIMVASTATIN 20 MG PO TABS
40.0000 mg | ORAL_TABLET | Freq: Every day | ORAL | Status: DC
  Administered 2016-12-19 – 2016-12-20 (×2): 40 mg via ORAL
  Filled 2016-12-19 (×2): qty 2

## 2016-12-19 NOTE — ED Notes (Signed)
AAOx3.  Skin warm and dry.  NAD 

## 2016-12-19 NOTE — Progress Notes (Signed)
Pharmacy Antibiotic Note  Alexandra Yang is a 71 y.o. female admitted on 12/19/2016 with ESBL UTI.  Pharmacy has been consulted for meropenem dosing.  Plan: Meropenem 1 gm IV Q8H   Height: 5\' 7"  (170.2 cm) Weight: 140 lb (63.5 kg) IBW/kg (Calculated) : 61.6  Temp (24hrs), Avg:98.3 F (36.8 C), Min:98.3 F (36.8 C), Max:98.3 F (36.8 C)   Recent Labs Lab 12/14/16 1403 12/14/16 1635 12/19/16 1647  WBC 11.9*  --  15.3*  CREATININE 0.76  --  0.76  LATICACIDVEN  --  1.1  --     Estimated Creatinine Clearance: 64.5 mL/min (by C-G formula based on SCr of 0.76 mg/dL).    Allergies  Allergen Reactions  . Percodan [Oxycodone-Aspirin] Other (See Comments)    hallucinations  . Ciprofloxacin Rash and Other (See Comments)    Feels like body is on fire  . Naproxen Rash  . Ultram [Tramadol] Rash     Thank you for allowing pharmacy to be a part of this patient's care.  Laural Benes, Pharm.D., BCPS Clinical Pharmacist 12/19/2016 7:24 PM

## 2016-12-19 NOTE — ED Triage Notes (Signed)
Patient reports receiving a call from [facility] yesterday reporting she has ESBL and needed to come back to ER for a pick line. Was seen at [facility] on Sunday. A&O x4. Reports fever 101.7 this AM. No fever upon arrival. Did not take any medicine this AM for fever

## 2016-12-19 NOTE — ED Notes (Signed)
Patient transported to room 141. With [facility] EDT

## 2016-12-19 NOTE — ED Provider Notes (Signed)
[facility] Emergency Department Provider Note  ____________________________________________   First MD Initiated Contact with Patient 12/19/16 1835     (approximate)  I have reviewed the triage vital signs and the nursing notes.   HISTORY  Chief Complaint Urinary Tract Infection   HPI Alexandra Yang is a 71 y.o. female with a history of ESBL UTIs was presenting to the emergency department today with fever as well his request for IV antibiotics after being called yesterday by this emergency department for the culture growing her ESBL. The patient was treated with Keflex on July 29 and she says that she took the medicine for 2-3 days. However, she has since had fever at home which she says has been intermittent. She says that several years ago she had refractory UTIs that required a PICC line and IV antibiotics through the picc.  The patient says that her urine smells "bad" and that she is also having pain to her lower back. Denies any burning with urination or abdominal pain.   Past Medical History:  Diagnosis Date  . Arthritis    osteoarthritis  . Cancer ([place]) 1995   endometrial ca  . COPD (chronic obstructive pulmonary disease) ([place])   . Diabetes mellitus without complication ([place])    type 2  . GERD (gastroesophageal reflux disease)   . History of kidney infection    had to have PICC line  . Neuropathy    both feet  . Osteopenia   . Osteoporosis   . Pneumonia     There are no active problems to display for this patient.   Past Surgical History:  Procedure Laterality Date  . ABDOMINAL HYSTERECTOMY    . CHOLECYSTECTOMY    . KNEE ARTHROSCOPY Right   . TENDON TRANSFER Right 01/20/2015   Procedure: RIGHT HAND AND MIDDLE FINGER TENDON RECONSTRUCTION AND TRANSFER;  Surgeon: Roseanne Kaufman, MD;  Location: [place];  Service: Orthopedics;  Laterality: Right;    Prior to Admission medications   Medication Sig Start Date End Date Taking? Authorizing  Provider  aspirin 81 MG chewable tablet Chew 81 mg by mouth every evening.     [provider]  cephALEXin (KEFLEX) 500 MG capsule Take 1 capsule (500 mg total) by mouth 3 (three) times daily. 12/14/16   Nance Pear, MD  chlorpheniramine-HYDROcodone [facility] PENNKINETIC ER) 10-8 MG/5ML SUER Take 5 mLs by mouth 2 (two) times daily. 05/21/15   Sable Feil, PA-C  cholecalciferol (VITAMIN D) 1000 UNITS tablet Take 1,000 Units by mouth daily.    [provider]  famotidine (PEPCID) 20 MG tablet Take 1 tablet (20 mg total) by mouth 2 (two) times daily. 06/05/16   Carrie Mew, MD  gabapentin (NEURONTIN) 300 MG capsule Take 300 mg by mouth at bedtime as needed (pain).    [provider]  HYDROcodone-acetaminophen (NORCO) 5-325 MG per tablet Take 2 tablets by mouth every 6 (six) hours as needed for moderate pain. 01/20/15   Roseanne Kaufman, MD  HYDROcodone-acetaminophen (NORCO/VICODIN) 5-325 MG per tablet Take 0.5 tablets by mouth every 4 (four) hours as needed. 12/18/14   [provider]  ketorolac (TORADOL) 10 MG tablet Take 1 tablet (10 mg total) by mouth every 6 (six) hours as needed. 05/21/15   Sable Feil, PA-C  levofloxacin (LEVAQUIN) 750 MG tablet Take 1 tablet (750 mg total) by mouth daily. 06/05/16   Carrie Mew, MD  lisinopril (PRINIVIL,ZESTRIL) 2.5 MG tablet Take 2.5 mg by mouth daily.  [provider]  meloxicam (MOBIC) 7.5 MG tablet Take 7.5 mg by mouth daily as needed for pain.    [provider]  metFORMIN (GLUMETZA) 1000 MG (MOD) 24 hr tablet Take 1,000 mg by mouth daily with breakfast.    [provider]  metoCLOPramide (REGLAN) 10 MG tablet Take 20 mg by mouth daily.    [provider]  Multiple Vitamins-Minerals (CENTRUM SILVER PO) Take 1 tablet by mouth daily.    [provider]  ondansetron (ZOFRAN ODT) 4 MG disintegrating tablet Take 1 tablet (4 mg total) by mouth every 8 (eight) hours as  needed for nausea or vomiting. 06/05/16   Carrie Mew, MD  ondansetron (ZOFRAN) 4 MG tablet Take 1 tablet (4 mg total) by mouth every 8 (eight) hours as needed for nausea or vomiting. 12/14/16   Nance Pear, MD  phenazopyridine (PYRIDIUM) 200 MG tablet Take 1 tablet (200 mg total) by mouth 3 (three) times daily as needed for pain. 12/14/16 12/14/17  Nance Pear, MD  pregabalin (LYRICA) 50 MG capsule Take 50 mg by mouth at bedtime as needed (pain).    [provider]  simvastatin (ZOCOR) 40 MG tablet Take 40 mg by mouth at bedtime.    [provider]  sulfamethoxazole-trimethoprim (BACTRIM DS,SEPTRA DS) 800-160 MG tablet Take 1 tablet by mouth 2 (two) times daily. 05/21/15   Sable Feil, PA-C  VICTOZA 18 MG/3ML SOPN Inject 1.8 Units into the skin every morning. 12/09/14   [provider]  vitamin B-12 (CYANOCOBALAMIN) 1000 MCG tablet Take 3,000 mcg by mouth daily.    [provider]    Allergies Percodan [oxycodone-aspirin]; Ciprofloxacin; Naproxen; and Ultram [tramadol]  Family History  Problem Relation Age of Onset  . Pancreatic cancer Mother   . Lung cancer Father   . Breast cancer Sister 57    Social History Social History  Substance Use Topics  . Smoking status: Current Every Day Smoker    Packs/day: 0.25    Types: Cigarettes  . Smokeless tobacco: Never Used  . Alcohol use Yes     Comment: occasional    Review of Systems  Constitutional: No fever/chills Eyes: No visual changes. ENT: No sore throat. Cardiovascular: Denies chest pain. Respiratory: Denies shortness of breath. Gastrointestinal: No abdominal pain.  No nausea, no vomiting.  No diarrhea.  No constipation. Genitourinary: As above Musculoskeletal: As above Skin: Negative for rash. Neurological: Negative for headaches, focal weakness or numbness.   ____________________________________________   PHYSICAL EXAM:  VITAL SIGNS: ED Triage Vitals  Enc Vitals  Group     BP 12/19/16 1643 (!) 124/53     Pulse Rate 12/19/16 1643 (!) 111     Resp 12/19/16 1643 18     Temp 12/19/16 1643 98.3 F (36.8 C)     Temp Source 12/19/16 1643 Oral     SpO2 12/19/16 1643 97 %     Weight 12/19/16 1644 140 lb (63.5 kg)     Height 12/19/16 1644 5\' 7"  (1.702 m)     Head Circumference --      Peak Flow --      Pain Score 12/19/16 1642 8     Pain Loc --      Pain Edu? --      Excl. in [place]? --     Constitutional: Alert and oriented. Well appearing and in no acute distress. Eyes: Conjunctivae are normal.  Head: Atraumatic. Nose: No congestion/rhinnorhea. Mouth/Throat: Mucous membranes are moist.  Neck: No  stridor.   Cardiovascular: Tachycardic, regular rhythm. Grossly normal heart sounds.   Respiratory: Normal respiratory effort.  No retractions. Lungs CTAB. Gastrointestinal: Soft and nontender. No distention. Bilateral low CVA tenderness to palpation. Musculoskeletal: No lower extremity tenderness nor edema.  No joint effusions. Neurologic:  Normal speech and language. No gross focal neurologic deficits are appreciated. Skin:  Skin is warm, dry and intact. No rash noted. Psychiatric: Mood and affect are normal. Speech and behavior are normal.  ____________________________________________   LABS (all labs ordered are listed, but only abnormal results are displayed)  Labs Reviewed  CBC WITH DIFFERENTIAL/PLATELET - Abnormal; Notable for the following:       Result Value   WBC 15.3 (*)    Neutro Abs 13.7 (*)    All other components within normal limits  COMPREHENSIVE METABOLIC PANEL - Abnormal; Notable for the following:    Glucose, Bld 169 (*)    All other components within normal limits  BASIC METABOLIC PANEL   ____________________________________________  EKG   ____________________________________________  RADIOLOGY   ____________________________________________   PROCEDURES  Procedure(s) performed:   Procedures  Critical Care  performed:   ____________________________________________   INITIAL IMPRESSION / ASSESSMENT AND PLAN / ED COURSE  Pertinent labs & imaging results that were available during my care of the patient were reviewed by me and considered in my medical decision making (see chart for details).  ----------------------------------------- 7:18 PM on 12/19/2016 -----------------------------------------  Patient meets sepsis criteria. We will admit her to the hospital for IV antibiotics. Sepsis lower called. Signed out to Dr. Terrace Arabia. Patient as well as family understanding of the plan and willing to comply.      ____________________________________________   FINAL CLINICAL IMPRESSION(S) / ED DIAGNOSES  Sepsis. Pyelonephritis.    NEW MEDICATIONS STARTED DURING THIS VISIT:  New Prescriptions   No medications on file     Note:  This document was prepared using Dragon voice recognition software and may include unintentional dictation errors.     Orbie Pyo, MD 12/19/16 [phone]

## 2016-12-19 NOTE — H&P (Signed)
[place] [facility] [place] NAME: Alexandra Yang    MR#:  [id number]  DATE OF BIRTH:  [birth year]  DATE OF ADMISSION:  12/19/2016  PRIMARY CARE PHYSICIAN: Lavera Guise, MD   REQUESTING/REFERRING PHYSICIAN: Dr. Larae Yang  CHIEF COMPLAINT:   Chief Complaint  Patient presents with  . Urinary Tract Infection    HISTORY OF PRESENT ILLNESS:  Alexandra Yang  is a 71 y.o. female with a known history of Osteoarthritis, history of endometrial cancer, COPD with ongoing tobacco abuse, diabetes, diabetic neuropathy, hypertension who presents to the hospital due to fevers, chills, dysuria, frequency. Patient was recently in the ER a few days back and diagnosed with a urinary tract infection and discharged on oral Keflex. Her urine cultures came back positive for ESBL. It was only sensitive to mostly IV antibiotics to cover she was asked to come back to the ER. She does say that she has not been feeling well since she was started on the Keflex. She continues to complain of foul smelling urine, dysuria, frequency and also lower back pain. She also says she had a fever of 101.7 at home earlier today. She denies any chest pains, shortness of breath, abdominal pain, diarrhea or any other associated symptoms presently. Hospitalist services were contacted further treatment and evaluation.  PAST MEDICAL HISTORY:   Past Medical History:  Diagnosis Date  . Arthritis    osteoarthritis  . Cancer ([place]) 1995   endometrial ca  . COPD (chronic obstructive pulmonary disease) ([place])   . Diabetes mellitus without complication ([place])    type 2  . GERD (gastroesophageal reflux disease)   . History of kidney infection    had to have PICC line  . Neuropathy    both feet  . Osteopenia   . Osteoporosis   . Pneumonia     PAST SURGICAL HISTORY:   Past Surgical History:  Procedure Laterality Date  . ABDOMINAL HYSTERECTOMY    . CHOLECYSTECTOMY    . KNEE ARTHROSCOPY Right   .  TENDON TRANSFER Right 01/20/2015   Procedure: RIGHT HAND AND MIDDLE FINGER TENDON RECONSTRUCTION AND TRANSFER;  Surgeon: Roseanne Kaufman, MD;  Location: [facility];  Service: Orthopedics;  Laterality: Right;    SOCIAL HISTORY:   Social History  Substance Use Topics  . Smoking status: Current Every Day Smoker    Packs/day: 0.50    Years: 40.00    Types: Cigarettes  . Smokeless tobacco: Never Used  . Alcohol use Yes     Comment: occasional    FAMILY HISTORY:   Family History  Problem Relation Age of Onset  . Pancreatic cancer Mother   . Lung cancer Father   . Breast cancer Sister 62    DRUG ALLERGIES:   Allergies  Allergen Reactions  . Percodan [Oxycodone-Aspirin] Other (See Comments)    hallucinations  . Ciprofloxacin Rash and Other (See Comments)    Feels like body is on fire  . Naproxen Rash  . Ultram [Tramadol] Rash    REVIEW OF SYSTEMS:   Review of Systems  Constitutional: Negative for fever and weight loss.  HENT: Negative for congestion, nosebleeds and tinnitus.   Eyes: Negative for blurred vision, double vision and redness.  Respiratory: Negative for cough, hemoptysis and shortness of breath.   Cardiovascular: Negative for chest pain, orthopnea, leg swelling and PND.  Gastrointestinal: Negative for abdominal pain, diarrhea, melena, nausea and vomiting.  Genitourinary: Positive for dysuria, frequency and urgency. Negative for hematuria.  Musculoskeletal: Positive for back pain (lower). Negative for falls and joint pain.  Neurological: Negative for dizziness, tingling, sensory change, focal weakness, seizures, weakness and headaches.  Endo/Heme/Allergies: Negative for polydipsia. Does not bruise/bleed easily.  Psychiatric/Behavioral: Negative for depression and memory loss. The patient is not nervous/anxious.     MEDICATIONS AT HOME:   Prior to Admission medications   Medication Sig Start Date End Date Taking? Authorizing Provider  albuterol (PROVENTIL) (2.5  MG/3ML) 0.083% nebulizer solution Take 2.5 mg by nebulization every 6 (six) hours as needed for wheezing or shortness of breath.   Yes [provider]  aspirin 81 MG chewable tablet Chew 81 mg by mouth every evening.    Yes [provider]  cholecalciferol (VITAMIN D) 1000 UNITS tablet Take 1,000 Units by mouth daily.   Yes [provider]  ibuprofen (ADVIL,MOTRIN) 800 MG tablet Take 800 mg by mouth 3 (three) times daily as needed. 12/01/16  Yes [provider]  insulin glargine (LANTUS) 100 UNIT/ML injection Inject 12-16 Units into the skin every evening.   Yes [provider]  lisinopril (PRINIVIL,ZESTRIL) 5 MG tablet Take 5 mg by mouth daily.    Yes [provider]  meloxicam (MOBIC) 7.5 MG tablet Take 7.5 mg by mouth daily as needed for pain.   Yes [provider]  metFORMIN (GLUCOPHAGE-XR) 500 MG 24 hr tablet Take 500 mg by mouth daily. 12/01/16  Yes [provider]  metoCLOPramide (REGLAN) 10 MG tablet Take 10 mg by mouth daily as needed.    Yes [provider]  Multiple Vitamins-Minerals (CENTRUM SILVER PO) Take 1 tablet by mouth daily.   Yes [provider]  ondansetron (ZOFRAN) 4 MG tablet Take 1 tablet (4 mg total) by mouth every 8 (eight) hours as needed for nausea or vomiting. 12/14/16  Yes Alexandra Pear, MD  pregabalin (LYRICA) 75 MG capsule Take 75 mg by mouth at bedtime as needed (pain).    Yes [provider]  simvastatin (ZOCOR) 40 MG tablet Take 40 mg by mouth at bedtime.   Yes [provider]  VICTOZA 18 MG/3ML SOPN Inject 1.8 Units into the skin every morning. 12/09/14  Yes [provider]  vitamin B-12 (CYANOCOBALAMIN) 1000 MCG tablet Take 1,000 mcg by mouth daily.    Yes [provider]  cephALEXin (KEFLEX) 500 MG capsule Take 1 capsule (500 mg total) by mouth 3 (three) times daily. Patient not taking: Reported on 12/19/2016 12/14/16   Alexandra Pear, MD   chlorpheniramine-HYDROcodone [facility] PENNKINETIC ER) 10-8 MG/5ML SUER Take 5 mLs by mouth 2 (two) times daily. Patient not taking: Reported on 12/19/2016 05/21/15   Sable Feil, PA-C  famotidine (PEPCID) 20 MG tablet Take 1 tablet (20 mg total) by mouth 2 (two) times daily. Patient not taking: Reported on 12/19/2016 06/05/16   Carrie Mew, MD  HYDROcodone-acetaminophen [facility]) 5-325 MG per tablet Take 2 tablets by mouth every 6 (six) hours as needed for moderate pain. Patient not taking: Reported on 12/19/2016 01/20/15   Roseanne Kaufman, MD  ketorolac (TORADOL) 10 MG tablet Take 1 tablet (10 mg total) by mouth every 6 (six) hours as needed. Patient not taking: Reported on 12/19/2016 05/21/15   Sable Feil, PA-C  ondansetron (ZOFRAN ODT) 4 MG disintegrating tablet Take 1 tablet (4 mg total) by mouth every 8 (eight) hours as needed for nausea or vomiting. Patient not taking: Reported on 12/19/2016 06/05/16   Carrie Mew, MD  phenazopyridine (PYRIDIUM) 200 MG tablet Take  1 tablet (200 mg total) by mouth 3 (three) times daily as needed for pain. Patient not taking: Reported on 12/19/2016 12/14/16 12/14/17  Alexandra Pear, MD      VITAL SIGNS:  Blood pressure (!) 124/53, pulse (!) 111, temperature 98.3 F (36.8 C), temperature source Oral, resp. rate 18, height 5\' 7"  (1.702 m), weight 63.5 kg (140 lb), SpO2 97 %.  PHYSICAL EXAMINATION:  Physical Exam  GENERAL:  71 y.o.-year-old patient lying in the bed in no acute distress.  EYES: Pupils equal, round, reactive to light and accommodation. No scleral icterus. Extraocular muscles intact.  HEENT: Head atraumatic, normocephalic. Oropharynx and nasopharynx clear. No oropharyngeal erythema, moist oral mucosa  NECK:  Supple, no jugular venous distention. No thyroid enlargement, no tenderness.  LUNGS: Normal breath sounds bilaterally, no wheezing, rales, rhonchi. No use of accessory muscles of respiration.  CARDIOVASCULAR: S1, S2 RRR. No murmurs,  rubs, gallops, clicks.  ABDOMEN: Soft, nontender, nondistended. Bowel sounds present. No organomegaly or mass.  EXTREMITIES: No pedal edema, cyanosis, or clubbing. + 2 pedal & radial pulses b/l.   NEUROLOGIC: Cranial nerves II through XII are intact. No focal Motor or sensory deficits appreciated b/l PSYCHIATRIC: The patient is alert and oriented x 3.  SKIN: No obvious rash, lesion, or ulcer.   LABORATORY PANEL:   CBC  Recent Labs Lab 12/19/16 1647  WBC 15.3*  HGB 12.3  HCT 36.9  PLT 236   ------------------------------------------------------------------------------------------------------------------  Chemistries   Recent Labs Lab 12/19/16 1647  NA 140  K 3.9  CL 105  CO2 26  GLUCOSE 169*  BUN 15  CREATININE 0.76  CALCIUM 9.9  AST 21  ALT 17  ALKPHOS 63  BILITOT 0.5   ------------------------------------------------------------------------------------------------------------------  Cardiac Enzymes No results for input(s): TROPONINI in the last 168 hours. ------------------------------------------------------------------------------------------------------------------  RADIOLOGY:  No results found.   IMPRESSION AND PLAN:    71 y.o. female with a known history of Osteoarthritis, history of endometrial cancer, COPD with ongoing tobacco abuse, diabetes, diabetic neuropathy, hypertension who presents to the hospital due to fevers, chills, dysuria, frequency. She was diagnosed with a UTI and started on oral Keflex but urine cultures are positive for ESBL and so she is being admitted for sepsis secondary to UTI.   1. Sepsis-patient meets criteria given her leukocytosis, fever, tachycardia and a positive urinalysis and urine culture. -The source patient's ESBL urinary tract infection. We'll treat the patient with IV meropenem, follow blood, urine cultures.  2. Urinary tract infection-patient's urine cultures are positive for ESBL. -We'll treat the patient with IV  meropenem. Follow sensitivities. - We'll place on pyridium for dysuria and bladder spasms.  3. Leukocytosis-secondary to the sepsis and UTI. Follow with IV antibiotic therapy.  4. Diabetes type 2 without complication-continue Levemir, metformin, victoza.   - cont. SSI and follow BS  5. Hyperlipidemia-continue simvastatin.  6. Diabetic neuropathy-continue Lyrica.  7. Essential hypertension-continue lisinopril  8. OA - cont. Meloxicam.      All the records are reviewed and case discussed with ED provider. Management plans discussed with the patient, family and they are in agreement.  CODE STATUS: Full code  TOTAL TIME TAKING CARE OF THIS PATIENT: 45 minutes.    Henreitta Leber M.D on 12/19/2016 at 7:50 PM  Between 7am to 6pm - Pager - [phone]  After 6pm go to [email or web address] - password [email or web address]  [facility]  Office  [phone]  CC: Primary care physician; Lavera Guise, MD

## 2016-12-20 LAB — CBC
HCT: 31 % — ABNORMAL LOW (ref 35.0–47.0)
Hemoglobin: 10.6 g/dL — ABNORMAL LOW (ref 12.0–16.0)
MCH: 31.3 pg (ref 26.0–34.0)
MCHC: 34.2 g/dL (ref 32.0–36.0)
MCV: 91.6 fL (ref 80.0–100.0)
PLATELETS: 198 10*3/uL (ref 150–440)
RBC: 3.38 MIL/uL — AB (ref 3.80–5.20)
RDW: 12.6 % (ref 11.5–14.5)
WBC: 9 10*3/uL (ref 3.6–11.0)

## 2016-12-20 LAB — GLUCOSE, CAPILLARY
GLUCOSE-CAPILLARY: 141 mg/dL — AB (ref 65–99)
GLUCOSE-CAPILLARY: 166 mg/dL — AB (ref 65–99)
GLUCOSE-CAPILLARY: 151 mg/dL — AB (ref 65–99)
Glucose-Capillary: 107 mg/dL — ABNORMAL HIGH (ref 65–99)

## 2016-12-20 LAB — BASIC METABOLIC PANEL
Anion gap: 7 (ref 5–15)
BUN: 12 mg/dL (ref 6–20)
CALCIUM: 8.7 mg/dL — AB (ref 8.9–10.3)
CO2: 23 mmol/L (ref 22–32)
CREATININE: 0.66 mg/dL (ref 0.44–1.00)
Chloride: 110 mmol/L (ref 101–111)
Glucose, Bld: 139 mg/dL — ABNORMAL HIGH (ref 65–99)
Potassium: 3.4 mmol/L — ABNORMAL LOW (ref 3.5–5.1)
SODIUM: 140 mmol/L (ref 135–145)

## 2016-12-20 MED ORDER — IBUPROFEN 400 MG PO TABS
800.0000 mg | ORAL_TABLET | Freq: Four times a day (QID) | ORAL | Status: DC | PRN
  Administered 2016-12-20 (×3): 800 mg via ORAL
  Filled 2016-12-20 (×3): qty 2

## 2016-12-20 MED ORDER — INSULIN GLARGINE 100 UNIT/ML ~~LOC~~ SOLN
12.0000 [IU] | Freq: Every evening | SUBCUTANEOUS | Status: DC
  Filled 2016-12-20 (×2): qty 0.12

## 2016-12-20 MED ORDER — SODIUM CHLORIDE 0.9 % IV SOLN: 1.0000 g | Freq: Three times a day (TID) | INTRAVENOUS | 0 refills | Status: DC

## 2016-12-20 NOTE — Progress Notes (Signed)
[place] at [place] NAME: Alexandra Yang    MR#:  [id number]  DATE OF BIRTH:  Feb 25, 1946  SUBJECTIVE: Admitted for ESBL UTI. On IV meropenem. Low-grade temperature today. No other complaints. No nausea.   CHIEF COMPLAINT:   Chief Complaint  Patient presents with  . Urinary Tract Infection    REVIEW OF SYSTEMS:   ROS CONSTITUTIONAL: No fever, fatigue or weakness.  EYES: No blurred or double vision.  EARS, NOSE, AND THROAT: No tinnitus or ear pain.  RESPIRATORY: No cough, shortness of breath, wheezing or hemoptysis.  CARDIOVASCULAR: No chest pain, orthopnea, edema.  GASTROINTESTINAL: No nausea, vomiting, diarrhea or abdominal pain.  GENITOURINARY: No dysuria, hematuria.  ENDOCRINE: No polyuria, nocturia,  HEMATOLOGY: No anemia, easy bruising or bleeding SKIN: No rash or lesion. MUSCULOSKELETAL: No joint pain or arthritis.   NEUROLOGIC: No tingling, numbness, weakness.  PSYCHIATRY: No anxiety or depression.   DRUG ALLERGIES:   Allergies  Allergen Reactions  . Percodan [Oxycodone-Aspirin] Other (See Comments)    hallucinations  . Ciprofloxacin Rash and Other (See Comments)    Feels like body is on fire  . Naproxen Rash  . Ultram [Tramadol] Rash    VITALS:  Blood pressure (!) 123/53, pulse 73, temperature 98.6 F (37 C), temperature source Oral, resp. rate 18, height 5\' 7"  (1.702 m), weight 63.5 kg (140 lb), SpO2 100 %.  PHYSICAL EXAMINATION:  GENERAL:  71 y.o.-year-old patient lying in the bed with no acute distress.  EYES: Pupils equal, round, reactive to light and accommodation. No scleral icterus. Extraocular muscles intact.  HEENT: Head atraumatic, normocephalic. Oropharynx and nasopharynx clear.  NECK:  Supple, no jugular venous distention. No thyroid enlargement, no tenderness.  LUNGS: Normal breath sounds bilaterally, no wheezing, rales,rhonchi or crepitation. No use of accessory muscles of respiration.   CARDIOVASCULAR: S1, S2 normal. No murmurs, rubs, or gallops.  ABDOMEN: Soft, nontender, nondistended. Bowel sounds present. No organomegaly or mass.  EXTREMITIES: No pedal edema, cyanosis, or clubbing.  NEUROLOGIC: Cranial nerves II through XII are intact. Muscle strength 5/5 in all extremities. Sensation intact. Gait not checked.  PSYCHIATRIC: The patient is alert and oriented x 3.  SKIN: No obvious rash, lesion, or ulcer.    LABORATORY PANEL:   CBC  Recent Labs Lab 12/20/16 0355  WBC 9.0  HGB 10.6*  HCT 31.0*  PLT 198   ------------------------------------------------------------------------------------------------------------------  Chemistries   Recent Labs Lab 12/19/16 1647 12/20/16 0355  NA 140 140  K 3.9 3.4*  CL 105 110  CO2 26 23  GLUCOSE 169* 139*  BUN 15 12  CREATININE 0.76 0.66  CALCIUM 9.9 8.7*  AST 21  --   ALT 17  --   ALKPHOS 63  --   BILITOT 0.5  --    ------------------------------------------------------------------------------------------------------------------  Cardiac Enzymes No results for input(s): TROPONINI in the last 168 hours. ------------------------------------------------------------------------------------------------------------------  RADIOLOGY:  No results found.  EKG:   Orders placed or performed during the hospital encounter of 01/20/15  . EKG 12 lead  . EKG 12 lead    ASSESSMENT AND PLAN:    1. ESBL UTI. Patient was here a few days ago and was discharged with Keflex, called by lab because of ESBL and need for IV antibiotics. Patient told me that she had history of ESBL, left kidney stone 4 years ago, patient followed up with Dr. Parks [place] time. Still has stone in the left kidney but she has no flank pain or back  pain .no intervention done for the left kidney stone. She denies dysuria now. Continue IV meropenem, get a PICC line, possible discharge home tomorrow with home nurse IV Abx. Status is improving, WBC is down from  15-9.  2.. Diabetes mellitus type 2; resumed Lantus, metformin, Victoza. #3. essential hypertension; continue lisinopril. #4. history of diabetic neuropathy: Continue Lyrica.     All the records are reviewed and case discussed with Care Management/Social Workerr. Management plans discussed with the patient, family and they are in agreement.  CODE STATUS: full  TOTAL TIME TAKING CARE OF THIS PATIENT: 35 minutes.   POSSIBLE D/C IN 1-2DAYS, DEPENDING ON CLINICAL CONDITION.   Epifanio Lesches M.D on 12/20/2016 at 10:18 AM  Between 7am to 6pm - Pager - [phone]  After 6pm go to [email or web address] - password [email or web address] [facility]  Office  [phone]  CC: Primary care physician; Lavera Guise, MD   Note: This dictation was prepared with Dragon dictation along with smaller phrase technology. Any transcriptional errors that result from this process are unintentional.

## 2016-12-20 NOTE — Progress Notes (Signed)
Contacted vascular wellness for picc line insertion

## 2016-12-20 NOTE — Care Management Note (Signed)
Case Management Note  Patient Details  Name: KENNIDY LAMKE MRN: [id number] Date of Birth: Oct 25, 1945  Subjective/Objective:   Meropenum prescription faxed to the [facility] was notified. Anticipate discharge home tomorrow with home health RN.                  Action/Plan:   Expected Discharge Date:    12/21/16              Expected Discharge Plan:     In-House Referral:     Discharge [place] with IV antibiotics  Post Acute Care Choice:    Choice offered to:     DME Arranged:    DME Agency:     HH Arranged:   RN [place] Agency:   [facility]  Status of Service:     If discussed at [place] of Stay Meetings, dates discussed:    Additional Comments:  Cesare Sumlin A, RN 12/20/2016, 12:57 PM

## 2016-12-21 LAB — URINE CULTURE
Culture: >=100000 — AB
Culture: NO GROWTH

## 2016-12-21 LAB — GLUCOSE, CAPILLARY
GLUCOSE-CAPILLARY: 110 mg/dL — AB (ref 65–99)
Glucose-Capillary: 119 mg/dL — ABNORMAL HIGH (ref 65–99)

## 2016-12-21 MED ORDER — SODIUM CHLORIDE 0.9 % IV SOLN
1.0000 g | INTRAVENOUS | Status: DC
  Administered 2016-12-21: 1 g via INTRAVENOUS
  Filled 2016-12-21: qty 1

## 2016-12-21 MED ORDER — SODIUM CHLORIDE 0.9 % IV SOLN: 1.0000 g | INTRAVENOUS | 0 refills | Status: DC

## 2016-12-21 MED ORDER — INSULIN GLARGINE 100 UNIT/ML ~~LOC~~ SOLN: 12.0000 [IU] | Freq: Every evening | SUBCUTANEOUS | 11 refills | Status: DC

## 2016-12-21 NOTE — Progress Notes (Signed)
     Alexandra Yang was admitted to the Hospital on 12/19/2016 and Discharged  12/21/2016 and should be excused from work/school   for 5  days starting 12/19/2016 , may return to work/school without any restrictions.    Epifanio Lesches M.D on 12/21/2016,at 9:07 AM

## 2016-12-21 NOTE — Progress Notes (Signed)
Patient is alert and oriented and able to verbalize needs. No complaints of pain. Vital signs stable. PIV removed. Discharge instructions gone over with patient. [place] set up by RNCM. Patient verbalizes understanding of all discharge instructions and follow up care. No concerns voiced. Family call for transportation home.   Bethann Punches

## 2016-12-21 NOTE — Progress Notes (Signed)
[place] at [place] NAME: Atavia Poppe    MR#:  [id number]  DATE OF BIRTH:  1945/11/04  SUBJECTIVE: Admitted for ESBL UTI. Discharge home with IV Invanz 1 g daily today.   CHIEF COMPLAINT:   Chief Complaint  Patient presents with  . Urinary Tract Infection    REVIEW OF SYSTEMS:   ROS CONSTITUTIONAL: No fever, fatigue or weakness.  EYES: No blurred or double vision.  EARS, NOSE, AND THROAT: No tinnitus or ear pain.  RESPIRATORY: No cough, shortness of breath, wheezing or hemoptysis.  CARDIOVASCULAR: No chest pain, orthopnea, edema.  GASTROINTESTINAL: No nausea, vomiting, diarrhea or abdominal pain.  GENITOURINARY: No dysuria, hematuria.  ENDOCRINE: No polyuria, nocturia,  HEMATOLOGY: No anemia, easy bruising or bleeding SKIN: No rash or lesion. MUSCULOSKELETAL: No joint pain or arthritis.   NEUROLOGIC: No tingling, numbness, weakness.  PSYCHIATRY: No anxiety or depression.   DRUG ALLERGIES:   Allergies  Allergen Reactions  . Percodan [Oxycodone-Aspirin] Other (See Comments)    hallucinations  . Ciprofloxacin Rash and Other (See Comments)    Feels like body is on fire  . Naproxen Rash  . Ultram [Tramadol] Rash    VITALS:  Blood pressure (!) 118/58, pulse 78, temperature 98 F (36.7 C), temperature source Oral, resp. rate 18, height 5\' 7"  (1.702 m), weight 63.5 kg (140 lb), SpO2 95 %.  PHYSICAL EXAMINATION:  GENERAL:  71 y.o.-year-old patient lying in the bed with no acute distress.  EYES: Pupils equal, round, reactive to light and accommodation. No scleral icterus. Extraocular muscles intact.  HEENT: Head atraumatic, normocephalic. Oropharynx and nasopharynx clear.  NECK:  Supple, no jugular venous distention. No thyroid enlargement, no tenderness.  LUNGS: Normal breath sounds bilaterally, no wheezing, rales,rhonchi or crepitation. No use of accessory muscles of respiration.  CARDIOVASCULAR: S1, S2 normal. No  murmurs, rubs, or gallops.  ABDOMEN: Soft, nontender, nondistended. Bowel sounds present. No organomegaly or mass.  EXTREMITIES: No pedal edema, cyanosis, or clubbing.  NEUROLOGIC: Cranial nerves II through XII are intact. Muscle strength 5/5 in all extremities. Sensation intact. Gait not checked.  PSYCHIATRIC: The patient is alert and oriented x 3.  SKIN: No obvious rash, lesion, or ulcer.    LABORATORY PANEL:   CBC  Recent Labs Lab 12/20/16 0355  WBC 9.0  HGB 10.6*  HCT 31.0*  PLT 198   ------------------------------------------------------------------------------------------------------------------  Chemistries   Recent Labs Lab 12/19/16 1647 12/20/16 0355  NA 140 140  K 3.9 3.4*  CL 105 110  CO2 26 23  GLUCOSE 169* 139*  BUN 15 12  CREATININE 0.76 0.66  CALCIUM 9.9 8.7*  AST 21  --   ALT 17  --   ALKPHOS 63  --   BILITOT 0.5  --    ------------------------------------------------------------------------------------------------------------------  Cardiac Enzymes No results for input(s): TROPONINI in the last 168 hours. ------------------------------------------------------------------------------------------------------------------  RADIOLOGY:  No results found.  EKG:   Orders placed or performed during the hospital encounter of 01/20/15  . EKG 12 lead  . EKG 12 lead    ASSESSMENT AND PLAN:    1. ESBL UTI. Patient was here a few days ago and was discharged with Keflex, called by lab because of ESBL and need for IV antibiotics. Patient told me that she had history of ESBL, left kidney stone 4 years ago, patient followed up with [facility]. That time. Still has stone in the left kidney but she has no flank pain or back pain .no  intervention done for the left kidney stone. She denies dysuria now. Discharge home today with Invanz 1 g daily, for 14 days, repeat urine cultures as an outpatient afterfinishing the treatment.  Work note given.  Dmii;continue  metformin, Victoza. #3. essential hypertension; continue lisinopril. #4. history of diabetic neuropathy: Continue Lyrica.     All the records are reviewed and case discussed with Care Management/Social Workerr. Management plans discussed with the patient, family and they are in agreement.  CODE STATUS: full  TOTAL TIME TAKING CARE OF THIS PATIENT: 35 minutes.   Discharge  home today with home health nurse for IV antibiotics for 2 weeks. Did receive PICC line yesterday.   Epifanio Lesches M.D on 12/21/2016 at 9:08 AM  Between 7am to 6pm - Pager - [phone]  After 6pm go to [email or web address] - password [email or web address] [facility]  Office  [phone]  CC: Primary care physician; Lavera Guise, MD   Note: This dictation was prepared with Dragon dictation along with smaller phrase technology. Any transcriptional errors that result from this process are unintentional.

## 2016-12-21 NOTE — Care Management Note (Signed)
Case Management Note  Patient Details  Name: Alexandra Yang MRN: [id number] Date of Birth: 1946/01/19  Subjective/Objective:     Ms Valrie Hart nurse notified case manager that home IV antibiotic of meropenem written yesterday and set to the [place], had been changed today to IV ertapenem. This Probation officer called Dr Vianne Bulls to confirm and to request a new order to send to the [facility]. This was done and Brad at [facility] will follow up with the [facility] pharmacy to make sure that Mrs Rando receives the most recently ordered IV antibiotic.               Action/Plan:   Expected Discharge Date:  12/21/16               Expected Discharge Plan:     In-House Referral:     Discharge planning Services     Post Acute Care Choice:    Choice offered to:     DME Arranged:    DME Agency:     HH Arranged:    HH Agency:     Status of Service:     If discussed at [employer], dates discussed:    Additional Comments:  Angeles Paolucci A, RN 12/21/2016, 10:39 AM

## 2016-12-21 NOTE — Progress Notes (Signed)
Family arrived to transport patient home. Patient taken to front by NT via wc.   Bethann Punches, RN

## 2016-12-22 DIAGNOSIS — A419 Sepsis, unspecified organism: Secondary | ICD-10-CM | POA: Diagnosis not present

## 2016-12-22 DIAGNOSIS — E785 Hyperlipidemia, unspecified: Secondary | ICD-10-CM | POA: Diagnosis not present

## 2016-12-22 DIAGNOSIS — E1142 Type 2 diabetes mellitus with diabetic polyneuropathy: Secondary | ICD-10-CM | POA: Diagnosis not present

## 2016-12-22 DIAGNOSIS — J449 Chronic obstructive pulmonary disease, unspecified: Secondary | ICD-10-CM | POA: Diagnosis not present

## 2016-12-22 DIAGNOSIS — M810 Age-related osteoporosis without current pathological fracture: Secondary | ICD-10-CM | POA: Diagnosis not present

## 2016-12-22 DIAGNOSIS — F17210 Nicotine dependence, cigarettes, uncomplicated: Secondary | ICD-10-CM | POA: Diagnosis not present

## 2016-12-22 DIAGNOSIS — I10 Essential (primary) hypertension: Secondary | ICD-10-CM | POA: Diagnosis not present

## 2016-12-22 DIAGNOSIS — Z452 Encounter for adjustment and management of vascular access device: Secondary | ICD-10-CM | POA: Diagnosis not present

## 2016-12-22 DIAGNOSIS — N390 Urinary tract infection, site not specified: Secondary | ICD-10-CM | POA: Diagnosis not present

## 2016-12-22 NOTE — Discharge Summary (Signed)
Alexandra Yang, is a 71 y.o. female  DOB 01/12/1946  MRN [id number].  Admission date:  12/19/2016  Admitting Physician  Henreitta Leber, MD  Discharge Date:  12/21/2016   Primary MD  Lavera Guise, MD  Recommendations for primary care physician for things to follow:   Follow-up with primary doctor in 1 week   Admission Diagnosis  Pyelonephritis [N12] Sepsis, due to unspecified organism [facility]) [A41.9]   Discharge Diagnosis  Pyelonephritis [N12] Sepsis, due to unspecified organism [facility]) [A41.9]    Active Problems:   Sepsis [facility])      Past Medical History:  Diagnosis Date  . Arthritis    osteoarthritis  . Cancer ([place]) 1995   endometrial ca  . COPD (chronic obstructive pulmonary disease) ([place])   . Diabetes mellitus without complication ([place])    type 2  . GERD (gastroesophageal reflux disease)   . History of kidney infection    had to have PICC line  . Neuropathy    both feet  . Osteopenia   . Osteoporosis   . Pneumonia     Past Surgical History:  Procedure Laterality Date  . ABDOMINAL HYSTERECTOMY    . CHOLECYSTECTOMY    . KNEE ARTHROSCOPY Right   . TENDON TRANSFER Right 01/20/2015   Procedure: RIGHT HAND AND MIDDLE FINGER TENDON RECONSTRUCTION AND TRANSFER;  Surgeon: Roseanne Kaufman, MD;  Location: [facility];  Service: Orthopedics;  Laterality: Right;       History of present illness and  Hospital Course:     Kindly see H&P for history of present illness and admission details, please review complete Labs, Consult reports and Test reports for all details in brief  HPI  from the history and physical done on the day of admission 71 year old female patient admitted because of ESBL in urine. Patient was seen in the emergency room before and was discharged with Keflex but urine cultures grewESBL so she was called  in for IV antibiotics. Patient had fever 101.7 at home associated with nausea, vomiting. No flank pain. Patient does have chronic back pain.. Patient had frequency of urine, foul smelling urine.   Hospital Course    ESBL UTI. Patient was here a few days ago and was discharged with Keflex, called by lab because of ESBL and need for IV antibiotics. Patient told me that she had history of ESBL, left kidney stone 4 years ago, patient followed up with Dr.cope. That time. Still has stone in the left kidney but she has no flank pain or back pain .no intervention done for the left kidney stone. She denies dysuria now. Discharge home today with Invanz 1 g daily, for 14 days, repeat urine cultures as an outpatient afterfinishing the treatment. WBC on admission 15.3, came down to 9 the following day. And patient did not have any fever. Blood cultures did not show any infection. Urine culture showed ESBL but sensitive to IV Invanz and patient is discharged home with Invanz 1 g IV daily, and arranged home health nurse for IV antibiotics. Patient did receive PICC line.  Work note given 2.Dmii;continue metformin, Victoza. #3. essential hypertension; continue lisinopril. #4. history of diabetic neuropathy: Continue Lyrica   Discharge Condition: stable   Follow UP      Discharge Instructions  and  Discharge Medications   Allergies as of 12/21/2016      Reactions   Percodan [oxycodone-aspirin] Other (See Comments)   hallucinations   Ciprofloxacin Rash, Other (See Comments)   Feels like  body is on fire   Naproxen Rash   Ultram [tramadol] Rash      Medication List    STOP taking these medications   cephALEXin 500 MG capsule Commonly known as:  KEFLEX   chlorpheniramine-HYDROcodone 10-8 MG/5ML Suer Commonly known as:  TUSSIONEX PENNKINETIC ER   HYDROcodone-acetaminophen 5-325 MG tablet Commonly known as:  NORCO   ketorolac 10 MG tablet Commonly known as:  TORADOL   ondansetron 4 MG  disintegrating tablet Commonly known as:  ZOFRAN ODT   phenazopyridine 200 MG tablet Commonly known as:  PYRIDIUM     TAKE these medications   albuterol (2.5 MG/3ML) 0.083% nebulizer solution Commonly known as:  PROVENTIL Take 2.5 mg by nebulization every 6 (six) hours as needed for wheezing or shortness of breath.   aspirin 81 MG chewable tablet Chew 81 mg by mouth every evening.   CENTRUM SILVER PO Take 1 tablet by mouth daily.   cholecalciferol 1000 units tablet Commonly known as:  VITAMIN D Take 1,000 Units by mouth daily.   ertapenem 1 g in sodium chloride 0.9 % 50 mL Inject 1 g into the vein daily.   famotidine 20 MG tablet Commonly known as:  PEPCID Take 1 tablet (20 mg total) by mouth 2 (two) times daily.   ibuprofen 800 MG tablet Commonly known as:  ADVIL,MOTRIN Take 800 mg by mouth 3 (three) times daily as needed.   insulin glargine 100 UNIT/ML injection Commonly known as:  LANTUS Inject 0.12 mLs (12 Units total) into the skin every evening. What changed:  how much to take   lisinopril 5 MG tablet Commonly known as:  PRINIVIL,ZESTRIL Take 5 mg by mouth daily.   meloxicam 7.5 MG tablet Commonly known as:  MOBIC Take 7.5 mg by mouth daily as needed for pain.   metFORMIN 500 MG 24 hr tablet Commonly known as:  GLUCOPHAGE-XR Take 500 mg by mouth daily.   metoCLOPramide 10 MG tablet Commonly known as:  REGLAN Take 10 mg by mouth daily as needed.   ondansetron 4 MG tablet Commonly known as:  ZOFRAN Take 1 tablet (4 mg total) by mouth every 8 (eight) hours as needed for nausea or vomiting.   pregabalin 75 MG capsule Commonly known as:  LYRICA Take 75 mg by mouth at bedtime as needed (pain).   simvastatin 40 MG tablet Commonly known as:  ZOCOR Take 40 mg by mouth at bedtime.   VICTOZA 18 MG/3ML Sopn Generic drug:  liraglutide Inject 1.8 Units into the skin every morning.   vitamin B-12 1000 MCG tablet Commonly known as:  CYANOCOBALAMIN Take  1,000 mcg by mouth daily.         Diet and Activity recommendation: See Discharge Instructions above   Consults obtained -picc line team   Major procedures and Radiology Reports - PLEASE review detailed and final reports for all details, in brief -      No results found.  Micro Results    Recent Results (from the past 240 hour(s))  Urine Culture     Status: Abnormal   Collection Time: 12/14/16  1:55 PM  Result Value Ref Range Status   Specimen Description URINE, RANDOM  Final   Special Requests NONE  Final   Culture (A)  Final    >=100,000 COLONIES/mL ESCHERICHIA COLI Confirmed Extended Spectrum Beta-Lactamase Producer (ESBL) Performed at [facility], [address], [place], [place] [address]    Report Status 12/21/2016 FINAL  Final   Organism ID,  Bacteria ESCHERICHIA COLI (A)  Final      Susceptibility   Escherichia coli - MIC*    AMPICILLIN >=32 RESISTANT Resistant     CEFAZOLIN >=64 RESISTANT Resistant     CEFTRIAXONE >=64 RESISTANT Resistant     CIPROFLOXACIN >=4 RESISTANT Resistant     GENTAMICIN >=16 RESISTANT Resistant     IMIPENEM <=0.25 SENSITIVE Sensitive     NITROFURANTOIN <=16 SENSITIVE Sensitive     TRIMETH/SULFA >=320 RESISTANT Resistant     AMPICILLIN/SULBACTAM >=32 RESISTANT Resistant     PIP/TAZO 8 SENSITIVE Sensitive     Extended ESBL POSITIVE Resistant     ERTAPENEM Value in next row Sensitive      SENSITIVE<=0.5    * >=100,000 COLONIES/mL ESCHERICHIA COLI  Blood Culture (routine x 2)     Status: None (Preliminary result)   Collection Time: 12/19/16  7:28 PM  Result Value Ref Range Status   Specimen Description BLOOD RIGHT HAND  Final   Special Requests   Final    BOTTLES DRAWN AEROBIC AND ANAEROBIC Blood Culture adequate volume   Culture NO GROWTH 3 DAYS  Final   Report Status PENDING  Incomplete  Blood Culture (routine x 2)     Status: None (Preliminary result)   Collection Time: 12/19/16  7:28 PM  Result Value Ref Range  Status   Specimen Description BLOOD RIGHT HAND  Final   Special Requests   Final    BOTTLES DRAWN AEROBIC AND ANAEROBIC Blood Culture adequate volume   Culture NO GROWTH 3 DAYS  Final   Report Status PENDING  Incomplete  Urine culture     Status: None   Collection Time: 12/19/16  9:40 PM  Result Value Ref Range Status   Specimen Description URINE, RANDOM  Final   Special Requests NONE  Final   Culture   Final    NO GROWTH Performed at [facility], [address], [place], [place] [address]    Report Status 12/21/2016 FINAL  Final       Today   Subjective:   Alexandra Yang today has No fever. Eager to go home.  Objective:   Blood pressure (!) 118/58, pulse 78, temperature 98 F (36.7 C), temperature source Oral, resp. rate 18, height 5\' 7"  (1.702 m), weight 63.5 kg (140 lb), SpO2 95 %.  No intake or output data in the 24 hours ending 12/22/16 1033  Exam Awake Alert, Oriented x 3, No new F.N deficits, Normal affect [place].AT,PERRAL Supple Neck,No JVD, No cervical lymphadenopathy appriciated.  Symmetrical Chest wall movement, Good air movement bilaterally, CTAB RRR,No Gallops,Rubs or new Murmurs, No Parasternal Heave +ve B.Sounds, Abd Soft, Non tender, No organomegaly appriciated, No rebound -guarding or rigidity. No Cyanosis, Clubbing or edema, No new Rash or bruise  Data Review   CBC w Diff:  Lab Results  Component Value Date   WBC 9.0 12/20/2016   HGB 10.6 (L) 12/20/2016   HGB 14.9 04/04/2014   HCT 31.0 (L) 12/20/2016   HCT 45.5 04/04/2014   PLT 198 12/20/2016   PLT 248 04/04/2014   LYMPHOPCT 7 12/19/2016   LYMPHOPCT 34.1 04/04/2014   MONOPCT 3 12/19/2016   MONOPCT 6.1 04/04/2014   EOSPCT 0 12/19/2016   EOSPCT 1.9 04/04/2014   BASOPCT 0 12/19/2016   BASOPCT 0.4 04/04/2014    CMP:  Lab Results  Component Value Date   NA 140 12/20/2016   NA 140 04/04/2014   K 3.4 (L) 12/20/2016   K 3.9  04/04/2014   CL 110 12/20/2016   CL 104 04/04/2014   CO2  23 12/20/2016   CO2 28 04/04/2014   BUN 12 12/20/2016   BUN 16 04/04/2014   CREATININE 0.66 12/20/2016   CREATININE 0.61 04/04/2014   PROT 7.8 12/19/2016   PROT 8.5 (H) 04/04/2014   ALBUMIN 3.8 12/19/2016   ALBUMIN 4.3 04/04/2014   BILITOT 0.5 12/19/2016   BILITOT 0.3 04/04/2014   ALKPHOS 63 12/19/2016   ALKPHOS 110 04/04/2014   AST 21 12/19/2016   AST 17 04/04/2014   ALT 17 12/19/2016   ALT 27 04/04/2014  .   Total Time in preparing paper work, data evaluation and todays exam - 37 minutes  Emeline Simpson M.D on 12/21/2016 at 10:33 AM    Note: This dictation was prepared with Dragon dictation along with smaller phrase technology. Any transcriptional errors that result from this process are unintentional.

## 2016-12-24 LAB — CULTURE, BLOOD (ROUTINE X 2)
CULTURE: NO GROWTH
Culture: NO GROWTH
Special Requests: ADEQUATE
Special Requests: ADEQUATE

## 2016-12-27 DIAGNOSIS — B9620 Unspecified Escherichia coli [E. coli] as the cause of diseases classified elsewhere: Secondary | ICD-10-CM | POA: Diagnosis not present

## 2016-12-27 DIAGNOSIS — N390 Urinary tract infection, site not specified: Secondary | ICD-10-CM | POA: Diagnosis not present

## 2016-12-29 DIAGNOSIS — A419 Sepsis, unspecified organism: Secondary | ICD-10-CM | POA: Diagnosis not present

## 2016-12-29 DIAGNOSIS — Z452 Encounter for adjustment and management of vascular access device: Secondary | ICD-10-CM | POA: Diagnosis not present

## 2016-12-29 DIAGNOSIS — I10 Essential (primary) hypertension: Secondary | ICD-10-CM | POA: Diagnosis not present

## 2016-12-29 DIAGNOSIS — E785 Hyperlipidemia, unspecified: Secondary | ICD-10-CM | POA: Diagnosis not present

## 2016-12-29 DIAGNOSIS — N390 Urinary tract infection, site not specified: Secondary | ICD-10-CM | POA: Diagnosis not present

## 2016-12-29 DIAGNOSIS — F17210 Nicotine dependence, cigarettes, uncomplicated: Secondary | ICD-10-CM | POA: Diagnosis not present

## 2016-12-29 DIAGNOSIS — M810 Age-related osteoporosis without current pathological fracture: Secondary | ICD-10-CM | POA: Diagnosis not present

## 2016-12-29 DIAGNOSIS — E1142 Type 2 diabetes mellitus with diabetic polyneuropathy: Secondary | ICD-10-CM | POA: Diagnosis not present

## 2016-12-29 DIAGNOSIS — J449 Chronic obstructive pulmonary disease, unspecified: Secondary | ICD-10-CM | POA: Diagnosis not present

## 2017-01-05 DIAGNOSIS — F17210 Nicotine dependence, cigarettes, uncomplicated: Secondary | ICD-10-CM | POA: Diagnosis not present

## 2017-01-05 DIAGNOSIS — Z452 Encounter for adjustment and management of vascular access device: Secondary | ICD-10-CM | POA: Diagnosis not present

## 2017-01-05 DIAGNOSIS — E785 Hyperlipidemia, unspecified: Secondary | ICD-10-CM | POA: Diagnosis not present

## 2017-01-05 DIAGNOSIS — E1142 Type 2 diabetes mellitus with diabetic polyneuropathy: Secondary | ICD-10-CM | POA: Diagnosis not present

## 2017-01-05 DIAGNOSIS — J449 Chronic obstructive pulmonary disease, unspecified: Secondary | ICD-10-CM | POA: Diagnosis not present

## 2017-01-05 DIAGNOSIS — A419 Sepsis, unspecified organism: Secondary | ICD-10-CM | POA: Diagnosis not present

## 2017-01-05 DIAGNOSIS — M810 Age-related osteoporosis without current pathological fracture: Secondary | ICD-10-CM | POA: Diagnosis not present

## 2017-01-05 DIAGNOSIS — N390 Urinary tract infection, site not specified: Secondary | ICD-10-CM | POA: Diagnosis not present

## 2017-01-05 DIAGNOSIS — I10 Essential (primary) hypertension: Secondary | ICD-10-CM | POA: Diagnosis not present

## 2017-01-06 DIAGNOSIS — J441 Chronic obstructive pulmonary disease with (acute) exacerbation: Secondary | ICD-10-CM | POA: Diagnosis not present

## 2017-01-06 DIAGNOSIS — N390 Urinary tract infection, site not specified: Secondary | ICD-10-CM | POA: Diagnosis not present

## 2017-01-06 DIAGNOSIS — F17210 Nicotine dependence, cigarettes, uncomplicated: Secondary | ICD-10-CM | POA: Diagnosis not present

## 2017-01-06 DIAGNOSIS — I10 Essential (primary) hypertension: Secondary | ICD-10-CM | POA: Diagnosis not present

## 2017-01-06 DIAGNOSIS — R110 Nausea: Secondary | ICD-10-CM | POA: Diagnosis not present

## 2017-01-06 DIAGNOSIS — B9620 Unspecified Escherichia coli [E. coli] as the cause of diseases classified elsewhere: Secondary | ICD-10-CM | POA: Diagnosis not present

## 2017-01-12 DIAGNOSIS — E785 Hyperlipidemia, unspecified: Secondary | ICD-10-CM | POA: Diagnosis not present

## 2017-01-12 DIAGNOSIS — F17210 Nicotine dependence, cigarettes, uncomplicated: Secondary | ICD-10-CM | POA: Diagnosis not present

## 2017-01-12 DIAGNOSIS — A419 Sepsis, unspecified organism: Secondary | ICD-10-CM | POA: Diagnosis not present

## 2017-01-12 DIAGNOSIS — Z452 Encounter for adjustment and management of vascular access device: Secondary | ICD-10-CM | POA: Diagnosis not present

## 2017-01-12 DIAGNOSIS — I10 Essential (primary) hypertension: Secondary | ICD-10-CM | POA: Diagnosis not present

## 2017-01-12 DIAGNOSIS — M810 Age-related osteoporosis without current pathological fracture: Secondary | ICD-10-CM | POA: Diagnosis not present

## 2017-01-12 DIAGNOSIS — N390 Urinary tract infection, site not specified: Secondary | ICD-10-CM | POA: Diagnosis not present

## 2017-01-12 DIAGNOSIS — J449 Chronic obstructive pulmonary disease, unspecified: Secondary | ICD-10-CM | POA: Diagnosis not present

## 2017-01-12 DIAGNOSIS — E1142 Type 2 diabetes mellitus with diabetic polyneuropathy: Secondary | ICD-10-CM | POA: Diagnosis not present

## 2017-01-14 DIAGNOSIS — E1142 Type 2 diabetes mellitus with diabetic polyneuropathy: Secondary | ICD-10-CM | POA: Diagnosis not present

## 2017-01-14 DIAGNOSIS — I10 Essential (primary) hypertension: Secondary | ICD-10-CM | POA: Diagnosis not present

## 2017-01-14 DIAGNOSIS — N390 Urinary tract infection, site not specified: Secondary | ICD-10-CM | POA: Diagnosis not present

## 2017-01-14 DIAGNOSIS — A419 Sepsis, unspecified organism: Secondary | ICD-10-CM | POA: Diagnosis not present

## 2017-01-14 DIAGNOSIS — J449 Chronic obstructive pulmonary disease, unspecified: Secondary | ICD-10-CM | POA: Diagnosis not present

## 2017-01-14 DIAGNOSIS — M810 Age-related osteoporosis without current pathological fracture: Secondary | ICD-10-CM | POA: Diagnosis not present

## 2017-01-14 DIAGNOSIS — F17210 Nicotine dependence, cigarettes, uncomplicated: Secondary | ICD-10-CM | POA: Diagnosis not present

## 2017-01-14 DIAGNOSIS — E785 Hyperlipidemia, unspecified: Secondary | ICD-10-CM | POA: Diagnosis not present

## 2017-01-14 DIAGNOSIS — Z452 Encounter for adjustment and management of vascular access device: Secondary | ICD-10-CM | POA: Diagnosis not present

## 2017-01-27 DIAGNOSIS — J069 Acute upper respiratory infection, unspecified: Secondary | ICD-10-CM | POA: Diagnosis not present

## 2017-01-27 DIAGNOSIS — E1165 Type 2 diabetes mellitus with hyperglycemia: Secondary | ICD-10-CM | POA: Diagnosis not present

## 2017-01-27 DIAGNOSIS — F17210 Nicotine dependence, cigarettes, uncomplicated: Secondary | ICD-10-CM | POA: Diagnosis not present

## 2017-01-27 DIAGNOSIS — I10 Essential (primary) hypertension: Secondary | ICD-10-CM | POA: Diagnosis not present

## 2017-01-27 DIAGNOSIS — J441 Chronic obstructive pulmonary disease with (acute) exacerbation: Secondary | ICD-10-CM | POA: Diagnosis not present

## 2017-02-02 ENCOUNTER — Other Ambulatory Visit: Payer: Self-pay | Admitting: Pharmacy Technician

## 2017-02-02 NOTE — Patient Outreach (Signed)
[facility]) Care Management  02/02/2017  Alexandra Yang 1946-02-03 [id number]   Contacted patient in regards to Metformin ER 500mg  medication adherence. Verified HIPAA, Patient states she takes 1 tablet daily without any issues. Patient receives other medications from [facility] order and requested I verify that the Metformin would be free if she received thru Mail Order. I informed patient that I would verify and then contact her back.  [name] [place], [place] Management [phone]

## 2017-02-02 NOTE — Patient Outreach (Signed)
[facility]) Care Management  02/02/2017  LIANETTE BROUSSARD [birth year] [id number]   [facility] to verify patient co-pay thru Mail Order for Metformin ER 500mg . SPoke to Quillian Quince whom stated patient's copay would be $6 for a 3 month supply.  Contacted patient, HIPAA verified. Patient stated that she would prefer to continue receiving script from [facility], however would like for it to be switched to a 3 month script. Will contact provider to request new script for patient.  [name] [place], [place] Management [phone]

## 2017-02-02 NOTE — Patient Outreach (Signed)
[facility]) Care Management  02/02/2017  BRIHANA QUICKEL 1946-03-27 [id number]   Left voicemail on Provider's nurses line requesting that a 3 month script be sent into [facility] for patients Metformin ER 500mg .  [name] [place], [place] Management [phone]

## 2017-02-03 ENCOUNTER — Other Ambulatory Visit: Payer: Self-pay | Admitting: Pharmacy Technician

## 2017-02-03 NOTE — Patient Outreach (Signed)
[facility]) Care Management  02/03/2017  WYNELLE DREIER 09-17-1945 [id number]   Spoke to Ria Comment at Dr. Laurelyn Sickle office and requested script be sent into [facility] Mail order for Glucophage XR 500mg  for a 3 month script. Deesha sent it while I was on the phone with her! Will follow up with mail order.  Called patient and left Voicemail to please return my call.  [name] [place], [place] Management [phone]

## 2017-04-29 ENCOUNTER — Encounter: Payer: Self-pay | Admitting: Nurse Practitioner

## 2017-04-29 ENCOUNTER — Ambulatory Visit: Payer: Self-pay | Admitting: Nurse Practitioner

## 2017-04-29 VITALS — BP 140/70 | HR 66 | Resp 16 | Ht 67.0 in | Wt 139.0 lb

## 2017-04-29 DIAGNOSIS — M25561 Pain in right knee: Secondary | ICD-10-CM | POA: Diagnosis not present

## 2017-04-29 DIAGNOSIS — E1165 Type 2 diabetes mellitus with hyperglycemia: Secondary | ICD-10-CM | POA: Diagnosis not present

## 2017-04-29 DIAGNOSIS — M25541 Pain in joints of right hand: Secondary | ICD-10-CM

## 2017-04-29 DIAGNOSIS — Z23 Encounter for immunization: Secondary | ICD-10-CM

## 2017-04-29 LAB — POCT GLYCOSYLATED HEMOGLOBIN (HGB A1C): Hemoglobin A1C: 9.3

## 2017-04-29 MED ORDER — IBUPROFEN 800 MG PO TABS: 800.0000 mg | ORAL_TABLET | Freq: Three times a day (TID) | ORAL | 1 refills | Status: DC | PRN

## 2017-04-29 NOTE — Progress Notes (Signed)
Subjective:     Patient ID: Alexandra Yang, female   DOB: 09/06/1945, 71 y.o.   MRN: [id number]   1. Elevated blood sugars. Ran out of Victoza. Has been trying to compensate for this by taking Toujeo 25units daily and metformin 500mg  BID. Blood sugars are generally running around 200.  2. Pain in right hand. Did have fracture about 2 years ago, which did have surgical repair. She has noted some swelling and pain in the proximal joints of middle and 4th fingers. Hurts like a tooth ache. Ran out of motrin.  3. Right knee pain. Did have surgery in the past for this in the past. Now having reduced ROM and feels like there is a vice grip around it. Also will feel like it wants to buckle. Has run out of her Motrin and thus, pain is more severe.     Review of Systems  Constitutional: Positive for activity change. Negative for appetite change, chills, diaphoresis, fatigue and fever.  HENT: Positive for congestion and rhinorrhea. Negative for ear pain.   Respiratory: Negative for cough and shortness of breath.   Cardiovascular: Negative for palpitations and leg swelling.  Gastrointestinal: Negative.   Endocrine:       High blood sugars. Out of Victoza.   Musculoskeletal: Positive for arthralgias and joint swelling.  Allergic/Immunologic: Negative.    Today's Vitals   04/29/17 1130  BP: 140/70  Pulse: 66  Resp: 16  SpO2: 97%  Weight: 139 lb (63 kg)  Height: 5\' 7"  (1.702 m)       Objective:   Physical Exam  Constitutional: She is oriented to person, place, and time. She appears well-developed and well-nourished.  HENT:  Head: Normocephalic and atraumatic.  Neck: Normal range of motion. Neck supple.  Cardiovascular: Normal rate and regular rhythm.  Pulmonary/Chest: Breath sounds normal.  Abdominal: Soft. There is no tenderness.  Musculoskeletal:       Right knee: She exhibits decreased range of motion, swelling and bony tenderness. Tenderness found.       Arms: Joint swelling,  proximal joint of midle and fourth fingers. ROM and strength is reduced In the right hand.  Neurological: She is alert and oriented to person, place, and time.  Skin: Skin is warm and dry.  Psychiatric: She has a normal mood and affect. Her behavior is normal.       Assessment:     Encounter Diagnoses  Name Primary?  Marland Kitchen Uncontrolled type 2 diabetes mellitus with hyperglycemia ([place]) Yes  . Pain in joint of right knee   . Pain in joint of right hand   . Flu vaccine need        Plan:     1. Uncontrolled DM2 - HgbA1c 9.3 - restart Victoza at 0.6mg  daily. Incresae, gradually, to 1.8mg  daily. Continue Toujeo and Metformin as before.  2. Pain in right wrist - motrin 800mg  up to TID as needed. Refer to Dr. Amedeo Plenty for further evaluation and treatment 3. Pain in right knee - motrin 800mg  TID as needed. Refer to Reche Dixon, PA for continued evaluation and treatment  4. Flu vaccine administered today.

## 2017-04-29 NOTE — Patient Instructions (Signed)
1. Restart victoza at 0.6mg  daily for one week. Titrate up, gradually, to 1.8mg  daily. Continue toujeo at 25units every day and metformin 500mg  BID 2. Renew ibuprofen 800mg  up to TID as needed for pain/inflammation. Refer back to Dr. Prescott Parma for further evaluation and treatment 3. Pain in hand - motrin 800mg  TID prn - Refer back to Dr. Amedeo Plenty for further evaluatio nand treatment

## 2017-05-20 DIAGNOSIS — M25561 Pain in right knee: Secondary | ICD-10-CM | POA: Diagnosis not present

## 2017-05-20 DIAGNOSIS — M222X1 Patellofemoral disorders, right knee: Secondary | ICD-10-CM | POA: Diagnosis not present

## 2017-05-20 DIAGNOSIS — M1711 Unilateral primary osteoarthritis, right knee: Secondary | ICD-10-CM | POA: Diagnosis not present

## 2017-05-20 DIAGNOSIS — E118 Type 2 diabetes mellitus with unspecified complications: Secondary | ICD-10-CM | POA: Diagnosis not present

## 2017-06-10 DIAGNOSIS — E119 Type 2 diabetes mellitus without complications: Secondary | ICD-10-CM | POA: Diagnosis not present

## 2017-06-10 DIAGNOSIS — H2513 Age-related nuclear cataract, bilateral: Secondary | ICD-10-CM | POA: Diagnosis not present

## 2017-07-06 ENCOUNTER — Telehealth: Payer: Self-pay

## 2017-07-06 ENCOUNTER — Other Ambulatory Visit: Payer: Self-pay | Admitting: Nurse Practitioner

## 2017-07-06 DIAGNOSIS — N3000 Acute cystitis without hematuria: Secondary | ICD-10-CM

## 2017-07-06 MED ORDER — SULFAMETHOXAZOLE-TRIMETHOPRIM 800-160 MG PO TABS: 1.0000 | ORAL_TABLET | Freq: Two times a day (BID) | ORAL | 0 refills | Status: DC

## 2017-07-06 NOTE — Telephone Encounter (Signed)
-----   Message from Ronnell Freshwater, NP sent at 07/06/2017  1:43 PM EST ----- Regarding: RE: sick, might have UTI? Added bactrim DS twice daily for 10 days. Drink watera nd cranberry juice. Tylenol for fever and aches.   ----- Message ----- From: Waynard Edwards Sent: 07/06/2017  12:16 PM To: Ronnell Freshwater, NP Subject: sick, might have UTI?                          Pt called saying she has chills, back aches, and it burns when she urinates. She mentioned she has a history of ESVL (I believe this is what she said), so she thinks it may be that or an UTI. Pt also said she has not eaten today.

## 2017-07-06 NOTE — Progress Notes (Unsigned)
Added bactrim DS bid for 10 days. Use tylenol prn for fever anc aches.

## 2017-07-06 NOTE — Telephone Encounter (Signed)
Patient was informed of this

## 2017-07-21 ENCOUNTER — Ambulatory Visit (INDEPENDENT_AMBULATORY_CARE_PROVIDER_SITE_OTHER): Payer: Medicare HMO | Admitting: Internal Medicine

## 2017-07-21 ENCOUNTER — Encounter: Payer: Self-pay | Admitting: Internal Medicine

## 2017-07-21 VITALS — BP 140/80 | HR 67 | Resp 16 | Ht 67.0 in | Wt 137.2 lb

## 2017-07-21 DIAGNOSIS — E1165 Type 2 diabetes mellitus with hyperglycemia: Secondary | ICD-10-CM | POA: Diagnosis not present

## 2017-07-21 DIAGNOSIS — Z1231 Encounter for screening mammogram for malignant neoplasm of breast: Secondary | ICD-10-CM | POA: Diagnosis not present

## 2017-07-21 DIAGNOSIS — Z1239 Encounter for other screening for malignant neoplasm of breast: Secondary | ICD-10-CM

## 2017-07-21 DIAGNOSIS — N3000 Acute cystitis without hematuria: Secondary | ICD-10-CM | POA: Diagnosis not present

## 2017-07-21 DIAGNOSIS — R300 Dysuria: Secondary | ICD-10-CM

## 2017-07-21 LAB — POCT URINALYSIS DIPSTICK
Bilirubin, UA: NEGATIVE
Glucose, UA: NEGATIVE
KETONES UA: NEGATIVE
NITRITE UA: POSITIVE
Spec Grav, UA: 1.02 (ref 1.010–1.025)
UROBILINOGEN UA: 0.2 U/dL
pH, UA: 5 (ref 5.0–8.0)

## 2017-07-21 LAB — POCT GLYCOSYLATED HEMOGLOBIN (HGB A1C): Hemoglobin A1C: 8.5

## 2017-07-21 MED ORDER — NITROFURANTOIN MONOHYD MACRO 100 MG PO CAPS: 100.0000 mg | ORAL_CAPSULE | Freq: Two times a day (BID) | ORAL | 1 refills | Status: DC

## 2017-07-21 MED ORDER — SULFAMETHOXAZOLE-TRIMETHOPRIM 800-160 MG PO TABS: ORAL_TABLET | ORAL | 0 refills | Status: DC

## 2017-07-21 MED ORDER — INSULIN GLARGINE 300 UNIT/ML ~~LOC~~ SOPN: 24.0000 [IU] | PEN_INJECTOR | SUBCUTANEOUS | 12 refills | Status: DC

## 2017-07-21 NOTE — Progress Notes (Signed)
[facility] [place], [place] [address]  Internal MEDICINE  Office Visit Note  Patient Name: Alexandra Yang  [id number]  [id number]  Date of Service: 07/21/2017  Chief Complaint  Patient presents with  . Urinary Tract Infection  . Diabetes    HPI  Pt is here for routine follow up.    Current Medication: Outpatient Encounter Medications as of 07/21/2017  Medication Sig  . albuterol (PROVENTIL) (2.5 MG/3ML) 0.083% nebulizer solution Take 2.5 mg by nebulization every 6 (six) hours as needed for wheezing or shortness of breath.  Marland Kitchen aspirin 81 MG chewable tablet Chew 81 mg by mouth every evening.   . cholecalciferol (VITAMIN D) 1000 UNITS tablet Take 1,000 Units by mouth daily.  . ertapenem 1 g in sodium chloride 0.9 % 50 mL Inject 1 g into the vein daily.  Marland Kitchen ibuprofen (ADVIL,MOTRIN) 800 MG tablet Take 1 tablet (800 mg total) by mouth 3 (three) times daily as needed.  . Insulin Glargine (TOUJEO MAX SOLOSTAR) 300 UNIT/ML SOPN Inject 24 Units into the skin 1 day or 1 dose for 1 dose.  Marland Kitchen lisinopril (PRINIVIL,ZESTRIL) 5 MG tablet Take 5 mg by mouth daily.   . metFORMIN (GLUCOPHAGE-XR) 500 MG 24 hr tablet Take 500 mg by mouth daily.  . metoCLOPramide (REGLAN) 10 MG tablet Take 10 mg by mouth daily as needed.   . Multiple Vitamins-Minerals (CENTRUM SILVER PO) Take 1 tablet by mouth daily.  . nitrofurantoin, macrocrystal-monohydrate, (MACROBID) 100 MG capsule Take 1 capsule (100 mg total) by mouth 2 (two) times daily.  . ondansetron (ZOFRAN) 4 MG tablet Take 1 tablet (4 mg total) by mouth every 8 (eight) hours as needed for nausea or vomiting.  . pregabalin (LYRICA) 75 MG capsule Take 75 mg by mouth at bedtime as needed (pain).   . simvastatin (ZOCOR) 40 MG tablet Take 40 mg by mouth at bedtime.  . sulfamethoxazole-trimethoprim (BACTRIM DS,SEPTRA DS) 800-160 MG tablet One tab a day on m/w/f  . VICTOZA 18 MG/3ML SOPN Inject 1.8 Units into the skin every morning.  .  vitamin B-12 (CYANOCOBALAMIN) 1000 MCG tablet Take 1,000 mcg by mouth daily.   . [DISCONTINUED] insulin glargine (LANTUS) 100 UNIT/ML injection Inject 0.12 mLs (12 Units total) into the skin every evening.  . [DISCONTINUED] sulfamethoxazole-trimethoprim (BACTRIM DS,SEPTRA DS) 800-160 MG tablet Take 1 tablet by mouth 2 (two) times daily.   No facility-administered encounter medications on file as of 07/21/2017.     Surgical History: Past Surgical History:  Procedure Laterality Date  . ABDOMINAL HYSTERECTOMY    . CHOLECYSTECTOMY    . KNEE ARTHROSCOPY Right   . TENDON TRANSFER Right 01/20/2015   Procedure: RIGHT HAND AND MIDDLE FINGER TENDON RECONSTRUCTION AND TRANSFER;  Surgeon: Roseanne Kaufman, MD;  Location: [facility];  Service: Orthopedics;  Laterality: Right;    Medical History: Past Medical History:  Diagnosis Date  . Arthritis    osteoarthritis  . Cancer ([place]) 1995   endometrial ca  . COPD (chronic obstructive pulmonary disease) ([place])   . Diabetes mellitus without complication ([place])    type 2  . GERD (gastroesophageal reflux disease)   . History of kidney infection    had to have PICC line  . Neuropathy    both feet  . Osteopenia   . Osteoporosis   . Pneumonia     Family History: Family History  Problem Relation Age of Onset  . Pancreatic cancer Mother   . Cancer Mother  pancreaitis  . Lung cancer Father   . Breast cancer Sister 87    Social History   Socioeconomic History  . Marital status: Widowed    Spouse name: Not on file  . Number of children: Not on file  . Years of education: Not on file  . Highest education level: Not on file  Social Needs  . Financial resource strain: Not on file  . Food insecurity - worry: Not on file  . Food insecurity - inability: Not on file  . Transportation needs - medical: Not on file  . Transportation needs - non-medical: Not on file  Occupational History  . Not on file  Tobacco Use  . Smoking status: Current Every  Day Smoker    Packs/day: 0.50    Years: 40.00    Pack years: 20.00    Types: Cigarettes  . Smokeless tobacco: Never Used  Substance and Sexual Activity  . Alcohol use: Yes    Comment: occasional  . Drug use: No  . Sexual activity: Not on file  Other Topics Concern  . Not on file  Social History Narrative  . Not on file    Review of Systems  Constitutional: Positive for chills and diaphoresis.  HENT: Negative for congestion, rhinorrhea, sneezing and sore throat.   Eyes: Negative for redness.  Respiratory: Negative for cough, chest tightness and shortness of breath.   Cardiovascular: Negative for chest pain and palpitations.  Gastrointestinal: Negative for abdominal pain, constipation, diarrhea, nausea and vomiting.  Genitourinary: Negative for dysuria and frequency.  Musculoskeletal: Negative for arthralgias, back pain, joint swelling and neck pain.  Skin: Negative for rash.  Neurological: Positive for numbness.  Hematological: Negative for adenopathy. Does not bruise/bleed easily.  Psychiatric/Behavioral: Negative for behavioral problems (Depression), sleep disturbance and suicidal ideas. The patient is not nervous/anxious.     Vital Signs: BP 140/80   Pulse 67   Resp 16   Ht 5\' 7"  (1.702 m)   Wt 137 lb 3.2 oz (62.2 kg)   SpO2 98%   BMI 21.49 kg/m    Physical Exam  Constitutional: She is oriented to person, place, and time. She appears well-developed and well-nourished. No distress.  HENT:  Head: Normocephalic and atraumatic.  Mouth/Throat: Oropharynx is clear and moist. No oropharyngeal exudate.  Eyes: EOM are normal. Pupils are equal, round, and reactive to light.  Neck: Normal range of motion. Neck supple.  Cardiovascular: Normal rate, regular rhythm and normal heart sounds. Exam reveals no gallop.  Pulmonary/Chest: Effort normal.  Abdominal: Soft.  Neurological: She is alert and oriented to person, place, and time. No cranial nerve deficit.  Skin: Skin is  warm and dry. She is not diaphoretic.  Psychiatric: She has a normal mood and affect.    Assessment/Plan: 1. Uncontrolled type 2 diabetes mellitus with hyperglycemia (HCC) - POCT HgB A1C - Insulin Glargine (TOUJEO MAX SOLOSTAR) 300 UNIT/ML SOPN; Inject 24 Units into the skin 1 day or 1 dose for 1 dose.  Dispense: 4 pen; Refill: 12  2. Dysuria - POCT Urinalysis Dipstick  3. Acute cystitis without hematuria -Will start chronic suppressive therapy, may change treatment after c/c available  - nitrofurantoin, macrocrystal-monohydrate, (MACROBID) 100 MG capsule; Take 1 capsule (100 mg total) by mouth 2 (two) times daily.  Dispense: 60 capsule; Refill: 1 - sulfamethoxazole-trimethoprim (BACTRIM DS,SEPTRA DS) 800-160 MG tablet; One tab a day on m/w/f  Dispense: 45 tablet; Refill: 0 - CULTURE, URINE COMPREHENSIVE  4. Breast cancer screening - MM DIGITAL  SCREENING BILATERAL; Future  General Counseling: mishell donalson understanding of the findings of todays visit and agrees with plan of treatment. I have discussed any further diagnostic evaluation that may be needed or ordered today. We also reviewed her medications today. she has been encouraged to call the office with any questions or concerns that should arise related to todays visit.    Counseling:    Orders Placed This Encounter  Procedures  . CULTURE, URINE COMPREHENSIVE  . MM DIGITAL SCREENING BILATERAL  . POCT HgB A1C  . POCT Urinalysis Dipstick    Meds ordered this encounter  Medications  . Insulin Glargine (TOUJEO MAX SOLOSTAR) 300 UNIT/ML SOPN    Sig: Inject 24 Units into the skin 1 day or 1 dose for 1 dose.    Dispense:  4 pen    Refill:  12  . nitrofurantoin, macrocrystal-monohydrate, (MACROBID) 100 MG capsule    Sig: Take 1 capsule (100 mg total) by mouth 2 (two) times daily.    Dispense:  60 capsule    Refill:  1  . sulfamethoxazole-trimethoprim (BACTRIM DS,SEPTRA DS) 800-160 MG tablet    Sig: One tab a day on  m/w/f    Dispense:  45 tablet    Refill:  0    Time spent 25 Minutes   Dr Lavera Guise Internal medicine

## 2017-07-24 LAB — CULTURE, URINE COMPREHENSIVE

## 2017-07-28 ENCOUNTER — Telehealth: Payer: Self-pay

## 2017-07-28 NOTE — Telephone Encounter (Signed)
Left vm that lab still showing ecoli and that the abx that was given would be ok for the infection and to finish the abx.  dbs

## 2017-07-29 ENCOUNTER — Other Ambulatory Visit: Payer: Self-pay

## 2017-07-29 MED ORDER — VICTOZA 18 MG/3ML ~~LOC~~ SOPN: PEN_INJECTOR | SUBCUTANEOUS | 6 refills | Status: DC

## 2017-08-12 ENCOUNTER — Other Ambulatory Visit: Payer: Self-pay

## 2017-08-12 MED ORDER — SIMVASTATIN 40 MG PO TABS: 40.0000 mg | ORAL_TABLET | Freq: Every day | ORAL | 3 refills | Status: DC

## 2017-08-12 MED ORDER — INSULIN PEN NEEDLE 32G X 4 MM MISC: 3 refills | Status: AC

## 2017-08-13 ENCOUNTER — Other Ambulatory Visit: Payer: Self-pay

## 2017-08-13 MED ORDER — METOCLOPRAMIDE HCL 10 MG PO TABS: 10.0000 mg | ORAL_TABLET | Freq: Every day | ORAL | 1 refills | Status: DC | PRN

## 2017-08-20 ENCOUNTER — Other Ambulatory Visit: Payer: Self-pay | Admitting: Nurse Practitioner

## 2017-08-20 ENCOUNTER — Telehealth: Payer: Self-pay | Admitting: Nurse Practitioner

## 2017-08-20 DIAGNOSIS — J069 Acute upper respiratory infection, unspecified: Secondary | ICD-10-CM

## 2017-08-20 DIAGNOSIS — R059 Cough, unspecified: Secondary | ICD-10-CM

## 2017-08-20 DIAGNOSIS — R05 Cough: Secondary | ICD-10-CM

## 2017-08-20 MED ORDER — SULFAMETHOXAZOLE-TRIMETHOPRIM 800-160 MG PO TABS: 1.0000 | ORAL_TABLET | Freq: Two times a day (BID) | ORAL | 0 refills | Status: DC

## 2017-08-20 MED ORDER — BENZONATATE 200 MG PO CAPS: 200.0000 mg | ORAL_CAPSULE | Freq: Three times a day (TID) | ORAL | 0 refills | Status: DC | PRN

## 2017-08-20 NOTE — Telephone Encounter (Signed)
Bactrim BID for 10 days and tessalon perls 200mg  TID prn cough sent to her pharmacy.

## 2017-08-20 NOTE — Progress Notes (Signed)
Bactrim BID for 10 days and tessalon perls 200mg  TID prn cough sent to her pharmacy.

## 2017-08-21 NOTE — Telephone Encounter (Signed)
Patient was advised that heather called in bactrim bid x10 days and tessalon pearle 200mg  tid prn cough to her pharmcy; she was also provided a work note for 08/20/17 & 08/21/17/[employer]

## 2017-08-23 ENCOUNTER — Other Ambulatory Visit: Payer: Self-pay

## 2017-08-23 ENCOUNTER — Encounter: Payer: Self-pay | Admitting: *Deleted

## 2017-08-23 ENCOUNTER — Emergency Department
Admission: EM | Admit: 2017-08-23 | Discharge: 2017-08-23 | Disposition: A | Discharge status: Home / Self Care | Payer: Medicare HMO | Attending: Emergency Medicine | Admitting: Emergency Medicine

## 2017-08-23 ENCOUNTER — Emergency Department: Payer: Medicare HMO

## 2017-08-23 DIAGNOSIS — R21 Rash and other nonspecific skin eruption: Secondary | ICD-10-CM | POA: Diagnosis not present

## 2017-08-23 DIAGNOSIS — I10 Essential (primary) hypertension: Secondary | ICD-10-CM | POA: Diagnosis not present

## 2017-08-23 DIAGNOSIS — F17210 Nicotine dependence, cigarettes, uncomplicated: Secondary | ICD-10-CM | POA: Insufficient documentation

## 2017-08-23 DIAGNOSIS — Z79899 Other long term (current) drug therapy: Secondary | ICD-10-CM | POA: Diagnosis not present

## 2017-08-23 DIAGNOSIS — Z8542 Personal history of malignant neoplasm of other parts of uterus: Secondary | ICD-10-CM | POA: Diagnosis not present

## 2017-08-23 DIAGNOSIS — Z7982 Long term (current) use of aspirin: Secondary | ICD-10-CM | POA: Insufficient documentation

## 2017-08-23 DIAGNOSIS — J449 Chronic obstructive pulmonary disease, unspecified: Secondary | ICD-10-CM | POA: Insufficient documentation

## 2017-08-23 DIAGNOSIS — L509 Urticaria, unspecified: Secondary | ICD-10-CM | POA: Diagnosis not present

## 2017-08-23 DIAGNOSIS — E119 Type 2 diabetes mellitus without complications: Secondary | ICD-10-CM | POA: Diagnosis not present

## 2017-08-23 DIAGNOSIS — Z7984 Long term (current) use of oral hypoglycemic drugs: Secondary | ICD-10-CM | POA: Insufficient documentation

## 2017-08-23 DIAGNOSIS — J069 Acute upper respiratory infection, unspecified: Secondary | ICD-10-CM | POA: Diagnosis not present

## 2017-08-23 DIAGNOSIS — R05 Cough: Secondary | ICD-10-CM | POA: Diagnosis not present

## 2017-08-23 LAB — BASIC METABOLIC PANEL
Anion gap: 7 (ref 5–15)
BUN: 27 mg/dL — AB (ref 6–20)
CALCIUM: 9.7 mg/dL (ref 8.9–10.3)
CO2: 23 mmol/L (ref 22–32)
CREATININE: 0.87 mg/dL (ref 0.44–1.00)
Chloride: 105 mmol/L (ref 101–111)
GFR calc Af Amer: >60 mL/min (ref >60)
GFR calc non Af Amer: >60 mL/min (ref >60)
GLUCOSE: 221 mg/dL — AB (ref 65–99)
Potassium: 4.3 mmol/L (ref 3.5–5.1)
Sodium: 135 mmol/L (ref 135–145)

## 2017-08-23 LAB — CBC WITH DIFFERENTIAL/PLATELET
BASOS ABS: 0.1 10*3/uL (ref 0–0.1)
Basophils Relative: 1 %
EOS PCT: 1 %
Eosinophils Absolute: 0.1 10*3/uL (ref 0–0.7)
HCT: 42.6 % (ref 35.0–47.0)
Hemoglobin: 14.1 g/dL (ref 12.0–16.0)
LYMPHS PCT: 14 %
Lymphs Abs: 1.5 10*3/uL (ref 1.0–3.6)
MCH: 30.8 pg (ref 26.0–34.0)
MCHC: 33.1 g/dL (ref 32.0–36.0)
MCV: 93 fL (ref 80.0–100.0)
MONO ABS: 0.5 10*3/uL (ref 0.2–0.9)
MONOS PCT: 4 %
Neutro Abs: 9.3 10*3/uL — ABNORMAL HIGH (ref 1.4–6.5)
Neutrophils Relative %: 80 %
Platelets: 272 10*3/uL (ref 150–440)
RBC: 4.58 MIL/uL (ref 3.80–5.20)
RDW: 13.8 % (ref 11.5–14.5)
WBC: 11.5 10*3/uL — ABNORMAL HIGH (ref 3.6–11.0)

## 2017-08-23 MED ORDER — HYDROXYZINE HCL 10 MG PO TABS: 10.0000 mg | ORAL_TABLET | Freq: Three times a day (TID) | ORAL | 0 refills | Status: DC | PRN

## 2017-08-23 MED ORDER — ACETAMINOPHEN 500 MG PO TABS
1000.0000 mg | ORAL_TABLET | Freq: Once | ORAL | Status: AC
  Administered 2017-08-23: 1000 mg via ORAL
  Filled 2017-08-23: qty 2

## 2017-08-23 MED ORDER — PREDNISONE 20 MG PO TABS
40.0000 mg | ORAL_TABLET | Freq: Once | ORAL | Status: AC
  Administered 2017-08-23: 40 mg via ORAL
  Filled 2017-08-23: qty 2

## 2017-08-23 MED ORDER — PREDNISONE 20 MG PO TABS: 40.0000 mg | ORAL_TABLET | Freq: Every day | ORAL | 0 refills | Status: AC

## 2017-08-23 MED ORDER — HYDROXYZINE HCL 25 MG PO TABS
25.0000 mg | ORAL_TABLET | Freq: Once | ORAL | Status: AC
Start: 2017-08-23 — End: 2017-08-23
  Administered 2017-08-23: 25 mg via ORAL
  Filled 2017-08-23 (×2): qty 1

## 2017-08-23 NOTE — ED Notes (Signed)
Patient transported to X-ray 

## 2017-08-23 NOTE — ED Notes (Signed)
ED Provider at bedside. 

## 2017-08-23 NOTE — ED Triage Notes (Signed)
Patient had a rash that began today that covers torso and arms. Patient c/o pruritis. Patient is on Septra and [employer] on 4/4.

## 2017-08-23 NOTE — ED Provider Notes (Signed)
[facility] Emergency Department Provider Note   ____________________________________________   I have reviewed the triage vital signs and the nursing notes.   HISTORY  Chief Complaint Allergic Reaction   History limited by: Not Limited   HPI Alexandra Yang is a 72 y.o. female who presents to the emergency department today with multiple complaints with primary complaint being for a rash.  The rash started today.  Initially located on her chest however it is now spread throughout her body.  Consistence of pruritic raised red lesions.  Patient denies similar rash in the past.  She was recently put on Septra and Tessalon Perles for presumed pneumonia.  The patient has been having cough and chills.  She also is complaining of sweats.  Patient denies any new detergents or laundry.  No unusual ingestions.  No recent travel.   Per medical record review patient has a history of DM  Past Medical History:  Diagnosis Date  . Arthritis    osteoarthritis  . Cancer ([place]) 1995   endometrial ca  . COPD (chronic obstructive pulmonary disease) ([place])   . Diabetes mellitus without complication ([place])    type 2  . GERD (gastroesophageal reflux disease)   . History of kidney infection    had to have PICC line  . Neuropathy    both feet  . Osteopenia   . Osteoporosis   . Pneumonia     Patient Active Problem List   Diagnosis Date Noted  . Sepsis ([place]) 12/19/2016  . Primary osteoarthritis of right hand 06/13/2014  . Inflammation of joint of right knee 11/10/2013  . Diabetes mellitus ([place]) 09/25/2011  . Hypertension 09/25/2011    Past Surgical History:  Procedure Laterality Date  . ABDOMINAL HYSTERECTOMY    . CHOLECYSTECTOMY    . KNEE ARTHROSCOPY Right   . TENDON TRANSFER Right 01/20/2015   Procedure: RIGHT HAND AND MIDDLE FINGER TENDON RECONSTRUCTION AND TRANSFER;  Surgeon: Roseanne Kaufman, MD;  Location: [facility];  Service: Orthopedics;  Laterality: Right;     Prior to Admission medications   Medication Sig Start Date End Date Taking? Authorizing Provider  albuterol (PROVENTIL) (2.5 MG/3ML) 0.083% nebulizer solution Take 2.5 mg by nebulization every 6 (six) hours as needed for wheezing or shortness of breath.    [provider]  aspirin 81 MG chewable tablet Chew 81 mg by mouth every evening.     [provider]  benzonatate (TESSALON) 200 MG capsule Take 1 capsule (200 mg total) by mouth 3 (three) times daily as needed for cough. 08/20/17   Ronnell Freshwater, NP  cholecalciferol (VITAMIN D) 1000 UNITS tablet Take 1,000 Units by mouth daily.    [provider]  ertapenem 1 g in sodium chloride 0.9 % 50 mL Inject 1 g into the vein daily. 12/21/16   Epifanio Lesches, MD  ibuprofen (ADVIL,MOTRIN) 800 MG tablet Take 1 tablet (800 mg total) by mouth 3 (three) times daily as needed. 04/29/17   Ronnell Freshwater, NP  Insulin Glargine (TOUJEO MAX SOLOSTAR) 300 UNIT/ML SOPN Inject 24 Units into the skin 1 day or 1 dose for 1 dose. 07/21/17 07/22/17  Lavera Guise, MD  Insulin Pen Needle 32G X 4 MM MISC USE AS ONCE DAILY 08/12/17   Lavera Guise, MD  lisinopril (PRINIVIL,ZESTRIL) 5 MG tablet Take 5 mg by mouth daily.     [provider]  metFORMIN (GLUCOPHAGE-XR) 500 MG 24 hr tablet Take 500 mg by mouth daily. 12/01/16  [provider]  metoCLOPramide (REGLAN) 10 MG tablet Take 1 tablet (10 mg total) by mouth daily as needed. 08/13/17   Ronnell Freshwater, NP  Multiple Vitamins-Minerals (CENTRUM SILVER PO) Take 1 tablet by mouth daily.    [provider]  nitrofurantoin, macrocrystal-monohydrate, (MACROBID) 100 MG capsule Take 1 capsule (100 mg total) by mouth 2 (two) times daily. 07/21/17   Lavera Guise, MD  ondansetron (ZOFRAN) 4 MG tablet Take 1 tablet (4 mg total) by mouth every 8 (eight) hours as needed for nausea or vomiting. 12/14/16   Nance Pear, MD  pregabalin (LYRICA) 75 MG capsule Take 75 mg by  mouth at bedtime as needed (pain).     [provider]  simvastatin (ZOCOR) 40 MG tablet Take 1 tablet (40 mg total) by mouth at bedtime. 08/12/17   Lavera Guise, MD  sulfamethoxazole-trimethoprim (BACTRIM DS,SEPTRA DS) 800-160 MG tablet Take 1 tablet by mouth 2 (two) times daily. 08/20/17   Ronnell Freshwater, NP  VICTOZA 18 MG/3ML SOPN Inject 1.8 mg  daily 07/29/17   Lavera Guise, MD  vitamin B-12 (CYANOCOBALAMIN) 1000 MCG tablet Take 1,000 mcg by mouth daily.     [provider]    Allergies Hydrocodone-guaifenesin; Percodan [oxycodone-aspirin]; Ciprofloxacin; Naproxen; and Ultram [tramadol]  Family History  Problem Relation Age of Onset  . Pancreatic cancer Mother   . Cancer Mother        pancreaitis  . Lung cancer Father   . Breast cancer Sister 42    Social History Social History   Tobacco Use  . Smoking status: Current Every Day Smoker    Packs/day: 0.50    Years: 40.00    Pack years: 20.00    Types: Cigarettes  . Smokeless tobacco: Never Used  Substance Use Topics  . Alcohol use: Yes    Comment: occasional  . Drug use: No    Review of Systems Constitutional: Positive for chills Eyes: No visual changes. ENT: No sore throat. Cardiovascular: Denies chest pain. Respiratory: Positive for cough Gastrointestinal: No abdominal pain.  No nausea, no vomiting.  No diarrhea.   Genitourinary: Negative for dysuria. Musculoskeletal: Negative for back pain. Skin: Positive for rash Neurological: Negative for headaches, focal weakness or numbness.  ____________________________________________   PHYSICAL EXAM:  VITAL SIGNS: ED Triage Vitals  Enc Vitals Group     BP 08/23/17 1405 123/73     Pulse Rate 08/23/17 1405 (!) 108     Resp 08/23/17 1405 18     Temp 08/23/17 1405 98.4 F (36.9 C)     Temp Source 08/23/17 1405 Oral     SpO2 08/23/17 1405 99 %     Weight 08/23/17 1406 138 lb (62.6 kg)     Height 08/23/17 1406 5\' 7"  (1.702 m)     Head  Circumference --      Peak Flow --      Pain Score 08/23/17 1413 0   Constitutional: Alert and oriented. Well appearing and in no distress. Eyes: Conjunctivae are normal.  ENT   Head: Normocephalic and atraumatic.   Nose: No congestion/rhinnorhea.   Mouth/Throat: Mucous membranes are moist.   Neck: No stridor. Hematological/Lymphatic/Immunilogical: No cervical lymphadenopathy. Cardiovascular: Normal rate, regular rhythm.  No murmurs, rubs, or gallops.  Respiratory: Normal respiratory effort without tachypnea nor retractions. Breath sounds are clear and equal bilaterally. No wheezes/rales/rhonchi. Gastrointestinal: Soft and non tender. No rebound. No guarding.  Genitourinary: Deferred Musculoskeletal: Normal range of motion in all extremities. No  lower extremity edema. Neurologic:  Normal speech and language. No gross focal neurologic deficits are appreciated.  Skin:  Diffuse urticarial rash noted over trunk and extremities.  Psychiatric: Mood and affect are normal. Speech and behavior are normal. Patient exhibits appropriate insight and judgment.  ____________________________________________    LABS (pertinent positives/negatives)  CBC wbc 11.5, hgb 14.1, plt 272 BMP glu 221, cr 0.87  ____________________________________________   EKG  None  ____________________________________________    RADIOLOGY  CXR No acute disease  ____________________________________________   PROCEDURES  Procedures  ____________________________________________   INITIAL IMPRESSION / ASSESSMENT AND PLAN / ED COURSE  Pertinent labs & imaging results that were available during my care of the patient were reviewed by me and considered in my medical decision making (see chart for details).  Presented to the emergency department today with multiple complaints of her primary was for an urticarial rash.  This rash is of unclear etiology.  She did feel better after Ativan and  steroids.  Will prescribe further Ativan and steroids.  In addition she was recently put on Bactrim and Tessalon Perles for presumed pneumonia.  X-ray does not show pneumonia.  Patient asked if she was going to get a different antibiotic however I discussed with the patient that this point I think likely viral URI and antibiotics would be of no help and could exacerbate reaction.  She then asked about antibiotics for possible sinusitis again I explained patient that given that the sinusitis is not been going on for longer than 10 days statistically would be most likely viral and antibiotics again would not be of benefit. Patient did have urine sample but did not complain of any new urinary symptoms. I offered to send it for a sample however patient declined.  ____________________________________________   FINAL CLINICAL IMPRESSION(S) / ED DIAGNOSES  Final diagnoses:  Urticaria  Viral URI     Note: This dictation was prepared with Dragon dictation. Any transcriptional errors that result from this process are unintentional     Nance Pear, MD 08/23/17 1625

## 2017-08-23 NOTE — Discharge Instructions (Addendum)
Please seek medical attention for any high fevers, chest pain, shortness of breath, change in behavior, persistent vomiting, bloody stool or any other new or concerning symptoms.  

## 2017-08-25 ENCOUNTER — Ambulatory Visit (INDEPENDENT_AMBULATORY_CARE_PROVIDER_SITE_OTHER): Payer: Medicare HMO | Admitting: Internal Medicine

## 2017-08-25 ENCOUNTER — Encounter: Payer: Self-pay | Admitting: Internal Medicine

## 2017-08-25 VITALS — BP 146/84 | HR 97 | Resp 16 | Ht 67.0 in | Wt 137.2 lb

## 2017-08-25 DIAGNOSIS — E1165 Type 2 diabetes mellitus with hyperglycemia: Secondary | ICD-10-CM | POA: Diagnosis not present

## 2017-08-25 DIAGNOSIS — J449 Chronic obstructive pulmonary disease, unspecified: Secondary | ICD-10-CM | POA: Diagnosis not present

## 2017-08-25 DIAGNOSIS — N3000 Acute cystitis without hematuria: Secondary | ICD-10-CM | POA: Diagnosis not present

## 2017-08-25 DIAGNOSIS — J4489 Other specified chronic obstructive pulmonary disease: Secondary | ICD-10-CM

## 2017-08-25 MED ORDER — LEVOFLOXACIN 500 MG PO TABS: 500.0000 mg | ORAL_TABLET | Freq: Every day | ORAL | 0 refills | Status: DC

## 2017-08-25 NOTE — Progress Notes (Signed)
[facility] [place], [place] [address]  Internal MEDICINE  Office Visit Note  Patient Name: Alexandra Yang  [id number]  [id number]  Date of Service: 09/21/2017  Chief Complaint  Patient presents with  . Rash    comes and goes, all over body feels like she is on fire, cold chills, night sweats, itching  . Recurrent Sinusitis    green mucusWBC and creatine was elevated at hospital  . Peripheral Neuropathy    needs medication for foot neuropathy    HPI  Pt was in the hospital for COPD/chronic bronchitis. She was discharged home on Bactrim, she started having hives fever and chills. Medicine has been stopped. Continues to have green sputum and cough, continues to smoke too. She is also having elevated blood sugars    Current Medication: Outpatient Encounter Medications as of 08/25/2017  Medication Sig Note  . albuterol (PROVENTIL) (2.5 MG/3ML) 0.083% nebulizer solution Take 2.5 mg by nebulization every 6 (six) hours as needed for wheezing or shortness of breath.   Marland Kitchen aspirin 81 MG chewable tablet Chew 81 mg by mouth every evening.    . benzonatate (TESSALON) 200 MG capsule Take 1 capsule (200 mg total) by mouth 3 (three) times daily as needed for cough.   . cholecalciferol (VITAMIN D) 1000 UNITS tablet Take 1,000 Units by mouth daily.   . ertapenem 1 g in sodium chloride 0.9 % 50 mL Inject 1 g into the vein daily.   . hydrOXYzine (ATARAX/VISTARIL) 10 MG tablet Take 1 tablet (10 mg total) by mouth 3 (three) times daily as needed for itching.   Marland Kitchen ibuprofen (ADVIL,MOTRIN) 800 MG tablet Take 1 tablet (800 mg total) by mouth 3 (three) times daily as needed.   . Insulin Glargine (TOUJEO MAX SOLOSTAR) 300 UNIT/ML SOPN Inject 24 Units into the skin 1 day or 1 dose for 1 dose.   . Insulin Pen Needle 32G X 4 MM MISC USE AS ONCE DAILY   . levofloxacin (LEVAQUIN) 500 MG tablet Take 1 tablet (500 mg total) by mouth daily.   Marland Kitchen lisinopril (PRINIVIL,ZESTRIL) 5 MG tablet Take 5  mg by mouth daily.    . metFORMIN (GLUCOPHAGE-XR) 500 MG 24 hr tablet Take 500 mg by mouth daily.   . metoCLOPramide (REGLAN) 10 MG tablet Take 1 tablet (10 mg total) by mouth daily as needed.   . Multiple Vitamins-Minerals (CENTRUM SILVER PO) Take 1 tablet by mouth daily.   . nitrofurantoin, macrocrystal-monohydrate, (MACROBID) 100 MG capsule Take 1 capsule (100 mg total) by mouth 2 (two) times daily.   . ondansetron (ZOFRAN) 4 MG tablet Take 1 tablet (4 mg total) by mouth every 8 (eight) hours as needed for nausea or vomiting.   . [EXPIRED] predniSONE (DELTASONE) 20 MG tablet Take 2 tablets (40 mg total) by mouth daily for 4 days.   . pregabalin (LYRICA) 75 MG capsule Take 75 mg by mouth at bedtime as needed (pain).    . simvastatin (ZOCOR) 40 MG tablet Take 1 tablet (40 mg total) by mouth at bedtime.   Marland Kitchen VICTOZA 18 MG/3ML SOPN Inject 1.8 mg  daily   . vitamin B-12 (CYANOCOBALAMIN) 1000 MCG tablet Take 1,000 mcg by mouth daily.    . [DISCONTINUED] sulfamethoxazole-trimethoprim (BACTRIM DS,SEPTRA DS) 800-160 MG tablet Take 1 tablet by mouth 2 (two) times daily. 08/25/2017: hives    No facility-administered encounter medications on file as of 08/25/2017.     Surgical History: Past Surgical History:  Procedure Laterality  Date  . ABDOMINAL HYSTERECTOMY    . CHOLECYSTECTOMY    . KNEE ARTHROSCOPY Right   . TENDON TRANSFER Right 01/20/2015   Procedure: RIGHT HAND AND MIDDLE FINGER TENDON RECONSTRUCTION AND TRANSFER;  Surgeon: Roseanne Kaufman, MD;  Location: [place];  Service: Orthopedics;  Laterality: Right;    Medical History: Past Medical History:  Diagnosis Date  . Arthritis    osteoarthritis  . Cancer ([place]) 1995   endometrial ca  . COPD (chronic obstructive pulmonary disease) ([place])   . Diabetes mellitus without complication ([place])    type 2  . GERD (gastroesophageal reflux disease)   . History of kidney infection    had to have PICC line  . Neuropathy    both feet  . Osteopenia   .  Osteoporosis   . Pneumonia     Family History: Family History  Problem Relation Age of Onset  . Pancreatic cancer Mother   . Cancer Mother        pancreaitis  . Lung cancer Father   . Breast cancer Sister 68    Social History   Socioeconomic History  . Marital status: Widowed    Spouse name: Not on file  . Number of children: Not on file  . Years of education: Not on file  . Highest education level: Not on file  Occupational History  . Not on file  Social Needs  . Financial resource strain: Not on file  . Food insecurity:    Worry: Not on file    Inability: Not on file  . Transportation needs:    Medical: Not on file    Non-medical: Not on file  Tobacco Use  . Smoking status: Current Every Day Smoker    Packs/day: 0.50    Years: 40.00    Pack years: 20.00    Types: Cigarettes  . Smokeless tobacco: Never Used  Substance and Sexual Activity  . Alcohol use: Yes    Comment: occasional  . Drug use: No  . Sexual activity: Not on file  Lifestyle  . Physical activity:    Days per week: Not on file    Minutes per session: Not on file  . Stress: Not on file  Relationships  . Social connections:    Talks on phone: Not on file    Gets together: Not on file    Attends religious service: Not on file    Active member of club or organization: Not on file    Attends meetings of clubs or organizations: Not on file    Relationship status: Not on file  . Intimate partner violence:    Fear of current or ex partner: Not on file    Emotionally abused: Not on file    Physically abused: Not on file    Forced sexual activity: Not on file  Other Topics Concern  . Not on file  Social History Narrative  . Not on file   Review of Systems  Constitutional: Positive for fatigue. Negative for chills and diaphoresis.  HENT: Negative for ear pain, postnasal drip and sinus pressure.   Eyes: Negative for photophobia, discharge, redness, itching and visual disturbance.  Respiratory:  Positive for cough. Negative for shortness of breath and wheezing.   Cardiovascular: Negative for chest pain, palpitations and leg swelling.  Gastrointestinal: Negative for abdominal pain, constipation, diarrhea, nausea and vomiting.  Genitourinary: Negative for dysuria and flank pain.  Musculoskeletal: Positive for arthralgias. Negative for back pain, gait problem and neck pain.  Skin: Negative for color change.  Allergic/Immunologic: Negative for environmental allergies and food allergies.  Neurological: Negative for dizziness and headaches.  Hematological: Does not bruise/bleed easily.  Psychiatric/Behavioral: Negative for agitation, behavioral problems (depression) and hallucinations.    Vital Signs: BP (!) 146/84 (BP Location: Left Arm, Patient Position: Sitting)   Pulse 97   Resp 16   Ht 5\' 7"  (1.702 m)   Wt 137 lb 3.2 oz (62.2 kg)   SpO2 97%   BMI 21.49 kg/m    Physical Exam  Constitutional: She is oriented to person, place, and time. She appears well-developed and well-nourished. No distress.  HENT:  Head: Normocephalic and atraumatic.  Mouth/Throat: Oropharynx is clear and moist. No oropharyngeal exudate.  Eyes: Pupils are equal, round, and reactive to light. EOM are normal.  Neck: Normal range of motion. Neck supple. No JVD present. No tracheal deviation present. No thyromegaly present.  Cardiovascular: Normal rate, regular rhythm and normal heart sounds. Exam reveals no gallop and no friction rub.  No murmur heard. Pulmonary/Chest: She is in respiratory distress. She has wheezes. She has no rales. She exhibits no tenderness.  Abdominal: Soft. Bowel sounds are normal.  Musculoskeletal: Normal range of motion.  Lymphadenopathy:    She has no cervical adenopathy.  Neurological: She is alert and oriented to person, place, and time. No cranial nerve deficit.  Skin: Skin is warm and dry. She is not diaphoretic.  Psychiatric: She has a normal mood and affect. Her behavior  is normal. Judgment and thought content normal.   Assessment/Plan: 1. COPD (chronic obstructive pulmonary disease) with chronic bronchitis (HCC) - levofloxacin (LEVAQUIN) 500 MG tablet; Take 1 tablet (500 mg total) by mouth daily.  Dispense: 10 tablet; Refill: 0  2. Uncontrolled type 2 diabetes mellitus with hyperglycemia ([place]) - Continue meds as before  3. Acute cystitis without hematuria - F/U urine C/S  General Counseling: Sandy Salaam understanding of the findings of todays visit and agrees with plan of treatment. I have discussed any further diagnostic evaluation that may be needed or ordered today. We also reviewed her medications today. she has been encouraged to call the office with any questions or concerns that should arise related to todays visit.  Diabetes Counseling:  1. Addition of ACE inh/ ARB'S for nephroprotection. 2. Diabetic foot care, prevention of complications.  3.Exercise and lose weight.  4. Diabetic eye examination, 5. Monitor blood sugar closlely. nutrition counseling.  6.Sign and symptoms of hypoglycemia including shaking sweating,confusion and headaches.   Meds ordered this encounter  Medications  . levofloxacin (LEVAQUIN) 500 MG tablet    Sig: Take 1 tablet (500 mg total) by mouth daily.    Dispense:  10 tablet    Refill:  0    Time spent:25  Minutes   Dr Lavera Guise Internal medicine

## 2017-09-01 ENCOUNTER — Ambulatory Visit
Admission: RE | Admit: 2017-09-01 | Discharge: 2017-09-01 | Disposition: A | Discharge status: Home / Self Care | Payer: Medicare HMO | Source: Ambulatory Visit | Attending: Internal Medicine | Admitting: Internal Medicine

## 2017-09-01 DIAGNOSIS — Z1231 Encounter for screening mammogram for malignant neoplasm of breast: Secondary | ICD-10-CM | POA: Insufficient documentation

## 2017-09-01 DIAGNOSIS — Z1239 Encounter for other screening for malignant neoplasm of breast: Secondary | ICD-10-CM

## 2017-10-14 ENCOUNTER — Other Ambulatory Visit: Payer: Self-pay | Admitting: Internal Medicine

## 2017-10-27 ENCOUNTER — Encounter: Payer: Self-pay | Admitting: Nurse Practitioner

## 2017-10-27 ENCOUNTER — Ambulatory Visit (INDEPENDENT_AMBULATORY_CARE_PROVIDER_SITE_OTHER): Payer: Medicare HMO | Admitting: Nurse Practitioner

## 2017-10-27 VITALS — BP 155/71 | HR 80 | Resp 16 | Ht 67.0 in | Wt 137.4 lb

## 2017-10-27 DIAGNOSIS — R300 Dysuria: Secondary | ICD-10-CM | POA: Diagnosis not present

## 2017-10-27 DIAGNOSIS — J449 Chronic obstructive pulmonary disease, unspecified: Secondary | ICD-10-CM

## 2017-10-27 DIAGNOSIS — E1165 Type 2 diabetes mellitus with hyperglycemia: Secondary | ICD-10-CM | POA: Diagnosis not present

## 2017-10-27 DIAGNOSIS — I10 Essential (primary) hypertension: Secondary | ICD-10-CM | POA: Diagnosis not present

## 2017-10-27 DIAGNOSIS — J4489 Other specified chronic obstructive pulmonary disease: Secondary | ICD-10-CM | POA: Insufficient documentation

## 2017-10-27 DIAGNOSIS — Z0001 Encounter for general adult medical examination with abnormal findings: Secondary | ICD-10-CM

## 2017-10-27 DIAGNOSIS — G629 Polyneuropathy, unspecified: Secondary | ICD-10-CM | POA: Diagnosis not present

## 2017-10-27 DIAGNOSIS — Z1239 Encounter for other screening for malignant neoplasm of breast: Secondary | ICD-10-CM | POA: Insufficient documentation

## 2017-10-27 LAB — POCT GLYCOSYLATED HEMOGLOBIN (HGB A1C): Hemoglobin A1C: 8.3 % — AB (ref 4.0–5.6)

## 2017-10-27 MED ORDER — LISINOPRIL 10 MG PO TABS: 10.0000 mg | ORAL_TABLET | Freq: Every day | ORAL | 4 refills | Status: DC

## 2017-10-27 NOTE — Progress Notes (Signed)
[facility] [place], [place] [address]  Internal MEDICINE  Office Visit Note  Patient Name: Alexandra Yang  [id number]  [id number]  Date of Service: 11/15/2017   Pt is here for routine health maintenance examination  Chief Complaint  Patient presents with  . Annual Exam  . Diabetes     Diabetes  She presents for her follow-up diabetic visit. She has type 2 diabetes mellitus. Her disease course has been improving. Hypoglycemia symptoms include sweats. Pertinent negatives for hypoglycemia include no dizziness or headaches. Associated symptoms include fatigue. Pertinent negatives for diabetes include no chest pain. There are no hypoglycemic complications. Symptoms are improving. Diabetic complications include peripheral neuropathy. Risk factors for coronary artery disease include diabetes mellitus, dyslipidemia, hypertension, post-menopausal and tobacco exposure. Current diabetic treatment includes insulin injections. She is compliant with treatment most of the time. Her weight is stable. She is following a generally healthy diet. Meal planning includes avoidance of concentrated sweets. She has not had a previous visit with a dietitian. She rarely participates in exercise. Her home blood glucose trend is decreasing steadily. An ACE inhibitor/angiotensin II receptor blocker is being taken. She does not see a podiatrist.Eye exam is current.     Current Medication: Outpatient Encounter Medications as of 10/27/2017  Medication Sig  . albuterol (PROVENTIL) (2.5 MG/3ML) 0.083% nebulizer solution Take 2.5 mg by nebulization every 6 (six) hours as needed for wheezing or shortness of breath.  Marland Kitchen aspirin 81 MG chewable tablet Chew 81 mg by mouth every evening.   . benzonatate (TESSALON) 200 MG capsule Take 1 capsule (200 mg total) by mouth 3 (three) times daily as needed for cough.  . cholecalciferol (VITAMIN D) 1000 UNITS tablet Take 1,000 Units by mouth daily.  .  ertapenem 1 g in sodium chloride 0.9 % 50 mL Inject 1 g into the vein daily.  . hydrOXYzine (ATARAX/VISTARIL) 10 MG tablet Take 1 tablet (10 mg total) by mouth 3 (three) times daily as needed for itching.  Marland Kitchen ibuprofen (ADVIL,MOTRIN) 800 MG tablet Take 1 tablet (800 mg total) by mouth 3 (three) times daily as needed.  . Insulin Glargine (TOUJEO MAX SOLOSTAR) 300 UNIT/ML SOPN Inject 24 Units into the skin 1 day or 1 dose for 1 dose.  . Insulin Pen Needle 32G X 4 MM MISC USE AS ONCE DAILY  . levofloxacin (LEVAQUIN) 500 MG tablet Take 1 tablet (500 mg total) by mouth daily.  Marland Kitchen lisinopril (PRINIVIL,ZESTRIL) 10 MG tablet Take 1 tablet (10 mg total) by mouth daily.  . metFORMIN (GLUCOPHAGE-XR) 500 MG 24 hr tablet TAKE 1 TABLET EVERY NIGHT  . metoCLOPramide (REGLAN) 10 MG tablet Take 1 tablet (10 mg total) by mouth daily as needed.  . Multiple Vitamins-Minerals (CENTRUM SILVER PO) Take 1 tablet by mouth daily.  . nitrofurantoin, macrocrystal-monohydrate, (MACROBID) 100 MG capsule Take 1 capsule (100 mg total) by mouth 2 (two) times daily.  . ondansetron (ZOFRAN) 4 MG tablet Take 1 tablet (4 mg total) by mouth every 8 (eight) hours as needed for nausea or vomiting.  . pregabalin (LYRICA) 75 MG capsule Take 75 mg by mouth at bedtime as needed (pain).   . simvastatin (ZOCOR) 40 MG tablet Take 1 tablet (40 mg total) by mouth at bedtime.  Marland Kitchen VICTOZA 18 MG/3ML SOPN Inject 1.8 mg  daily  . vitamin B-12 (CYANOCOBALAMIN) 1000 MCG tablet Take 1,000 mcg by mouth daily.   . [DISCONTINUED] lisinopril (PRINIVIL,ZESTRIL) 5 MG tablet Take 5 mg by mouth daily.  No facility-administered encounter medications on file as of 10/27/2017.     Surgical History: Past Surgical History:  Procedure Laterality Date  . ABDOMINAL HYSTERECTOMY    . CHOLECYSTECTOMY    . KNEE ARTHROSCOPY Right   . TENDON TRANSFER Right 01/20/2015   Procedure: RIGHT HAND AND MIDDLE FINGER TENDON RECONSTRUCTION AND TRANSFER;  Surgeon: Roseanne Kaufman,  MD;  Location: [place];  Service: Orthopedics;  Laterality: Right;    Medical History: Past Medical History:  Diagnosis Date  . Arthritis    osteoarthritis  . Cancer ([place]) 1995   endometrial ca  . COPD (chronic obstructive pulmonary disease) ([place])   . Diabetes mellitus without complication ([place])    type 2  . GERD (gastroesophageal reflux disease)   . History of kidney infection    had to have PICC line  . Neuropathy    both feet  . Osteopenia   . Osteoporosis   . Pneumonia     Family History: Family History  Problem Relation Age of Onset  . Pancreatic cancer Mother   . Cancer Mother        pancreaitis  . Lung cancer Father   . Breast cancer Sister 15      Review of Systems  Constitutional: Positive for fatigue. Negative for activity change, chills and diaphoresis.  HENT: Negative for ear pain, postnasal drip and sinus pressure.   Eyes: Negative.  Negative for photophobia, discharge, redness, itching and visual disturbance.  Respiratory: Positive for cough. Negative for shortness of breath and wheezing.   Cardiovascular: Negative for chest pain, palpitations and leg swelling.  Gastrointestinal: Negative for abdominal pain, constipation, diarrhea, nausea and vomiting.  Endocrine:       Blood sugars running high. Night sweats, intermittently    Genitourinary: Negative for dysuria and flank pain.  Musculoskeletal: Positive for arthralgias. Negative for back pain, gait problem and neck pain.  Skin: Negative for color change.  Allergic/Immunologic: Positive for environmental allergies. Negative for food allergies.  Neurological: Negative for dizziness and headaches.  Hematological: Does not bruise/bleed easily.  Psychiatric/Behavioral: Negative for agitation, behavioral problems (depression) and hallucinations.     Today's Vitals   10/27/17 0903  BP: (!) 155/71  Pulse: 80  Resp: 16  SpO2: 97%  Weight: 137 lb 6.4 oz (62.3 kg)  Height: '5\' 7"'  (1.702 m)     Physical Exam  Constitutional: She is oriented to person, place, and time. She appears well-developed and well-nourished. No distress.  HENT:  Head: Normocephalic and atraumatic.  Nose: Nose normal.  Mouth/Throat: Oropharynx is clear and moist. No oropharyngeal exudate.  Eyes: Pupils are equal, round, and reactive to light. EOM are normal.  Neck: Normal range of motion. Neck supple. No JVD present. Carotid bruit is not present. No tracheal deviation present. No thyromegaly present.  Cardiovascular: Normal rate, regular rhythm, normal heart sounds and intact distal pulses. Exam reveals no gallop and no friction rub.  No murmur heard. Pulses:      Dorsalis pedis pulses are 2+ on the right side, and 2+ on the left side.       Posterior tibial pulses are 2+ on the right side, and 2+ on the left side.  Pulmonary/Chest: Effort normal and breath sounds normal. No respiratory distress. She has no wheezes. She has no rales. She exhibits no tenderness. Right breast exhibits no inverted nipple, no mass, no nipple discharge, no skin change and no tenderness. Left breast exhibits no inverted nipple, no mass, no nipple discharge, no skin change  and no tenderness.  Abdominal: Soft. Bowel sounds are normal. There is no tenderness.  Musculoskeletal: Normal range of motion.       Right foot: There is normal range of motion and no deformity.       Left foot: There is normal range of motion and no deformity.  Feet:  Right Foot:  Protective Sensation: 6 sites tested. 6 sites sensed.  Left Foot:  Protective Sensation: 6 sites tested. 6 sites sensed.  Lymphadenopathy:    She has no cervical adenopathy.  Neurological: She is alert and oriented to person, place, and time. No cranial nerve deficit.  Skin: Skin is warm and dry. Capillary refill takes less than 2 seconds. She is not diaphoretic.  Psychiatric: She has a normal mood and affect. Her behavior is normal. Judgment and thought content normal.   Nursing note and vitals reviewed.    LABS: Recent Results (from the past 2160 hour(s))  CBC with Differential     Status: Abnormal   Collection Time: 08/23/17  3:30 PM  Result Value Ref Range   WBC 11.5 (H) 3.6 - 11.0 K/uL   RBC 4.58 3.80 - 5.20 MIL/uL   Hemoglobin 14.1 12.0 - 16.0 g/dL   HCT 42.6 35.0 - 47.0 %   MCV 93.0 80.0 - 100.0 fL   MCH 30.8 26.0 - 34.0 pg   MCHC 33.1 32.0 - 36.0 g/dL   RDW 13.8 11.5 - 14.5 %   Platelets 272 150 - 440 K/uL   Neutrophils Relative % 80 %   Neutro Abs 9.3 (H) 1.4 - 6.5 K/uL   Lymphocytes Relative 14 %   Lymphs Abs 1.5 1.0 - 3.6 K/uL   Monocytes Relative 4 %   Monocytes Absolute 0.5 0.2 - 0.9 K/uL   Eosinophils Relative 1 %   Eosinophils Absolute 0.1 0 - 0.7 K/uL   Basophils Relative 1 %   Basophils Absolute 0.1 0 - 0.1 K/uL    Comment: Performed at [facility], [address] [place], [place] [address]  Basic metabolic panel     Status: Abnormal   Collection Time: 08/23/17  3:30 PM  Result Value Ref Range   Sodium 135 135 - 145 mmol/L   Potassium 4.3 3.5 - 5.1 mmol/L   Chloride 105 101 - 111 mmol/L   CO2 23 22 - 32 mmol/L   Glucose, Bld 221 (H) 65 - 99 mg/dL   BUN 27 (H) 6 - 20 mg/dL   Creatinine, Ser 0.87 0.44 - 1.00 mg/dL   Calcium 9.7 8.9 - 10.3 mg/dL   GFR calc non Af Amer >60 >60 mL/min   GFR calc Af Amer >60 >60 mL/min    Comment: (NOTE) The eGFR has been calculated using the CKD EPI equation. This calculation has not been validated in all clinical situations. eGFR's persistently <60 mL/min signify possible Chronic Kidney Disease.    Anion gap 7 5 - 15    Comment: Performed at [facility], [address] [place], [place] [address]  Urinalysis, Routine w reflex microscopic     Status: Abnormal   Collection Time: 10/27/17  9:15 AM  Result Value Ref Range   Specific Gravity, UA 1.018 1.005 - 1.030   pH, UA 5.0 5.0 - 7.5   Color, UA Yellow Yellow   Appearance Ur Cloudy (A) Clear   Leukocytes, UA  2+ (A) Negative   Protein, UA Trace Negative/Trace   Glucose, UA Negative Negative   Ketones, UA Negative Negative  RBC, UA Trace (A) Negative   Bilirubin, UA Negative Negative   Urobilinogen, Ur 0.2 0.2 - 1.0 mg/dL   Nitrite, UA Negative Negative   Microscopic Examination See below:     Comment: Microscopic was indicated and was performed.  Microscopic Examination     Status: Abnormal   Collection Time: 10/27/17  9:15 AM  Result Value Ref Range   WBC, UA >30 (A) 0 - 5 /hpf   RBC, UA 3-10 (A) 0 - 2 /hpf   Epithelial Cells (non renal) 0-10 0 - 10 /hpf   Casts None seen None seen /lpf   Mucus, UA Present Not Estab.   Bacteria, UA Moderate (A) None seen/Few  POCT HgB A1C     Status: Abnormal   Collection Time: 10/27/17  9:24 AM  Result Value Ref Range   Hemoglobin A1C 8.3 (A) 4.0 - 5.6 %   HbA1c, POC (prediabetic range)  5.7 - 6.4 %   HbA1c, POC (controlled diabetic range)  0.0 - 7.0 %    Assessment/Plan: 1. Encounter for general adult medical examination with abnormal findings Annual wellness visit today.   2. Uncontrolled type 2 diabetes mellitus with hyperglycemia (HCC) - POCT HgB A1C 8.3. Continue diabetic medication as prescribed. emphasized importance of ADA diet   3. Essential hypertension - lisinopril (PRINIVIL,ZESTRIL) 10 MG tablet; Take 1 tablet (10 mg total) by mouth daily.  Dispense: 90 tablet; Refill: 4  4. COPD (chronic obstructive pulmonary disease) with chronic bronchitis (HCC) Continue to use inhaler as needed and as prescribed.   5. Neuropathy Continue to take lyrica 2m at bedtime as needed.   General Counseling: Lalaya iversonunderstanding of the findings of todays visit and agrees with plan of treatment. I have discussed any further diagnostic evaluation that may be needed or ordered today. We also reviewed her medications today. she has been encouraged to call the office with any questions or concerns that should arise related to todays  visit.    Counseling:  Diabetes Counseling:  1. Addition of ACE inh/ ARB'S for nephroprotection. 2. Diabetic foot care, prevention of complications.  3.Exercise and lose weight.  4. Diabetic eye examination, 5. Monitor blood sugar closlely. nutrition counseling.  6.Sign and symptoms of hypoglycemia including shaking sweating,confusion and headaches.   This patient was seen by HLeretha Pol FNP- C in Collaboration with Dr FLavera Guiseas a part of collaborative care agreement  Orders Placed This Encounter  Procedures  . Microscopic Examination  . Urinalysis, Routine w reflex microscopic  . POCT HgB A1C    Meds ordered this encounter  Medications  . lisinopril (PRINIVIL,ZESTRIL) 10 MG tablet    Sig: Take 1 tablet (10 mg total) by mouth daily.    Dispense:  90 tablet    Refill:  4    Please note increased dose.    Order Specific Question:   Supervising Provider    Answer:   KLavera Guise[[[id number]   Time spent: 3[place] MD  Internal Medicine

## 2017-10-28 LAB — URINALYSIS, ROUTINE W REFLEX MICROSCOPIC
BILIRUBIN UA: NEGATIVE
GLUCOSE, UA: NEGATIVE
Ketones, UA: NEGATIVE
Nitrite, UA: NEGATIVE
Specific Gravity, UA: 1.018 (ref 1.005–1.030)
UUROB: 0.2 mg/dL (ref 0.2–1.0)
pH, UA: 5 (ref 5.0–7.5)

## 2017-10-28 LAB — MICROSCOPIC EXAMINATION: Casts: NONE SEEN /lpf

## 2017-11-15 DIAGNOSIS — R300 Dysuria: Secondary | ICD-10-CM | POA: Insufficient documentation

## 2017-11-20 ENCOUNTER — Other Ambulatory Visit: Payer: Self-pay

## 2017-11-20 DIAGNOSIS — I10 Essential (primary) hypertension: Secondary | ICD-10-CM

## 2017-11-20 MED ORDER — LISINOPRIL 10 MG PO TABS: 10.0000 mg | ORAL_TABLET | Freq: Every day | ORAL | 4 refills | Status: DC

## 2017-11-20 MED ORDER — IBUPROFEN 800 MG PO TABS: 800.0000 mg | ORAL_TABLET | Freq: Three times a day (TID) | ORAL | 1 refills | Status: DC | PRN

## 2017-11-20 NOTE — Telephone Encounter (Signed)
PT CALLED STATING THAT SHE DIDN'T RECEIVE THE LISINOPRIL FROM [facility] AND WANTED TO GET PRESCRIPTION SENT TO [facility] IN [place] FOR IBUPROFEN AND LISINOPRIL

## 2018-01-04 ENCOUNTER — Encounter: Payer: Self-pay | Admitting: Nurse Practitioner

## 2018-01-04 ENCOUNTER — Ambulatory Visit (INDEPENDENT_AMBULATORY_CARE_PROVIDER_SITE_OTHER): Payer: Medicare HMO | Admitting: Nurse Practitioner

## 2018-01-04 VITALS — BP 136/64 | HR 113 | Temp 98.8°F | Resp 16 | Ht 67.0 in | Wt 137.8 lb

## 2018-01-04 DIAGNOSIS — I10 Essential (primary) hypertension: Secondary | ICD-10-CM

## 2018-01-04 DIAGNOSIS — J449 Chronic obstructive pulmonary disease, unspecified: Secondary | ICD-10-CM

## 2018-01-04 DIAGNOSIS — R05 Cough: Secondary | ICD-10-CM | POA: Diagnosis not present

## 2018-01-04 DIAGNOSIS — R059 Cough, unspecified: Secondary | ICD-10-CM | POA: Insufficient documentation

## 2018-01-04 DIAGNOSIS — E1165 Type 2 diabetes mellitus with hyperglycemia: Secondary | ICD-10-CM

## 2018-01-04 DIAGNOSIS — J4489 Other specified chronic obstructive pulmonary disease: Secondary | ICD-10-CM

## 2018-01-04 MED ORDER — BENZONATATE 200 MG PO CAPS: 200.0000 mg | ORAL_CAPSULE | Freq: Three times a day (TID) | ORAL | 0 refills | Status: DC | PRN

## 2018-01-04 MED ORDER — LEVOFLOXACIN 500 MG PO TABS: 500.0000 mg | ORAL_TABLET | Freq: Every day | ORAL | 0 refills | Status: DC

## 2018-01-04 NOTE — Progress Notes (Signed)
[facility] [place], [place] [address]  Internal MEDICINE  Office Visit Note  Patient Name: Alexandra Yang  [id number]  [id number]  Date of Service: 01/15/2018   Pt is here for a sick visit.   Chief Complaint  Patient presents with  . URI  . Cough    coughing up green mucous been going on since friday  . Nausea  . Fever    last time checked temp it was 101.0 on friday  . Chills    feeling weak and clammy also very fatigued and sweats. pt stated she has been around a pt with ESBL.  Marland Kitchen COPD     URI   This is a new problem. The current episode started in the past 7 days. The problem has been unchanged. The maximum temperature recorded prior to her arrival was 101 - 101.9 F. The fever has been present for 1 to 2 days. Associated symptoms include congestion, coughing, headaches, nausea, sinus pain and wheezing. Pertinent negatives include no abdominal pain, chest pain, diarrhea, dysuria, ear pain, neck pain or vomiting. She has tried NSAIDs (zofran for nausea. ) for the symptoms. The treatment provided mild relief.        Current Medication:  Outpatient Encounter Medications as of 01/04/2018  Medication Sig  . albuterol (PROVENTIL) (2.5 MG/3ML) 0.083% nebulizer solution Take 2.5 mg by nebulization every 6 (six) hours as needed for wheezing or shortness of breath.  Marland Kitchen aspirin 81 MG chewable tablet Chew 81 mg by mouth every evening.   . benzonatate (TESSALON) 200 MG capsule Take 1 capsule (200 mg total) by mouth 3 (three) times daily as needed for cough.  . cholecalciferol (VITAMIN D) 1000 UNITS tablet Take 1,000 Units by mouth daily.  . ertapenem 1 g in sodium chloride 0.9 % 50 mL Inject 1 g into the vein daily.  . hydrOXYzine (ATARAX/VISTARIL) 10 MG tablet Take 1 tablet (10 mg total) by mouth 3 (three) times daily as needed for itching.  Marland Kitchen ibuprofen (ADVIL,MOTRIN) 800 MG tablet Take 1 tablet (800 mg total) by mouth 3 (three) times daily as needed.  .  Insulin Pen Needle 32G X 4 MM MISC USE AS ONCE DAILY  . levofloxacin (LEVAQUIN) 500 MG tablet Take 1 tablet (500 mg total) by mouth daily.  Marland Kitchen lisinopril (PRINIVIL,ZESTRIL) 10 MG tablet Take 1 tablet (10 mg total) by mouth daily.  . metFORMIN (GLUCOPHAGE-XR) 500 MG 24 hr tablet TAKE 1 TABLET EVERY NIGHT  . metoCLOPramide (REGLAN) 10 MG tablet Take 1 tablet (10 mg total) by mouth daily as needed.  . Multiple Vitamins-Minerals (CENTRUM SILVER PO) Take 1 tablet by mouth daily.  . ondansetron (ZOFRAN) 4 MG tablet Take 1 tablet (4 mg total) by mouth every 8 (eight) hours as needed for nausea or vomiting.  . pregabalin (LYRICA) 75 MG capsule Take 75 mg by mouth at bedtime as needed (pain).   . simvastatin (ZOCOR) 40 MG tablet Take 1 tablet (40 mg total) by mouth at bedtime.  Marland Kitchen VICTOZA 18 MG/3ML SOPN Inject 1.8 mg  daily  . vitamin B-12 (CYANOCOBALAMIN) 1000 MCG tablet Take 1,000 mcg by mouth daily.   . [DISCONTINUED] benzonatate (TESSALON) 200 MG capsule Take 1 capsule (200 mg total) by mouth 3 (three) times daily as needed for cough.  . [DISCONTINUED] levofloxacin (LEVAQUIN) 500 MG tablet Take 1 tablet (500 mg total) by mouth daily.  . [DISCONTINUED] nitrofurantoin, macrocrystal-monohydrate, (MACROBID) 100 MG capsule Take 1 capsule (100 mg total) by  mouth 2 (two) times daily.  . Insulin Glargine (TOUJEO MAX SOLOSTAR) 300 UNIT/ML SOPN Inject 24 Units into the skin 1 day or 1 dose for 1 dose.   No facility-administered encounter medications on file as of 01/04/2018.       Medical History: Past Medical History:  Diagnosis Date  . Arthritis    osteoarthritis  . Cancer ([place]) 1995   endometrial ca  . COPD (chronic obstructive pulmonary disease) ([place])   . Diabetes mellitus without complication ([place])    type 2  . GERD (gastroesophageal reflux disease)   . History of kidney infection    had to have PICC line  . Neuropathy    both feet  . Osteopenia   . Osteoporosis   . Pneumonia      Vital  Signs: BP 136/64 (BP Location: Right Arm, Patient Position: Sitting, Cuff Size: Normal)   Pulse (!) 113   Temp 98.8 F (37.1 C)   Resp 16   Ht 5\' 7"  (1.702 m)   Wt 137 lb 12.8 oz (62.5 kg)   SpO2 94%   BMI 21.58 kg/m    Review of Systems  Constitutional: Positive for chills, fatigue and fever. Negative for activity change and diaphoresis.  HENT: Positive for congestion, postnasal drip, sinus pressure and sinus pain. Negative for ear pain.   Eyes: Negative.  Negative for photophobia, discharge, redness, itching and visual disturbance.  Respiratory: Positive for cough, chest tightness and wheezing. Negative for shortness of breath.   Cardiovascular: Negative for chest pain, palpitations and leg swelling.  Gastrointestinal: Positive for nausea. Negative for abdominal pain, constipation, diarrhea and vomiting.  Endocrine: Negative for cold intolerance, heat intolerance and polydipsia.       Blood sugars running high. Night sweats, intermittently    Genitourinary: Negative for dysuria and flank pain.  Musculoskeletal: Positive for arthralgias and myalgias. Negative for back pain, gait problem and neck pain.  Skin: Negative for color change.  Allergic/Immunologic: Positive for environmental allergies. Negative for food allergies.  Neurological: Positive for headaches. Negative for dizziness.  Hematological: Negative for adenopathy. Does not bruise/bleed easily.  Psychiatric/Behavioral: Negative for agitation, behavioral problems (depression) and hallucinations.    Physical Exam  Constitutional: She is oriented to person, place, and time. She appears well-developed and well-nourished. No distress.  HENT:  Head: Normocephalic and atraumatic.  Nose: Rhinorrhea present.  Mouth/Throat: Oropharynx is clear and moist. No oropharyngeal exudate.  Eyes: Pupils are equal, round, and reactive to light. Conjunctivae and EOM are normal.  Neck: Normal range of motion. Neck supple. No JVD present.  No tracheal deviation present. No thyromegaly present.  Cardiovascular: Normal rate, regular rhythm and normal heart sounds. Exam reveals no gallop and no friction rub.  No murmur heard. Mild tachycardia   Pulmonary/Chest: Effort normal. No respiratory distress. She has wheezes. She has no rales. She exhibits no tenderness.  Abdominal: Soft. Bowel sounds are normal. There is no tenderness.  Musculoskeletal: Normal range of motion.  Lymphadenopathy:    She has cervical adenopathy.  Neurological: She is alert and oriented to person, place, and time. No cranial nerve deficit.  Skin: Skin is warm and dry. She is not diaphoretic.  Psychiatric: She has a normal mood and affect. Her behavior is normal. Judgment and thought content normal.  Nursing note and vitals reviewed.  Assessment/Plan: 1. COPD (chronic obstructive pulmonary disease) with chronic bronchitis (HCC) Start levofloxacin 500mg  daily for 10 days. Advised her to rest and increase fluids. Use OTC medication to reduce  symptoms.  - levofloxacin (LEVAQUIN) 500 MG tablet; Take 1 tablet (500 mg total) by mouth daily.  Dispense: 10 tablet; Refill: 0  2. Cough Tessalon perls may be taken up to three times daily if needed for cough.  - benzonatate (TESSALON) 200 MG capsule; Take 1 capsule (200 mg total) by mouth 3 (three) times daily as needed for cough.  Dispense: 30 capsule; Refill: 0  3. Essential hypertension conitnue bp medication as prescribed .  4. Uncontrolled type 2 diabetes mellitus with hyperglycemia ([place]) Continue diabetic medication as prescribed.  General Counseling: tunisia landgrebe understanding of the findings of todays visit and agrees with plan of treatment. I have discussed any further diagnostic evaluation that may be needed or ordered today. We also reviewed her medications today. she has been encouraged to call the office with any questions or concerns that should arise related to todays  visit.    Counseling:  Rest and increase fluids. Continue using OTC medication to control symptoms.   This patient was seen by [place] with Dr Lavera Guise as a part of collaborative care agreement  Meds ordered this encounter  Medications  . levofloxacin (LEVAQUIN) 500 MG tablet    Sig: Take 1 tablet (500 mg total) by mouth daily.    Dispense:  10 tablet    Refill:  0    Order Specific Question:   Supervising Provider    Answer:   Lavera Guise [[id number]]  . benzonatate (TESSALON) 200 MG capsule    Sig: Take 1 capsule (200 mg total) by mouth 3 (three) times daily as needed for cough.    Dispense:  30 capsule    Refill:  0    Order Specific Question:   Supervising Provider    Answer:   Lavera Guise [[id number]]    Time spent: 15 Minutes

## 2018-01-23 ENCOUNTER — Encounter: Payer: Self-pay | Admitting: *Deleted

## 2018-01-23 ENCOUNTER — Other Ambulatory Visit: Payer: Self-pay

## 2018-01-23 ENCOUNTER — Emergency Department: Payer: Medicare HMO

## 2018-01-23 ENCOUNTER — Emergency Department
Admission: EM | Admit: 2018-01-23 | Discharge: 2018-01-24 | Disposition: A | Discharge status: Home / Self Care | Payer: Medicare HMO | Attending: Emergency Medicine | Admitting: Emergency Medicine

## 2018-01-23 DIAGNOSIS — E119 Type 2 diabetes mellitus without complications: Secondary | ICD-10-CM | POA: Insufficient documentation

## 2018-01-23 DIAGNOSIS — Z8542 Personal history of malignant neoplasm of other parts of uterus: Secondary | ICD-10-CM | POA: Insufficient documentation

## 2018-01-23 DIAGNOSIS — R112 Nausea with vomiting, unspecified: Secondary | ICD-10-CM

## 2018-01-23 DIAGNOSIS — N3000 Acute cystitis without hematuria: Secondary | ICD-10-CM | POA: Insufficient documentation

## 2018-01-23 DIAGNOSIS — J449 Chronic obstructive pulmonary disease, unspecified: Secondary | ICD-10-CM | POA: Insufficient documentation

## 2018-01-23 DIAGNOSIS — I10 Essential (primary) hypertension: Secondary | ICD-10-CM | POA: Diagnosis not present

## 2018-01-23 DIAGNOSIS — R05 Cough: Secondary | ICD-10-CM | POA: Diagnosis not present

## 2018-01-23 DIAGNOSIS — F17210 Nicotine dependence, cigarettes, uncomplicated: Secondary | ICD-10-CM | POA: Insufficient documentation

## 2018-01-23 DIAGNOSIS — R109 Unspecified abdominal pain: Secondary | ICD-10-CM | POA: Diagnosis present

## 2018-01-23 LAB — COMPREHENSIVE METABOLIC PANEL
ALBUMIN: 4.5 g/dL (ref 3.5–5.0)
ALT: 15 U/L (ref 0–44)
ANION GAP: 9 (ref 5–15)
AST: 19 U/L (ref 15–41)
Alkaline Phosphatase: 76 U/L (ref 38–126)
BILIRUBIN TOTAL: 0.7 mg/dL (ref 0.3–1.2)
BUN: 20 mg/dL (ref 8–23)
CO2: 26 mmol/L (ref 22–32)
Calcium: 9.8 mg/dL (ref 8.9–10.3)
Chloride: 106 mmol/L (ref 98–111)
Creatinine, Ser: 0.54 mg/dL (ref 0.44–1.00)
GFR calc non Af Amer: >60 mL/min (ref >60)
GLUCOSE: 181 mg/dL — AB (ref 70–99)
POTASSIUM: 4.1 mmol/L (ref 3.5–5.1)
SODIUM: 141 mmol/L (ref 135–145)
Total Protein: 8 g/dL (ref 6.5–8.1)

## 2018-01-23 LAB — URINALYSIS, COMPLETE (UACMP) WITH MICROSCOPIC
Bilirubin Urine: NEGATIVE
GLUCOSE, UA: NEGATIVE mg/dL
HGB URINE DIPSTICK: NEGATIVE
Ketones, ur: 5 mg/dL — AB
NITRITE: POSITIVE — AB
PH: 8 (ref 5.0–8.0)
Protein, ur: NEGATIVE mg/dL
SPECIFIC GRAVITY, URINE: 1.017 (ref 1.005–1.030)
WBC, UA: >50 WBC/hpf — ABNORMAL HIGH (ref 0–5)

## 2018-01-23 LAB — CBC
HEMATOCRIT: 37.4 % (ref 35.0–47.0)
HEMOGLOBIN: 12.8 g/dL (ref 12.0–16.0)
MCH: 31 pg (ref 26.0–34.0)
MCHC: 34.3 g/dL (ref 32.0–36.0)
MCV: 90.5 fL (ref 80.0–100.0)
PLATELETS: 280 10*3/uL (ref 150–440)
RBC: 4.13 MIL/uL (ref 3.80–5.20)
RDW: 13.4 % (ref 11.5–14.5)
WBC: 6.2 10*3/uL (ref 3.6–11.0)

## 2018-01-23 LAB — GLUCOSE, CAPILLARY: GLUCOSE-CAPILLARY: 172 mg/dL — AB (ref 70–99)

## 2018-01-23 LAB — LIPASE, BLOOD: Lipase: 35 U/L (ref 11–51)

## 2018-01-23 MED ORDER — CEPHALEXIN 500 MG PO CAPS: 500.0000 mg | ORAL_CAPSULE | Freq: Four times a day (QID) | ORAL | 0 refills | Status: AC

## 2018-01-23 MED ORDER — SODIUM CHLORIDE 0.9 % IV SOLN
1.0000 g | Freq: Once | INTRAVENOUS | Status: AC
  Administered 2018-01-23: 1 g via INTRAVENOUS
  Filled 2018-01-23: qty 10

## 2018-01-23 MED ORDER — ONDANSETRON HCL 4 MG/2ML IJ SOLN: 4.0000 mg | Freq: Once | INTRAMUSCULAR | Status: DC | PRN

## 2018-01-23 MED ORDER — ONDANSETRON HCL 4 MG/2ML IJ SOLN
4.0000 mg | Freq: Once | INTRAMUSCULAR | Status: AC
  Administered 2018-01-23: 4 mg via INTRAVENOUS
  Filled 2018-01-23: qty 2

## 2018-01-23 MED ORDER — ONDANSETRON 4 MG PO TBDP: 4.0000 mg | ORAL_TABLET | Freq: Three times a day (TID) | ORAL | 0 refills | Status: DC | PRN

## 2018-01-23 MED ORDER — HYDROCOD POLST-CPM POLST ER 10-8 MG/5ML PO SUER: 5.0000 mL | Freq: Once | ORAL | Status: DC

## 2018-01-23 MED ORDER — SODIUM CHLORIDE 0.9 % IV SOLN
Freq: Once | INTRAVENOUS | Status: AC
  Administered 2018-01-23: 21:00:00 via INTRAVENOUS

## 2018-01-23 MED ORDER — METOCLOPRAMIDE HCL 5 MG/ML IJ SOLN
10.0000 mg | Freq: Once | INTRAMUSCULAR | Status: DC
Start: 2018-01-23 — End: 2018-01-23

## 2018-01-23 MED ORDER — ONDANSETRON HCL 4 MG/2ML IJ SOLN
4.0000 mg | Freq: Once | INTRAMUSCULAR | Status: DC
Start: 2018-01-23 — End: 2018-01-23

## 2018-01-23 NOTE — ED Triage Notes (Addendum)
Pt reporting abd pain and nausea with generalized body aches. Pt has had sinus pain and congestion for the past 2 weeks. Pt reports she just finished a Levaquin prescription her PCP prescribed for an upper resp. Infection.   Pt also reporting nausea and intermittent vomiting. Decreased urine output reported but pt also reporting decreased PO intake.   Pt unsure if she has had fevers but reports hot and cold flashes.   Blood glucose has been elevated in the 300s this week.   Pt also reporting concern for ESBL and reports having had it three times in the past couple years.

## 2018-01-23 NOTE — ED Provider Notes (Signed)
[facility] Emergency Department Provider Note       Time seen: ----------------------------------------- 10:45 PM on 01/23/2018 -----------------------------------------   I have reviewed the triage vital signs and the nursing notes.  HISTORY   Chief Complaint Generalized Body Aches and Abdominal Pain    HPI Alexandra Yang is a 72 y.o. female with a history of arthritis, and Dimitriu cancer, COPD, diabetes, GERD, pneumonia who presents to the ED for abdominal pain as well as nausea and generalized body aches.  Patient has had sinus pain and congestion for the past 2 weeks.  Patient finished a Levaquin prescription for upper respiratory infection.  She has had nausea and intermittent vomiting.  Blood sugars have also been elevated this week.  Patient is also reporting concern for ESBL as she has had it before.  Past Medical History:  Diagnosis Date  . Arthritis    osteoarthritis  . Cancer ([place]) 1995   endometrial ca  . COPD (chronic obstructive pulmonary disease) ([place])   . Diabetes mellitus without complication ([place])    type 2  . GERD (gastroesophageal reflux disease)   . History of kidney infection    had to have PICC line  . Neuropathy    both feet  . Osteopenia   . Osteoporosis   . Pneumonia     Patient Active Problem List   Diagnosis Date Noted  . Cough 01/04/2018  . Dysuria 11/15/2017  . Neuropathy 10/27/2017  . Encounter for general adult medical examination with abnormal findings 10/27/2017  . Uncontrolled type 2 diabetes mellitus with hyperglycemia ([place]) 10/27/2017  . COPD (chronic obstructive pulmonary disease) with chronic bronchitis ([place]) 10/27/2017  . Sepsis ([place]) 12/19/2016  . Primary osteoarthritis of right hand 06/13/2014  . Inflammation of joint of right knee 11/10/2013  . Diabetes mellitus ([place]) 09/25/2011  . Essential hypertension 09/25/2011  . COPD (chronic obstructive pulmonary disease) ([place]) 09/25/2011    Past  Surgical History:  Procedure Laterality Date  . ABDOMINAL HYSTERECTOMY    . CHOLECYSTECTOMY    . KNEE ARTHROSCOPY Right   . TENDON TRANSFER Right 01/20/2015   Procedure: RIGHT HAND AND MIDDLE FINGER TENDON RECONSTRUCTION AND TRANSFER;  Surgeon: Roseanne Kaufman, MD;  Location: [facility];  Service: Orthopedics;  Laterality: Right;    Allergies Sulfa antibiotics; Hydrocodone-guaifenesin; Oxycodone hcl; Percodan [oxycodone-aspirin]; Ciprofloxacin; Naproxen; and Ultram [tramadol]  Social History Social History   Tobacco Use  . Smoking status: Current Every Day Smoker    Packs/day: 0.50    Years: 40.00    Pack years: 20.00    Types: Cigarettes  . Smokeless tobacco: Never Used  Substance Use Topics  . Alcohol use: Yes    Comment: occasional  . Drug use: No   Review of Systems Constitutional: Negative for fever. ENT: Positive for cough and congestion recently Cardiovascular: Negative for chest pain. Respiratory: Negative for shortness of breath. Gastrointestinal: Negative for abdominal pain, positive for nausea vomiting Musculoskeletal: Negative for back pain. Skin: Negative for rash. Neurological: Negative for headaches, focal weakness or numbness.  All systems negative/normal/unremarkable except as stated in the HPI  ____________________________________________   PHYSICAL EXAM:  VITAL SIGNS: ED Triage Vitals  Enc Vitals Group     BP 01/23/18 1741 (!) 141/69     Pulse Rate 01/23/18 1741 (!) 107     Resp 01/23/18 1741 16     Temp 01/23/18 1744 98.4 F (36.9 C)     Temp Source 01/23/18 1744 Oral     SpO2 01/23/18 1741  95 %     Weight 01/23/18 1741 137 lb 12.6 oz (62.5 kg)     Height 01/23/18 1741 5\' 7"  (1.702 m)     Head Circumference --      Peak Flow --      Pain Score 01/23/18 1741 7     Pain Loc --      Pain Edu? --      Excl. in [place]? --    Constitutional: Alert and oriented.  No acute distress Eyes: Conjunctivae are normal. Normal extraocular movements. ENT    Head: Normocephalic and atraumatic.   Nose: No congestion/rhinnorhea.   Mouth/Throat: Mucous membranes are moist.   Neck: No stridor. Cardiovascular: Normal rate, regular rhythm. No murmurs, rubs, or gallops. Respiratory: Normal respiratory effort without tachypnea nor retractions. Breath sounds are clear and equal bilaterally. No wheezes/rales/rhonchi. Gastrointestinal: Soft and nontender. Normal bowel sounds Musculoskeletal: Nontender with normal range of motion in extremities. No lower extremity tenderness nor edema. Neurologic:  Normal speech and language. No gross focal neurologic deficits are appreciated.  Skin:  Skin is warm, dry and intact. No rash noted. Psychiatric: Mood and affect are normal. Speech and behavior are normal.  ____________________________________________  ED COURSE:  As part of my medical decision making, I reviewed the following data within the [place] History obtained from family if available, nursing notes, old chart and ekg, as well as notes from prior ED visits. Patient presented for pain with vomiting and body aches, we will assess with labs and imaging as indicated at this time.   Procedures ____________________________________________   LABS (pertinent positives/negatives)  Labs Reviewed  COMPREHENSIVE METABOLIC PANEL - Abnormal; Notable for the following components:      Result Value   Glucose, Bld 181 (*)    All other components within normal limits  URINALYSIS, COMPLETE (UACMP) WITH MICROSCOPIC - Abnormal; Notable for the following components:   Color, Urine YELLOW (*)    APPearance CLOUDY (*)    Ketones, ur 5 (*)    Nitrite POSITIVE (*)    Leukocytes, UA LARGE (*)    WBC, UA >50 (*)    Bacteria, UA FEW (*)    All other components within normal limits  GLUCOSE, CAPILLARY - Abnormal; Notable for the following components:   Glucose-Capillary 172 (*)    All other components within normal limits  URINE CULTURE   LIPASE, BLOOD  CBC    RADIOLOGY  Chest x-ray does not reveal any acute process  ____________________________________________  DIFFERENTIAL DIAGNOSIS   URI, pneumonia, sepsis, UTI, gastroenteritis, dehydration, electrolyte abnormality  FINAL ASSESSMENT AND PLAN  Vomiting, UTI   Plan: The patient had presented for multiple complaints. Patient's labs were negative with the exception of UTI, urine culture was sent. Patient's imaging did not reveal any acute process.  She was given IV fluids as well as Zofran and IV Rocephin.  She will be discharged with Keflex and Zofran.  She is encouraged to have close outpatient follow-up with her doctor.   Laurence Aly, MD   Note: This note was generated in part or whole with voice recognition software. Voice recognition is usually quite accurate but there are transcription errors that can and very often do occur. I apologize for any typographical errors that were not detected and corrected.     Earleen Newport, MD 01/23/18 [phone]

## 2018-01-26 LAB — URINE CULTURE
Culture: >=100000 — AB
Special Requests: NORMAL

## 2018-02-02 ENCOUNTER — Ambulatory Visit (INDEPENDENT_AMBULATORY_CARE_PROVIDER_SITE_OTHER): Payer: Medicare HMO | Admitting: Nurse Practitioner

## 2018-02-02 ENCOUNTER — Encounter: Payer: Self-pay | Admitting: Nurse Practitioner

## 2018-02-02 VITALS — BP 136/90 | HR 96 | Temp 97.6°F | Resp 16 | Ht 67.0 in | Wt 137.4 lb

## 2018-02-02 DIAGNOSIS — J069 Acute upper respiratory infection, unspecified: Secondary | ICD-10-CM | POA: Diagnosis not present

## 2018-02-02 DIAGNOSIS — N390 Urinary tract infection, site not specified: Secondary | ICD-10-CM | POA: Diagnosis not present

## 2018-02-02 DIAGNOSIS — R300 Dysuria: Secondary | ICD-10-CM

## 2018-02-02 DIAGNOSIS — E1165 Type 2 diabetes mellitus with hyperglycemia: Secondary | ICD-10-CM | POA: Diagnosis not present

## 2018-02-02 DIAGNOSIS — M545 Low back pain, unspecified: Secondary | ICD-10-CM | POA: Insufficient documentation

## 2018-02-02 DIAGNOSIS — R110 Nausea: Secondary | ICD-10-CM

## 2018-02-02 DIAGNOSIS — R319 Hematuria, unspecified: Secondary | ICD-10-CM

## 2018-02-02 LAB — POCT URINALYSIS DIPSTICK
Bilirubin, UA: NEGATIVE
Blood, UA: NEGATIVE
GLUCOSE UA: NEGATIVE
KETONES UA: NEGATIVE
Nitrite, UA: POSITIVE
PROTEIN UA: POSITIVE — AB
Spec Grav, UA: 1.01 (ref 1.010–1.025)
Urobilinogen, UA: 0.2 E.U./dL
pH, UA: 5 (ref 5.0–8.0)

## 2018-02-02 LAB — POCT GLYCOSYLATED HEMOGLOBIN (HGB A1C): HEMOGLOBIN A1C: 8.8 % — AB (ref 4.0–5.6)

## 2018-02-02 MED ORDER — NITROFURANTOIN MONOHYD MACRO 100 MG PO CAPS: 100.0000 mg | ORAL_CAPSULE | Freq: Two times a day (BID) | ORAL | 0 refills | Status: DC

## 2018-02-02 MED ORDER — ONDANSETRON HCL 4 MG PO TABS: 4.0000 mg | ORAL_TABLET | Freq: Three times a day (TID) | ORAL | 0 refills | Status: DC | PRN

## 2018-02-02 MED ORDER — CEFUROXIME AXETIL 500 MG PO TABS: 500.0000 mg | ORAL_TABLET | Freq: Two times a day (BID) | ORAL | 0 refills | Status: DC

## 2018-02-02 MED ORDER — HYDROCODONE-ACETAMINOPHEN 5-325 MG PO TABS: 1.0000 | ORAL_TABLET | Freq: Four times a day (QID) | ORAL | 0 refills | Status: DC | PRN

## 2018-02-02 NOTE — Progress Notes (Signed)
[facility] [place], [place] [address]  Internal MEDICINE  Office Visit Note  Patient Name: Alexandra Yang  [id number]  [id number]  Date of Service: 02/02/2018  Chief Complaint  Patient presents with  . Diabetes  . Urinary Tract Infection  . URI    The patient was seen in ED on 01/23/2018. Treated for uti with cephalexin. Not feeling any better. Has severe back pain and nausea. Has burning pain when she urinates. Also has cough, congestion, and wheezing. Chest x-ray was negative for pneumonia or acute process in the lungs . Blood sugars running very high lately. Patient not taking lantus regularly since she has been sick. Admits she also hasn't taken victoza every day either.       Current Medication: Outpatient Encounter Medications as of 02/02/2018  Medication Sig  . albuterol (PROVENTIL) (2.5 MG/3ML) 0.083% nebulizer solution Take 2.5 mg by nebulization every 6 (six) hours as needed for wheezing or shortness of breath.  Marland Kitchen aspirin 81 MG chewable tablet Chew 81 mg by mouth every evening.   . benzonatate (TESSALON) 200 MG capsule Take 1 capsule (200 mg total) by mouth 3 (three) times daily as needed for cough.  . cephALEXin (KEFLEX) 500 MG capsule Take 1 capsule (500 mg total) by mouth 4 (four) times daily for 10 days.  . cholecalciferol (VITAMIN D) 1000 UNITS tablet Take 1,000 Units by mouth daily.  . ertapenem 1 g in sodium chloride 0.9 % 50 mL Inject 1 g into the vein daily.  . hydrOXYzine (ATARAX/VISTARIL) 10 MG tablet Take 1 tablet (10 mg total) by mouth 3 (three) times daily as needed for itching.  Marland Kitchen ibuprofen (ADVIL,MOTRIN) 800 MG tablet Take 1 tablet (800 mg total) by mouth 3 (three) times daily as needed.  . Insulin Pen Needle 32G X 4 MM MISC USE AS ONCE DAILY  . lisinopril (PRINIVIL,ZESTRIL) 10 MG tablet Take 1 tablet (10 mg total) by mouth daily.  . metFORMIN (GLUCOPHAGE-XR) 500 MG 24 hr tablet TAKE 1 TABLET EVERY NIGHT  . metoCLOPramide  (REGLAN) 10 MG tablet Take 1 tablet (10 mg total) by mouth daily as needed.  . Multiple Vitamins-Minerals (CENTRUM SILVER PO) Take 1 tablet by mouth daily.  . ondansetron (ZOFRAN ODT) 4 MG disintegrating tablet Take 1 tablet (4 mg total) by mouth every 8 (eight) hours as needed for nausea or vomiting.  . ondansetron (ZOFRAN) 4 MG tablet Take 1 tablet (4 mg total) by mouth every 8 (eight) hours as needed for nausea or vomiting.  . pregabalin (LYRICA) 75 MG capsule Take 75 mg by mouth at bedtime as needed (pain).   . simvastatin (ZOCOR) 40 MG tablet Take 1 tablet (40 mg total) by mouth at bedtime.  Marland Kitchen VICTOZA 18 MG/3ML SOPN Inject 1.8 mg  daily  . vitamin B-12 (CYANOCOBALAMIN) 1000 MCG tablet Take 1,000 mcg by mouth daily.   . [DISCONTINUED] ondansetron (ZOFRAN) 4 MG tablet Take 1 tablet (4 mg total) by mouth every 8 (eight) hours as needed for nausea or vomiting.  . cefUROXime (CEFTIN) 500 MG tablet Take 1 tablet (500 mg total) by mouth 2 (two) times daily with a meal.  . HYDROcodone-acetaminophen (NORCO/VICODIN) 5-325 MG tablet Take 1 tablet by mouth every 6 (six) hours as needed for moderate pain.  . Insulin Glargine (TOUJEO MAX SOLOSTAR) 300 UNIT/ML SOPN Inject 24 Units into the skin 1 day or 1 dose for 1 dose.  . levofloxacin (LEVAQUIN) 500 MG tablet Take 1 tablet (500 mg  total) by mouth daily. (Patient not taking: Reported on 02/02/2018)  . nitrofurantoin, macrocrystal-monohydrate, (MACROBID) 100 MG capsule Take 1 capsule (100 mg total) by mouth 2 (two) times daily.   No facility-administered encounter medications on file as of 02/02/2018.     Surgical History: Past Surgical History:  Procedure Laterality Date  . ABDOMINAL HYSTERECTOMY    . CHOLECYSTECTOMY    . KNEE ARTHROSCOPY Right   . TENDON TRANSFER Right 01/20/2015   Procedure: RIGHT HAND AND MIDDLE FINGER TENDON RECONSTRUCTION AND TRANSFER;  Surgeon: Roseanne Kaufman, MD;  Location: [place];  Service: Orthopedics;  Laterality: Right;     Medical History: Past Medical History:  Diagnosis Date  . Arthritis    osteoarthritis  . Cancer ([place]) 1995   endometrial ca  . COPD (chronic obstructive pulmonary disease) ([place])   . Diabetes mellitus without complication ([place])    type 2  . GERD (gastroesophageal reflux disease)   . History of kidney infection    had to have PICC line  . Neuropathy    both feet  . Osteopenia   . Osteoporosis   . Pneumonia     Family History: Family History  Problem Relation Age of Onset  . Pancreatic cancer Mother   . Cancer Mother        pancreaitis  . Lung cancer Father   . Breast cancer Sister 64    Social History   Socioeconomic History  . Marital status: Widowed    Spouse name: Not on file  . Number of children: Not on file  . Years of education: Not on file  . Highest education level: Not on file  Occupational History  . Not on file  Social Needs  . Financial resource strain: Not on file  . Food insecurity:    Worry: Not on file    Inability: Not on file  . Transportation needs:    Medical: Not on file    Non-medical: Not on file  Tobacco Use  . Smoking status: Current Every Day Smoker    Packs/day: 0.50    Years: 40.00    Pack years: 20.00    Types: Cigarettes  . Smokeless tobacco: Never Used  Substance and Sexual Activity  . Alcohol use: Yes    Comment: occasional  . Drug use: No  . Sexual activity: Not on file  Lifestyle  . Physical activity:    Days per week: Not on file    Minutes per session: Not on file  . Stress: Not on file  Relationships  . Social connections:    Talks on phone: Not on file    Gets together: Not on file    Attends religious service: Not on file    Active member of club or organization: Not on file    Attends meetings of clubs or organizations: Not on file    Relationship status: Not on file  . Intimate partner violence:    Fear of current or ex partner: Not on file    Emotionally abused: Not on file    Physically abused:  Not on file    Forced sexual activity: Not on file  Other Topics Concern  . Not on file  Social History Narrative  . Not on file      Review of Systems  Constitutional: Positive for activity change, chills and fatigue. Negative for diaphoresis and fever.  HENT: Positive for congestion, postnasal drip, sinus pressure and sinus pain. Negative for ear pain.   Eyes: Negative.  Negative for photophobia, discharge, redness, itching and visual disturbance.  Respiratory: Positive for cough, chest tightness, shortness of breath and wheezing.   Cardiovascular: Negative for chest pain, palpitations and leg swelling.  Gastrointestinal: Positive for nausea. Negative for abdominal pain, constipation, diarrhea and vomiting.  Endocrine: Negative for cold intolerance, heat intolerance, polydipsia, polyphagia and polyuria.       Blood sugars running high. Night sweats, intermittently    Genitourinary: Positive for dysuria, flank pain, frequency and urgency.  Musculoskeletal: Positive for arthralgias and myalgias. Negative for back pain, gait problem and neck pain.  Skin: Negative for color change.  Allergic/Immunologic: Positive for environmental allergies. Negative for food allergies.  Neurological: Positive for headaches. Negative for dizziness.  Hematological: Negative for adenopathy. Does not bruise/bleed easily.  Psychiatric/Behavioral: Negative for agitation, behavioral problems (depression) and hallucinations.    Today's Vitals   02/02/18 0902  BP: 136/90  Pulse: 96  Resp: 16  Temp: 97.6 F (36.4 C)  SpO2: 97%  Weight: 137 lb 6.4 oz (62.3 kg)  Height: 5\' 7"  (1.702 m)   Physical Exam  Constitutional: She is oriented to person, place, and time. She appears well-developed and well-nourished. No distress.  HENT:  Head: Normocephalic and atraumatic.  Nose: Rhinorrhea present.  Mouth/Throat: Oropharynx is clear and moist. No oropharyngeal exudate.  Eyes: Pupils are equal, round, and  reactive to light. Conjunctivae and EOM are normal.  Neck: Normal range of motion. Neck supple. No JVD present. No tracheal deviation present. No thyromegaly present.  Cardiovascular: Normal rate, regular rhythm and normal heart sounds. Exam reveals no gallop and no friction rub.  No murmur heard. Mild tachycardia   Pulmonary/Chest: Effort normal. No respiratory distress. She has wheezes. She has no rales. She exhibits no tenderness.  Congested, non-productive cough present.   Abdominal: Soft. Bowel sounds are normal. There is no tenderness.  Genitourinary:  Genitourinary Comments: U/a positive for protein, nitrates, and moderate WBSs.  Musculoskeletal: Normal range of motion.  Lymphadenopathy:    She has cervical adenopathy.  Neurological: She is alert and oriented to person, place, and time. No cranial nerve deficit.  Skin: Skin is warm and dry. She is not diaphoretic.  Psychiatric: She has a normal mood and affect. Her behavior is normal. Judgment and thought content normal.  Nursing note and vitals reviewed.  Assessment/Plan: 1. Uncontrolled type 2 diabetes mellitus with hyperglycemia (HCC) - POCT glycosylated hemoglobin (Hb A1C) 8.8 today. Patient not taking medications as prescribed due to illness. Discussed importance of taking diabetic medications, even when sick, to control blood sugars. She voiced understanding.   2. Urinary tract infection without hematuria, site unspecified Change keflex to macrobid 100mg  bid for 10 days. Will send for culture and sensitivity and adjust antibiotics as indicated - nitrofurantoin, macrocrystal-monohydrate, (MACROBID) 100 MG capsule; Take 1 capsule (100 mg total) by mouth 2 (two) times daily.  Dispense: 20 capsule; Refill: 0  3. Acute low back pain without sciatica, unspecified back pain laterality Likely due to significant uti. Will treat pain with short term RX for hydrocodone/APAP 5/325mg . May be taken up to four times daily. Advised her to  take with caution and only as needed due to risk of side effects. She will take with caution.  - HYDROcodone-acetaminophen (NORCO/VICODIN) 5-325 MG tablet; Take 1 tablet by mouth every 6 (six) hours as needed for moderate pain.  Dispense: 30 tablet; Refill: 0  4. Acute upper respiratory infection Change keflex to ceftin 500mg  twice daily for 10 days. Chest x-ray 01/23/2018 negative  for acute process.  - cefUROXime (CEFTIN) 500 MG tablet; Take 1 tablet (500 mg total) by mouth 2 (two) times daily with a meal.  Dispense: 20 tablet; Refill: 0  5. Nausea - ondansetron (ZOFRAN) 4 MG tablet; Take 1 tablet (4 mg total) by mouth every 8 (eight) hours as needed for nausea or vomiting.  Dispense: 30 tablet; Refill: 0  6. Dysuria - POCT Urinalysis Dipstick - CULTURE, URINE COMPREHENSIVE  General Counseling: Ruvi verbalizes understanding of the findings of todays visit and agrees with plan of treatment. I have discussed any further diagnostic evaluation that may be needed or ordered today. We also reviewed her medications today. she has been encouraged to call the office with any questions or concerns that should arise related to todays visit.  Reviewed risks and possible side effects associated with taking opiates, benzodiazepines and other CNS depressants. Combination of these could cause dizziness and drowsiness. Advised patient not to drive or operate machinery when taking these medications, as patient's and other's life can be at risk and will have consequences. Patient verbalized understanding in this matter. Dependence and abuse for these drugs will be monitored closely. A Controlled substance policy and procedure is on file which allows [facility] to order a urine drug screen test at any visit. Patient understands and agrees with the plan  Rest and increase fluids. Continue using OTC medication to control symptoms.   Orders Placed This Encounter  Procedures  . CULTURE, URINE  COMPREHENSIVE  . POCT glycosylated hemoglobin (Hb A1C)  . POCT Urinalysis Dipstick    Meds ordered this encounter  Medications  . nitrofurantoin, macrocrystal-monohydrate, (MACROBID) 100 MG capsule    Sig: Take 1 capsule (100 mg total) by mouth 2 (two) times daily.    Dispense:  20 capsule    Refill:  0    Order Specific Question:   Supervising Provider    Answer:   Lavera Guise [[id number]]  . cefUROXime (CEFTIN) 500 MG tablet    Sig: Take 1 tablet (500 mg total) by mouth 2 (two) times daily with a meal.    Dispense:  20 tablet    Refill:  0    Order Specific Question:   Supervising Provider    Answer:   Lavera Guise [place]  . HYDROcodone-acetaminophen (NORCO/VICODIN) 5-325 MG tablet    Sig: Take 1 tablet by mouth every 6 (six) hours as needed for moderate pain.    Dispense:  30 tablet    Refill:  0    Order Specific Question:   Supervising Provider    Answer:   Lavera Guise [[id number]]  . ondansetron (ZOFRAN) 4 MG tablet    Sig: Take 1 tablet (4 mg total) by mouth every 8 (eight) hours as needed for nausea or vomiting.    Dispense:  30 tablet    Refill:  0    Order Specific Question:   Supervising Provider    Answer:   Lavera Guise [[id number]]    Time spent: 6 Minutes      Dr Lavera Guise Internal medicine

## 2018-02-05 LAB — CULTURE, URINE COMPREHENSIVE

## 2018-02-17 ENCOUNTER — Ambulatory Visit (INDEPENDENT_AMBULATORY_CARE_PROVIDER_SITE_OTHER): Payer: Medicare HMO | Admitting: Nurse Practitioner

## 2018-02-17 ENCOUNTER — Encounter: Payer: Self-pay | Admitting: Nurse Practitioner

## 2018-02-17 VITALS — BP 140/80 | HR 92 | Resp 16 | Ht 67.0 in | Wt 137.0 lb

## 2018-02-17 DIAGNOSIS — R300 Dysuria: Secondary | ICD-10-CM

## 2018-02-17 DIAGNOSIS — J069 Acute upper respiratory infection, unspecified: Secondary | ICD-10-CM | POA: Diagnosis not present

## 2018-02-17 DIAGNOSIS — E1165 Type 2 diabetes mellitus with hyperglycemia: Secondary | ICD-10-CM | POA: Diagnosis not present

## 2018-02-17 DIAGNOSIS — N390 Urinary tract infection, site not specified: Secondary | ICD-10-CM

## 2018-02-17 DIAGNOSIS — R319 Hematuria, unspecified: Secondary | ICD-10-CM | POA: Diagnosis not present

## 2018-02-17 LAB — POCT URINALYSIS DIPSTICK
GLUCOSE UA: NEGATIVE
NITRITE UA: NEGATIVE
PROTEIN UA: POSITIVE — AB
Spec Grav, UA: 1.015 (ref 1.010–1.025)
Urobilinogen, UA: 0.2 E.U./dL
pH, UA: 5 (ref 5.0–8.0)

## 2018-02-17 MED ORDER — SEMAGLUTIDE(0.25 OR 0.5MG/DOS) 2 MG/1.5ML ~~LOC~~ SOPN: 0.5000 mg | PEN_INJECTOR | SUBCUTANEOUS | 5 refills | Status: DC

## 2018-02-17 MED ORDER — CEFUROXIME AXETIL 500 MG PO TABS: 500.0000 mg | ORAL_TABLET | Freq: Two times a day (BID) | ORAL | 0 refills | Status: DC

## 2018-02-17 NOTE — Progress Notes (Signed)
[facility] [place], [place] [address]  Internal MEDICINE  Office Visit Note  Patient Name: Alexandra Yang  [id number]  [id number]  Date of Service: 02/17/2018  Chief Complaint  Patient presents with  . URI    The patient was seen for significant UTI and URI. Was treated with round of macrobid and ceftin. Feels much better. Does have congested, non-productive cough and sinus drainage. U/A today continues to show moderate WBC with trace of blood and trace protein.  Blood sugars still running elevated. Discussed changing from victoza to Ozempic to help control blood sugars better.   URI   This is a recurrent problem. The current episode started more than 1 month ago. The problem has been gradually improving. There has been no fever. Associated symptoms include congestion, coughing, headaches, nausea and wheezing. Pertinent negatives include no abdominal pain, chest pain, diarrhea, dysuria, ear pain, neck pain, sinus pain or vomiting. Treatments tried: antibiotics  The treatment provided moderate relief.       Current Medication: Outpatient Encounter Medications as of 02/17/2018  Medication Sig Note  . albuterol (PROVENTIL) (2.5 MG/3ML) 0.083% nebulizer solution Take 2.5 mg by nebulization every 6 (six) hours as needed for wheezing or shortness of breath.   Marland Kitchen aspirin 81 MG chewable tablet Chew 81 mg by mouth every evening.    . benzonatate (TESSALON) 200 MG capsule Take 1 capsule (200 mg total) by mouth 3 (three) times daily as needed for cough.   . cholecalciferol (VITAMIN D) 1000 UNITS tablet Take 1,000 Units by mouth daily.   . ertapenem 1 g in sodium chloride 0.9 % 50 mL Inject 1 g into the vein daily.   Marland Kitchen HYDROcodone-acetaminophen (NORCO/VICODIN) 5-325 MG tablet Take 1 tablet by mouth every 6 (six) hours as needed for moderate pain.   . hydrOXYzine (ATARAX/VISTARIL) 10 MG tablet Take 1 tablet (10 mg total) by mouth 3 (three) times daily as needed for  itching.   Marland Kitchen ibuprofen (ADVIL,MOTRIN) 800 MG tablet Take 1 tablet (800 mg total) by mouth 3 (three) times daily as needed.   . Insulin Pen Needle 32G X 4 MM MISC USE AS ONCE DAILY   . lisinopril (PRINIVIL,ZESTRIL) 10 MG tablet Take 1 tablet (10 mg total) by mouth daily.   . metFORMIN (GLUCOPHAGE-XR) 500 MG 24 hr tablet TAKE 1 TABLET EVERY NIGHT   . metoCLOPramide (REGLAN) 10 MG tablet Take 1 tablet (10 mg total) by mouth daily as needed.   . Multiple Vitamins-Minerals (CENTRUM SILVER PO) Take 1 tablet by mouth daily.   . ondansetron (ZOFRAN ODT) 4 MG disintegrating tablet Take 1 tablet (4 mg total) by mouth every 8 (eight) hours as needed for nausea or vomiting.   . ondansetron (ZOFRAN) 4 MG tablet Take 1 tablet (4 mg total) by mouth every 8 (eight) hours as needed for nausea or vomiting.   . pregabalin (LYRICA) 75 MG capsule Take 75 mg by mouth at bedtime as needed (pain).    . simvastatin (ZOCOR) 40 MG tablet Take 1 tablet (40 mg total) by mouth at bedtime.   . vitamin B-12 (CYANOCOBALAMIN) 1000 MCG tablet Take 1,000 mcg by mouth daily.    . [DISCONTINUED] nitrofurantoin, macrocrystal-monohydrate, (MACROBID) 100 MG capsule Take 1 capsule (100 mg total) by mouth 2 (two) times daily.   . [DISCONTINUED] VICTOZA 18 MG/3ML SOPN Inject 1.8 mg  daily 02/17/2018: change to ozempic  . cefUROXime (CEFTIN) 500 MG tablet Take 1 tablet (500 mg total) by mouth  2 (two) times daily with a meal.   . Insulin Glargine (TOUJEO MAX SOLOSTAR) 300 UNIT/ML SOPN Inject 24 Units into the skin 1 day or 1 dose for 1 dose.   . Semaglutide,0.25 or 0.5MG /DOS, (OZEMPIC, 0.25 OR 0.5 MG/DOSE,) 2 MG/1.5ML SOPN Inject 0.5 mg into the skin once a week.   . [DISCONTINUED] cefUROXime (CEFTIN) 500 MG tablet Take 1 tablet (500 mg total) by mouth 2 (two) times daily with a meal. (Patient not taking: Reported on 02/17/2018)   . [DISCONTINUED] levofloxacin (LEVAQUIN) 500 MG tablet Take 1 tablet (500 mg total) by mouth daily. (Patient not  taking: Reported on 02/17/2018)    No facility-administered encounter medications on file as of 02/17/2018.     Surgical History: Past Surgical History:  Procedure Laterality Date  . ABDOMINAL HYSTERECTOMY    . CHOLECYSTECTOMY    . KNEE ARTHROSCOPY Right   . TENDON TRANSFER Right 01/20/2015   Procedure: RIGHT HAND AND MIDDLE FINGER TENDON RECONSTRUCTION AND TRANSFER;  Surgeon: Roseanne Kaufman, MD;  Location: [facility];  Service: Orthopedics;  Laterality: Right;    Medical History: Past Medical History:  Diagnosis Date  . Arthritis    osteoarthritis  . Cancer ([place]) 1995   endometrial ca  . COPD (chronic obstructive pulmonary disease) ([place])   . Diabetes mellitus without complication ([place])    type 2  . GERD (gastroesophageal reflux disease)   . History of kidney infection    had to have PICC line  . Neuropathy    both feet  . Osteopenia   . Osteoporosis   . Pneumonia     Family History: Family History  Problem Relation Age of Onset  . Pancreatic cancer Mother   . Cancer Mother        pancreaitis  . Lung cancer Father   . Breast cancer Sister 82    Social History   Socioeconomic History  . Marital status: Widowed    Spouse name: Not on file  . Number of children: Not on file  . Years of education: Not on file  . Highest education level: Not on file  Occupational History  . Not on file  Social Needs  . Financial resource strain: Not on file  . Food insecurity:    Worry: Not on file    Inability: Not on file  . Transportation needs:    Medical: Not on file    Non-medical: Not on file  Tobacco Use  . Smoking status: Current Every Day Smoker    Packs/day: 0.50    Years: 40.00    Pack years: 20.00    Types: Cigarettes  . Smokeless tobacco: Never Used  Substance and Sexual Activity  . Alcohol use: Yes    Comment: occasional  . Drug use: No  . Sexual activity: Not on file  Lifestyle  . Physical activity:    Days per week: Not on file    Minutes per session:  Not on file  . Stress: Not on file  Relationships  . Social connections:    Talks on phone: Not on file    Gets together: Not on file    Attends religious service: Not on file    Active member of club or organization: Not on file    Attends meetings of clubs or organizations: Not on file    Relationship status: Not on file  . Intimate partner violence:    Fear of current or ex partner: Not on file    Emotionally abused:  Not on file    Physically abused: Not on file    Forced sexual activity: Not on file  Other Topics Concern  . Not on file  Social History Narrative  . Not on file      Review of Systems  Constitutional: Positive for fatigue. Negative for activity change, chills, diaphoresis and fever.  HENT: Positive for congestion and postnasal drip. Negative for ear pain, sinus pressure and sinus pain.   Eyes: Negative.  Negative for photophobia, discharge, redness, itching and visual disturbance.  Respiratory: Positive for cough and wheezing. Negative for chest tightness and shortness of breath.   Cardiovascular: Negative for chest pain, palpitations and leg swelling.  Gastrointestinal: Positive for nausea. Negative for abdominal pain, constipation, diarrhea and vomiting.  Endocrine: Negative for cold intolerance, heat intolerance, polydipsia, polyphagia and polyuria.       Blood sugars running high. Night sweats, intermittently    Genitourinary: Positive for urgency. Negative for dysuria, flank pain and frequency.  Musculoskeletal: Positive for arthralgias and myalgias. Negative for back pain, gait problem and neck pain.  Skin: Negative for color change.  Allergic/Immunologic: Positive for environmental allergies. Negative for food allergies.  Neurological: Positive for headaches. Negative for dizziness.  Hematological: Negative for adenopathy. Does not bruise/bleed easily.  Psychiatric/Behavioral: Negative for agitation, behavioral problems (depression) and hallucinations.     Vital Signs: BP 140/80   Pulse 92   Resp 16   Ht 5\' 7"  (1.702 m)   Wt 137 lb (62.1 kg)   SpO2 96%   BMI 21.46 kg/m    Physical Exam  Constitutional: She is oriented to person, place, and time. She appears well-developed and well-nourished. No distress.  HENT:  Head: Normocephalic and atraumatic.  Nose: Rhinorrhea present.  Mouth/Throat: Oropharynx is clear and moist. No oropharyngeal exudate.  Eyes: Pupils are equal, round, and reactive to light. Conjunctivae and EOM are normal.  Neck: Normal range of motion. Neck supple. No JVD present. No tracheal deviation present. No thyromegaly present.  Cardiovascular: Normal rate, regular rhythm and normal heart sounds. Exam reveals no gallop and no friction rub.  No murmur heard. Mild tachycardia   Pulmonary/Chest: Effort normal and breath sounds normal. No respiratory distress. She has no wheezes. She has no rales. She exhibits no tenderness.  Congested, non-productive cough present.   Abdominal: Soft. Bowel sounds are normal. There is no tenderness.  Genitourinary:  Genitourinary Comments: U/A positive for moderate WBC, trace blood and trace protein.   Musculoskeletal: Normal range of motion.  Lymphadenopathy:    She has no cervical adenopathy.  Neurological: She is alert and oriented to person, place, and time. No cranial nerve deficit.  Skin: Skin is warm and dry. She is not diaphoretic.  Psychiatric: She has a normal mood and affect. Her behavior is normal. Judgment and thought content normal.  Nursing note and vitals reviewed.  Assessment/Plan: 1. Acute upper respiratory infection Improved, but not resolved. Will repeat ceftin 500mg  bid for 10 days. Samples of mucinex were given, which may be used twice daily to relieve cough and congestion.  - cefUROXime (CEFTIN) 500 MG tablet; Take 1 tablet (500 mg total) by mouth 2 (two) times daily with a meal.  Dispense: 20 tablet; Refill: 0  2. Urinary tract infection with hematuria,  site unspecified Treat with deftin 500mg  bid. Send for culture and sensitivity and adjust abx as indicated.  - CULTURE, URINE COMPREHENSIVE  3. Uncontrolled type 2 diabetes mellitus with hyperglycemia ([place]) D/c victoza. Change to Ozempic 0.5mg  weekly.  Continue Toujeo as prescribed.  - Semaglutide,0.25 or 0.5MG /DOS, (OZEMPIC, 0.25 OR 0.5 MG/DOSE,) 2 MG/1.5ML SOPN; Inject 0.5 mg into the skin once a week.  Dispense: 1 pen; Refill: 5  4. Dysuria - POCT Urinalysis Dipstick  General Counseling: Sandy Salaam understanding of the findings of todays visit and agrees with plan of treatment. I have discussed any further diagnostic evaluation that may be needed or ordered today. We also reviewed her medications today. she has been encouraged to call the office with any questions or concerns that should arise related to todays visit.  Diabetes Counseling:  1. Addition of ACE inh/ ARB'S for nephroprotection. Microalbumin is updated  2. Diabetic foot care, prevention of complications. Podiatry consult 3. Exercise and lose weight.  4. Diabetic eye examination, Diabetic eye exam is updated  5. Monitor blood sugar closlely. nutrition counseling.  6. Sign and symptoms of hypoglycemia including shaking sweating,confusion and headaches.   This patient was seen by Leretha Pol FNP Collaboration with Dr Lavera Guise as a part of collaborative care agreement   Orders Placed This Encounter  Procedures  . CULTURE, URINE COMPREHENSIVE  . POCT Urinalysis Dipstick    Meds ordered this encounter  Medications  . cefUROXime (CEFTIN) 500 MG tablet    Sig: Take 1 tablet (500 mg total) by mouth 2 (two) times daily with a meal.    Dispense:  20 tablet    Refill:  0    Order Specific Question:   Supervising Provider    Answer:   Lavera Guise [place]  . Semaglutide,0.25 or 0.5MG /DOS, (OZEMPIC, 0.25 OR 0.5 MG/DOSE,) 2 MG/1.5ML SOPN    Sig: Inject 0.5 mg into the skin once a week.    Dispense:  1 pen     Refill:  5    Please d/c victoza. A sample of ozempic was given today.    Order Specific Question:   Supervising Provider    Answer:   Lavera Guise [[id number]]    Time spent: 55 Minutes      Dr Lavera Guise Internal medicine

## 2018-02-20 LAB — CULTURE, URINE COMPREHENSIVE

## 2018-04-06 ENCOUNTER — Other Ambulatory Visit: Payer: Self-pay

## 2018-04-06 MED ORDER — METFORMIN HCL ER 500 MG PO TB24: ORAL_TABLET | ORAL | 1 refills | Status: DC

## 2018-04-08 ENCOUNTER — Other Ambulatory Visit: Payer: Self-pay

## 2018-04-08 MED ORDER — METFORMIN HCL ER 500 MG PO TB24: ORAL_TABLET | ORAL | 1 refills | Status: DC

## 2018-04-19 ENCOUNTER — Other Ambulatory Visit: Payer: Self-pay

## 2018-04-19 MED ORDER — METFORMIN HCL ER 500 MG PO TB24: ORAL_TABLET | ORAL | 1 refills | Status: DC

## 2018-04-29 ENCOUNTER — Other Ambulatory Visit: Payer: Self-pay

## 2018-04-29 MED ORDER — IBUPROFEN 800 MG PO TABS: 800.0000 mg | ORAL_TABLET | Freq: Three times a day (TID) | ORAL | 1 refills | Status: DC | PRN

## 2018-05-05 ENCOUNTER — Telehealth: Payer: Self-pay

## 2018-05-05 NOTE — Telephone Encounter (Signed)
Pt called for samples of Victoza or Ozempic, informed the pt that I will have 1 sample of Victoza waiting for her pt stated she will be by to pick it up as soon as she can.

## 2018-05-20 ENCOUNTER — Encounter: Payer: Self-pay | Admitting: Nurse Practitioner

## 2018-05-20 ENCOUNTER — Ambulatory Visit (INDEPENDENT_AMBULATORY_CARE_PROVIDER_SITE_OTHER): Payer: Medicare HMO | Admitting: Nurse Practitioner

## 2018-05-20 VITALS — BP 128/74 | HR 96 | Resp 16 | Ht 67.0 in | Wt 135.6 lb

## 2018-05-20 DIAGNOSIS — I10 Essential (primary) hypertension: Secondary | ICD-10-CM | POA: Diagnosis not present

## 2018-05-20 DIAGNOSIS — E1142 Type 2 diabetes mellitus with diabetic polyneuropathy: Secondary | ICD-10-CM

## 2018-05-20 DIAGNOSIS — E782 Mixed hyperlipidemia: Secondary | ICD-10-CM

## 2018-05-20 LAB — POCT GLYCOSYLATED HEMOGLOBIN (HGB A1C): Hemoglobin A1C: 8.2 % — AB (ref 4.0–5.6)

## 2018-05-20 MED ORDER — SEMAGLUTIDE(0.25 OR 0.5MG/DOS) 2 MG/1.5ML ~~LOC~~ SOPN: 0.5000 mg | PEN_INJECTOR | SUBCUTANEOUS | 5 refills | Status: DC

## 2018-05-20 MED ORDER — GLUCOSE BLOOD VI STRP: ORAL_STRIP | 12 refills | Status: DC

## 2018-05-20 NOTE — Progress Notes (Signed)
[facility] [place], [place] [address]  Internal MEDICINE  Office Visit Note  Patient Name: Alexandra Yang  [id number]  [id number]  Date of Service: 05/20/2018  Chief Complaint  Patient presents with  . Medical Management of Chronic Issues    3 month follow up  . Diabetes    The patient is here for routine follow up of diabetes. Was recently started on Ozempic 0.5mg  weekly. Also taking toujeo at 10 units. Advised she increase this to up to 24 units but she has not increased it past that. HgbA1c 8.2 today, down from 8.8 at most recent check. Does report feeling cold frequently. Checked CBC in 01/2017 and this was normal.       Current Medication: Outpatient Encounter Medications as of 05/20/2018  Medication Sig  . albuterol (PROVENTIL) (2.5 MG/3ML) 0.083% nebulizer solution Take 2.5 mg by nebulization every 6 (six) hours as needed for wheezing or shortness of breath.  Marland Kitchen aspirin 81 MG chewable tablet Chew 81 mg by mouth every evening.   . benzonatate (TESSALON) 200 MG capsule Take 1 capsule (200 mg total) by mouth 3 (three) times daily as needed for cough.  . cholecalciferol (VITAMIN D) 1000 UNITS tablet Take 1,000 Units by mouth daily.  Marland Kitchen HYDROcodone-acetaminophen (NORCO/VICODIN) 5-325 MG tablet Take 1 tablet by mouth every 6 (six) hours as needed for moderate pain.  . hydrOXYzine (ATARAX/VISTARIL) 10 MG tablet Take 1 tablet (10 mg total) by mouth 3 (three) times daily as needed for itching.  Marland Kitchen ibuprofen (ADVIL,MOTRIN) 800 MG tablet Take 1 tablet (800 mg total) by mouth 3 (three) times daily as needed.  . Insulin Pen Needle 32G X 4 MM MISC USE AS ONCE DAILY  . lisinopril (PRINIVIL,ZESTRIL) 10 MG tablet Take 1 tablet (10 mg total) by mouth daily.  . metFORMIN (GLUCOPHAGE-XR) 500 MG 24 hr tablet TAKE 1 TABLET EVERY NIGHT  . metoCLOPramide (REGLAN) 10 MG tablet Take 1 tablet (10 mg total) by mouth daily as needed.  . Multiple Vitamins-Minerals (CENTRUM SILVER  PO) Take 1 tablet by mouth daily.  . ondansetron (ZOFRAN ODT) 4 MG disintegrating tablet Take 1 tablet (4 mg total) by mouth every 8 (eight) hours as needed for nausea or vomiting.  . ondansetron (ZOFRAN) 4 MG tablet Take 1 tablet (4 mg total) by mouth every 8 (eight) hours as needed for nausea or vomiting.  . pregabalin (LYRICA) 75 MG capsule Take 75 mg by mouth at bedtime as needed (pain).   . Semaglutide,0.25 or 0.5MG /DOS, (OZEMPIC, 0.25 OR 0.5 MG/DOSE,) 2 MG/1.5ML SOPN Inject 0.5 mg into the skin once a week.  . simvastatin (ZOCOR) 40 MG tablet Take 1 tablet (40 mg total) by mouth at bedtime.  . vitamin B-12 (CYANOCOBALAMIN) 1000 MCG tablet Take 1,000 mcg by mouth daily.   . [DISCONTINUED] Semaglutide,0.25 or 0.5MG /DOS, (OZEMPIC, 0.25 OR 0.5 MG/DOSE,) 2 MG/1.5ML SOPN Inject 0.5 mg into the skin once a week.  . cefUROXime (CEFTIN) 500 MG tablet Take 1 tablet (500 mg total) by mouth 2 (two) times daily with a meal.  . ertapenem 1 g in sodium chloride 0.9 % 50 mL Inject 1 g into the vein daily. (Patient not taking: Reported on 05/20/2018)  . Insulin Glargine (TOUJEO MAX SOLOSTAR) 300 UNIT/ML SOPN Inject 24 Units into the skin 1 day or 1 dose for 1 dose.   No facility-administered encounter medications on file as of 05/20/2018.     Surgical History: Past Surgical History:  Procedure Laterality Date  .  ABDOMINAL HYSTERECTOMY    . CHOLECYSTECTOMY    . KNEE ARTHROSCOPY Right   . TENDON TRANSFER Right 01/20/2015   Procedure: RIGHT HAND AND MIDDLE FINGER TENDON RECONSTRUCTION AND TRANSFER;  Surgeon: Roseanne Kaufman, MD;  Location: [facility];  Service: Orthopedics;  Laterality: Right;    Medical History: Past Medical History:  Diagnosis Date  . Arthritis    osteoarthritis  . Cancer ([place]) 1995   endometrial ca  . COPD (chronic obstructive pulmonary disease) ([place])   . Diabetes mellitus without complication ([place])    type 2  . GERD (gastroesophageal reflux disease)   . History of kidney infection     had to have PICC line  . Neuropathy    both feet  . Osteopenia   . Osteoporosis   . Pneumonia     Family History: Family History  Problem Relation Age of Onset  . Pancreatic cancer Mother   . Cancer Mother        pancreaitis  . Lung cancer Father   . Breast cancer Sister 41    Social History   Socioeconomic History  . Marital status: Widowed    Spouse name: Not on file  . Number of children: Not on file  . Years of education: Not on file  . Highest education level: Not on file  Occupational History  . Not on file  Social Needs  . Financial resource strain: Not on file  . Food insecurity:    Worry: Not on file    Inability: Not on file  . Transportation needs:    Medical: Not on file    Non-medical: Not on file  Tobacco Use  . Smoking status: Current Every Day Smoker    Packs/day: 0.50    Years: 40.00    Pack years: 20.00    Types: Cigarettes  . Smokeless tobacco: Never Used  Substance and Sexual Activity  . Alcohol use: Yes    Comment: occasional  . Drug use: No  . Sexual activity: Not on file  Lifestyle  . Physical activity:    Days per week: Not on file    Minutes per session: Not on file  . Stress: Not on file  Relationships  . Social connections:    Talks on phone: Not on file    Gets together: Not on file    Attends religious service: Not on file    Active member of club or organization: Not on file    Attends meetings of clubs or organizations: Not on file    Relationship status: Not on file  . Intimate partner violence:    Fear of current or ex partner: Not on file    Emotionally abused: Not on file    Physically abused: Not on file    Forced sexual activity: Not on file  Other Topics Concern  . Not on file  Social History Narrative  . Not on file      Review of Systems  Constitutional: Negative for activity change, chills, diaphoresis, fatigue and fever.  HENT: Negative for congestion, ear pain, postnasal drip, sinus pressure and  sinus pain.   Respiratory: Negative for cough, chest tightness, shortness of breath and wheezing.   Cardiovascular: Negative for chest pain and palpitations.  Gastrointestinal: Negative for abdominal pain, constipation, diarrhea, nausea and vomiting.  Endocrine: Positive for cold intolerance. Negative for heat intolerance, polydipsia, polyphagia and polyuria.       Improving blood sugars.    Musculoskeletal: Positive for arthralgias and myalgias.  Negative for back pain, gait problem and neck pain.  Skin: Negative for color change.  Allergic/Immunologic: Positive for environmental allergies. Negative for food allergies.  Neurological: Positive for headaches. Negative for dizziness.  Hematological: Negative for adenopathy. Does not bruise/bleed easily.  Psychiatric/Behavioral: Negative for agitation, behavioral problems (depression) and hallucinations.   Today's Vitals   05/20/18 0925  BP: 128/74  Pulse: 96  Resp: 16  SpO2: 97%  Weight: 135 lb 9.6 oz (61.5 kg)  Height: 5\' 7"  (1.702 m)    Physical Exam Vitals signs and nursing note reviewed.  Constitutional:      General: She is not in acute distress.    Appearance: Normal appearance. She is well-developed. She is not diaphoretic.  HENT:     Head: Normocephalic and atraumatic.     Nose: No rhinorrhea.     Mouth/Throat:     Pharynx: No oropharyngeal exudate.  Eyes:     Conjunctiva/sclera: Conjunctivae normal.     Pupils: Pupils are equal, round, and reactive to light.  Neck:     Musculoskeletal: Normal range of motion and neck supple.     Thyroid: No thyromegaly.     Vascular: No JVD.     Trachea: No tracheal deviation.  Cardiovascular:     Rate and Rhythm: Normal rate and regular rhythm.     Heart sounds: Normal heart sounds. No murmur. No friction rub. No gallop.   Pulmonary:     Effort: Pulmonary effort is normal. No respiratory distress.     Breath sounds: Normal breath sounds. No wheezing or rales.  Chest:     Chest  wall: No tenderness.  Abdominal:     General: Bowel sounds are normal.     Palpations: Abdomen is soft.     Tenderness: There is no abdominal tenderness.  Genitourinary:    Comments: U/A positive for moderate WBC, trace blood and trace protein.  Musculoskeletal: Normal range of motion.  Lymphadenopathy:     Cervical: No cervical adenopathy.  Skin:    General: Skin is warm and dry.  Neurological:     Mental Status: She is alert and oriented to person, place, and time.     Cranial Nerves: No cranial nerve deficit.  Psychiatric:        Behavior: Behavior normal.        Thought Content: Thought content normal.        Judgment: Judgment normal.    Assessment/Plan: 1. Type 2 diabetes mellitus with peripheral neuropathy ([facility]) - POCT HgB A1C 8.2 today, down from 8.8 today. Continue ozempic 0.5mg  weekly. Continue toujeo 10 units daily. Increase dose to 14 units second half of the week to compensate for elevated sugars. Continue lyrica twice daily. New prescription for diabetic shoes given to patient. New glucometer was given to the patient today.  - Semaglutide,0.25 or 0.5MG /DOS, (OZEMPIC, 0.25 OR 0.5 MG/DOSE,) 2 MG/1.5ML SOPN; Inject 0.5 mg into the skin once a week.  Dispense: 1 pen; Refill: 5  2. Essential hypertension Stable. Continue bp medication as prescribed .  3. Mixed hyperlipidemia Continue simvastatin as prescribed .  General Counseling: zayleigh stroh understanding of the findings of todays visit and agrees with plan of treatment. I have discussed any further diagnostic evaluation that may be needed or ordered today. We also reviewed her medications today. she has been encouraged to call the office with any questions or concerns that should arise related to todays visit.  Diabetes Counseling:  1. Addition of ACE inh/ ARB'S for nephroprotection.  Microalbumin is updated  2. Diabetic foot care, prevention of complications. Podiatry consult 3. Exercise and lose weight.  4.  Diabetic eye examination, Diabetic eye exam is updated  5. Monitor blood sugar closlely. nutrition counseling.  6. Sign and symptoms of hypoglycemia including shaking sweating,confusion and headaches.  This patient was seen by Leretha Pol FNP Collaboration with Dr Lavera Guise as a part of collaborative care agreement  Orders Placed This Encounter  Procedures  . POCT HgB A1C    Meds ordered this encounter  Medications  . Semaglutide,0.25 or 0.5MG /DOS, (OZEMPIC, 0.25 OR 0.5 MG/DOSE,) 2 MG/1.5ML SOPN    Sig: Inject 0.5 mg into the skin once a week.    Dispense:  1 pen    Refill:  5    Please d/c victoza. A sample of ozempic was given today.    Order Specific Question:   Supervising Provider    Answer:   Lavera Guise [[id number]]    Time spent: 67 Minutes      Dr Lavera Guise Internal medicine

## 2018-06-20 ENCOUNTER — Emergency Department
Admission: EM | Admit: 2018-06-20 | Discharge: 2018-06-20 | Disposition: A | Discharge status: Home / Self Care | Payer: Medicare HMO | Attending: Student in an Organized Health Care Education/Training Program | Admitting: Student in an Organized Health Care Education/Training Program

## 2018-06-20 ENCOUNTER — Emergency Department: Payer: Medicare HMO

## 2018-06-20 ENCOUNTER — Other Ambulatory Visit: Payer: Self-pay

## 2018-06-20 DIAGNOSIS — Z79899 Other long term (current) drug therapy: Secondary | ICD-10-CM | POA: Diagnosis not present

## 2018-06-20 DIAGNOSIS — I10 Essential (primary) hypertension: Secondary | ICD-10-CM | POA: Insufficient documentation

## 2018-06-20 DIAGNOSIS — M542 Cervicalgia: Secondary | ICD-10-CM

## 2018-06-20 DIAGNOSIS — F17210 Nicotine dependence, cigarettes, uncomplicated: Secondary | ICD-10-CM | POA: Insufficient documentation

## 2018-06-20 DIAGNOSIS — R42 Dizziness and giddiness: Secondary | ICD-10-CM | POA: Insufficient documentation

## 2018-06-20 DIAGNOSIS — Z794 Long term (current) use of insulin: Secondary | ICD-10-CM | POA: Insufficient documentation

## 2018-06-20 DIAGNOSIS — Z7982 Long term (current) use of aspirin: Secondary | ICD-10-CM | POA: Diagnosis not present

## 2018-06-20 DIAGNOSIS — J449 Chronic obstructive pulmonary disease, unspecified: Secondary | ICD-10-CM | POA: Diagnosis not present

## 2018-06-20 DIAGNOSIS — M436 Torticollis: Secondary | ICD-10-CM | POA: Insufficient documentation

## 2018-06-20 DIAGNOSIS — E119 Type 2 diabetes mellitus without complications: Secondary | ICD-10-CM | POA: Insufficient documentation

## 2018-06-20 LAB — CBC
HCT: 37.8 % (ref 36.0–46.0)
Hemoglobin: 12.4 g/dL (ref 12.0–15.0)
MCH: 30.3 pg (ref 26.0–34.0)
MCHC: 32.8 g/dL (ref 30.0–36.0)
MCV: 92.4 fL (ref 80.0–100.0)
Platelets: 279 10*3/uL (ref 150–400)
RBC: 4.09 MIL/uL (ref 3.87–5.11)
RDW: 12.3 % (ref 11.5–15.5)
WBC: 12.7 10*3/uL — ABNORMAL HIGH (ref 4.0–10.5)
nRBC: 0 % (ref 0.0–0.2)

## 2018-06-20 LAB — BASIC METABOLIC PANEL
Anion gap: 5 (ref 5–15)
BUN: 27 mg/dL — ABNORMAL HIGH (ref 8–23)
CO2: 26 mmol/L (ref 22–32)
Calcium: 9.8 mg/dL (ref 8.9–10.3)
Chloride: 106 mmol/L (ref 98–111)
Creatinine, Ser: 0.53 mg/dL (ref 0.44–1.00)
GFR calc Af Amer: >60 mL/min (ref >60)
GFR calc non Af Amer: >60 mL/min (ref >60)
Glucose, Bld: 159 mg/dL — ABNORMAL HIGH (ref 70–99)
Potassium: 4.5 mmol/L (ref 3.5–5.1)
Sodium: 137 mmol/L (ref 135–145)

## 2018-06-20 MED ORDER — CYCLOBENZAPRINE HCL 10 MG PO TABS
5.0000 mg | ORAL_TABLET | Freq: Once | ORAL | Status: AC
  Administered 2018-06-20: 5 mg via ORAL
  Filled 2018-06-20: qty 1

## 2018-06-20 MED ORDER — DIAZEPAM 5 MG PO TABS
5.0000 mg | ORAL_TABLET | Freq: Once | ORAL | Status: DC
  Filled 2018-06-20: qty 1

## 2018-06-20 MED ORDER — LIDOCAINE 5 % EX PTCH: 1.0000 | MEDICATED_PATCH | Freq: Two times a day (BID) | CUTANEOUS | 0 refills | Status: DC

## 2018-06-20 MED ORDER — CYCLOBENZAPRINE HCL 5 MG PO TABS: 5.0000 mg | ORAL_TABLET | Freq: Three times a day (TID) | ORAL | 0 refills | Status: DC | PRN

## 2018-06-20 MED ORDER — IOHEXOL 350 MG/ML SOLN
75.0000 mL | Freq: Once | INTRAVENOUS | Status: AC | PRN
  Administered 2018-06-20: 75 mL via INTRAVENOUS

## 2018-06-20 MED ORDER — SODIUM CHLORIDE 0.9% FLUSH: 3.0000 mL | Freq: Once | INTRAVENOUS | Status: DC

## 2018-06-20 MED ORDER — SODIUM CHLORIDE 0.9 % IV BOLUS
500.0000 mL | Freq: Once | INTRAVENOUS | Status: AC
  Administered 2018-06-20: 500 mL via INTRAVENOUS

## 2018-06-20 MED ORDER — LIDOCAINE HCL (PF) 1 % IJ SOLN
INTRAMUSCULAR | Status: AC
  Filled 2018-06-20: qty 5

## 2018-06-20 MED ORDER — LIDOCAINE 5 % EX PTCH
1.0000 | MEDICATED_PATCH | CUTANEOUS | Status: DC
  Administered 2018-06-20: 1 via TRANSDERMAL
  Filled 2018-06-20: qty 1

## 2018-06-20 NOTE — ED Notes (Signed)
Pt c/o dizziness when moving. Pt states that it is a lightheaded sensation. Pt reports that she almost fell this morning because she was dizzy.

## 2018-06-20 NOTE — ED Provider Notes (Signed)
[facility] Emergency Department Provider Note    First MD Initiated Contact with Patient 06/20/18 1022     (approximate)  I have reviewed the triage vital signs and the nursing notes.   HISTORY  Chief Complaint Dizziness and Neck Pain    HPI Alexandra Yang is a 73 y.o. female stents past medical history as described below presents the ER for 3 days of progressively worsening left-sided neck pain.  Pain is worse with movement.  Is been trying topical medications including over-the-counter medications without any improvement.  Feels like she slept on it wrong but the symptoms have been pervasive.  She does not recall any trauma.  States that she is having any intermittent numbness in her left arm secondary to the pain.   States this morning started developing dizziness and lightheadedness which was new for her.   Past Medical History:  Diagnosis Date  . Arthritis    osteoarthritis  . Cancer ([place]) 1995   endometrial ca  . COPD (chronic obstructive pulmonary disease) ([place])   . Diabetes mellitus without complication ([place])    type 2  . GERD (gastroesophageal reflux disease)   . History of kidney infection    had to have PICC line  . Neuropathy    both feet  . Osteopenia   . Osteoporosis   . Pneumonia    Family History  Problem Relation Age of Onset  . Pancreatic cancer Mother   . Cancer Mother        pancreaitis  . Lung cancer Father   . Breast cancer Sister 59   Past Surgical History:  Procedure Laterality Date  . ABDOMINAL HYSTERECTOMY    . CHOLECYSTECTOMY    . KNEE ARTHROSCOPY Right   . TENDON TRANSFER Right 01/20/2015   Procedure: RIGHT HAND AND MIDDLE FINGER TENDON RECONSTRUCTION AND TRANSFER;  Surgeon: Roseanne Kaufman, MD;  Location: [facility];  Service: Orthopedics;  Laterality: Right;   Patient Active Problem List   Diagnosis Date Noted  . Mixed hyperlipidemia 05/20/2018  . Urinary tract infection with hematuria 02/02/2018  . Acute low  back pain without sciatica 02/02/2018  . Acute upper respiratory infection 02/02/2018  . Nausea 02/02/2018  . Cough 01/04/2018  . Dysuria 11/15/2017  . Neuropathy 10/27/2017  . Encounter for general adult medical examination with abnormal findings 10/27/2017  . Uncontrolled type 2 diabetes mellitus with hyperglycemia ([place]) 10/27/2017  . COPD (chronic obstructive pulmonary disease) with chronic bronchitis ([facility]) 10/27/2017  . Sepsis ([place]) 12/19/2016  . Primary osteoarthritis of right hand 06/13/2014  . Inflammation of joint of right knee 11/10/2013  . Diabetes mellitus ([place]) 09/25/2011  . Essential hypertension 09/25/2011  . COPD (chronic obstructive pulmonary disease) ([place]) 09/25/2011      Prior to Admission medications   Medication Sig Start Date End Date Taking? Authorizing Provider  albuterol (PROVENTIL) (2.5 MG/3ML) 0.083% nebulizer solution Take 2.5 mg by nebulization every 6 (six) hours as needed for wheezing or shortness of breath.    [provider]  aspirin 81 MG chewable tablet Chew 81 mg by mouth every evening.     [provider]  benzonatate (TESSALON) 200 MG capsule Take 1 capsule (200 mg total) by mouth 3 (three) times daily as needed for cough. 01/04/18   Ronnell Freshwater, NP  cefUROXime (CEFTIN) 500 MG tablet Take 1 tablet (500 mg total) by mouth 2 (two) times daily with a meal. 02/17/18   Ronnell Freshwater, NP  cholecalciferol (VITAMIN D)  1000 UNITS tablet Take 1,000 Units by mouth daily.    [provider]  cyclobenzaprine (FLEXERIL) 5 MG tablet Take 1 tablet (5 mg total) by mouth 3 (three) times daily as needed for muscle spasms. 06/20/18   Merlyn Lot, MD  ertapenem 1 g in sodium chloride 0.9 % 50 mL Inject 1 g into the vein daily. Patient not taking: Reported on 05/20/2018 12/21/16   Epifanio Lesches, MD  glucose blood (ACCU-CHEK GUIDE) test strip Blood sugars tested QD and prn.  Dx - E11.65 05/20/18   Ronnell Freshwater, NP    HYDROcodone-acetaminophen (NORCO/VICODIN) 5-325 MG tablet Take 1 tablet by mouth every 6 (six) hours as needed for moderate pain. 02/02/18   Ronnell Freshwater, NP  hydrOXYzine (ATARAX/VISTARIL) 10 MG tablet Take 1 tablet (10 mg total) by mouth 3 (three) times daily as needed for itching. 08/23/17   Nance Pear, MD  ibuprofen (ADVIL,MOTRIN) 800 MG tablet Take 1 tablet (800 mg total) by mouth 3 (three) times daily as needed. 04/29/18   Ronnell Freshwater, NP  Insulin Glargine (TOUJEO MAX SOLOSTAR) 300 UNIT/ML SOPN Inject 24 Units into the skin 1 day or 1 dose for 1 dose. 07/21/17 07/22/17  Lavera Guise, MD  Insulin Pen Needle 32G X 4 MM MISC USE AS ONCE DAILY 08/12/17   Lavera Guise, MD  lidocaine (LIDODERM) 5 % Place 1 patch onto the skin every 12 (twelve) hours. Remove & Discard patch within 12 hours or as directed by MD 06/20/18 06/20/19  Merlyn Lot, MD  lisinopril (PRINIVIL,ZESTRIL) 10 MG tablet Take 1 tablet (10 mg total) by mouth daily. 11/20/17   Ronnell Freshwater, NP  metFORMIN (GLUCOPHAGE-XR) 500 MG 24 hr tablet TAKE 1 TABLET EVERY NIGHT 04/19/18   Ronnell Freshwater, NP  metoCLOPramide (REGLAN) 10 MG tablet Take 1 tablet (10 mg total) by mouth daily as needed. 08/13/17   Ronnell Freshwater, NP  Multiple Vitamins-Minerals (CENTRUM SILVER PO) Take 1 tablet by mouth daily.    [provider]  ondansetron (ZOFRAN ODT) 4 MG disintegrating tablet Take 1 tablet (4 mg total) by mouth every 8 (eight) hours as needed for nausea or vomiting. 01/23/18   Earleen Newport, MD  ondansetron (ZOFRAN) 4 MG tablet Take 1 tablet (4 mg total) by mouth every 8 (eight) hours as needed for nausea or vomiting. 02/02/18   Ronnell Freshwater, NP  pregabalin (LYRICA) 75 MG capsule Take 75 mg by mouth at bedtime as needed (pain).     [provider]  Semaglutide,0.25 or 0.5MG /DOS, (OZEMPIC, 0.25 OR 0.5 MG/DOSE,) 2 MG/1.5ML SOPN Inject 0.5 mg into the skin once a week. 05/20/18   Ronnell Freshwater, NP   simvastatin (ZOCOR) 40 MG tablet Take 1 tablet (40 mg total) by mouth at bedtime. 08/12/17   Lavera Guise, MD  vitamin B-12 (CYANOCOBALAMIN) 1000 MCG tablet Take 1,000 mcg by mouth daily.     [provider]    Allergies Sulfa antibiotics; Hydrocodone-guaifenesin; Oxycodone hcl; Percodan [oxycodone-aspirin]; Ciprofloxacin; Naproxen; and Ultram [tramadol]    Social History Social History   Tobacco Use  . Smoking status: Current Every Day Smoker    Packs/day: 0.50    Years: 40.00    Pack years: 20.00    Types: Cigarettes  . Smokeless tobacco: Never Used  Substance Use Topics  . Alcohol use: Yes    Comment: occasional  . Drug use: No    Review of Systems Patient denies headaches, rhinorrhea, blurry  vision, numbness, shortness of breath, chest pain, edema, cough, abdominal pain, nausea, vomiting, diarrhea, dysuria, fevers, rashes or hallucinations unless otherwise stated above in HPI. ____________________________________________   PHYSICAL EXAM:  VITAL SIGNS: Vitals:   06/20/18 0857  BP: (!) 157/71  Pulse: 96  Resp: 19  Temp: 97.9 F (36.6 C)  SpO2: 97%    Constitutional: Alert and oriented.  Eyes: Conjunctivae are normal.  Head: Atraumatic. Nose: No congestion/rhinnorhea. Mouth/Throat: Mucous membranes are moist.   Neck: No stridor. Painless ROM.  Cardiovascular: Normal rate, regular rhythm. Grossly normal heart sounds.  Good peripheral circulation. Respiratory: Normal respiratory effort.  No retractions. Lungs CTAB. Gastrointestinal: Soft and nontender. No distention. No abdominal bruits. No CVA tenderness. Genitourinary:  Musculoskeletal: Tenderness to palpation of the left paracervical muscles along the left trapezius.  No pain of the left shoulder or deltoid.  No deformity.  No midline cervical spine tenderness palpation.  No lower extremity tenderness nor edema.  No joint effusions. Neurologic:  Normal speech and language. No gross focal neurologic  deficits are appreciated. No facial droop Skin:  Skin is warm, dry and intact. No rash noted. Psychiatric: Mood and affect are normal. Speech and behavior are normal.  ____________________________________________   LABS (all labs ordered are listed, but only abnormal results are displayed)  Results for orders placed or performed during the hospital encounter of 06/20/18 (from the past 24 hour(s))  Basic metabolic panel     Status: Abnormal   Collection Time: 06/20/18  9:00 AM  Result Value Ref Range   Sodium 137 135 - 145 mmol/L   Potassium 4.5 3.5 - 5.1 mmol/L   Chloride 106 98 - 111 mmol/L   CO2 26 22 - 32 mmol/L   Glucose, Bld 159 (H) 70 - 99 mg/dL   BUN 27 (H) 8 - 23 mg/dL   Creatinine, Ser 0.53 0.44 - 1.00 mg/dL   Calcium 9.8 8.9 - 10.3 mg/dL   GFR calc non Af Amer >60 >60 mL/min   GFR calc Af Amer >60 >60 mL/min   Anion gap 5 5 - 15  CBC     Status: Abnormal   Collection Time: 06/20/18  9:00 AM  Result Value Ref Range   WBC 12.7 (H) 4.0 - 10.5 K/uL   RBC 4.09 3.87 - 5.11 MIL/uL   Hemoglobin 12.4 12.0 - 15.0 g/dL   HCT 37.8 36.0 - 46.0 %   MCV 92.4 80.0 - 100.0 fL   MCH 30.3 26.0 - 34.0 pg   MCHC 32.8 30.0 - 36.0 g/dL   RDW 12.3 11.5 - 15.5 %   Platelets 279 150 - 400 K/uL   nRBC 0.0 0.0 - 0.2 %   ____________________________________________ ____________________________________________ ED ECG REPORT I, Merlyn Lot, the attending physician, personally viewed and interpreted this ECG.   Date: 06/20/2018  EKG Time: 8:52  Rate: 95  Rhythm: normal EKG, normal sinus rhythm, unchanged from previous tracings  Axis: normal  Intervals:normal  ST&T Change: no stemi   RADIOLOGY  I personally reviewed all radiographic images ordered to evaluate for the above acute complaints and reviewed radiology reports and findings.  These findings were personally discussed with the patient.  Please see medical record for radiology  report.  ____________________________________________   PROCEDURES  Procedure(s) performed:  Procedures    Critical Care performed: no ____________________________________________   INITIAL IMPRESSION / ASSESSMENT AND PLAN / ED COURSE  Pertinent labs & imaging results that were available during my care of the patient were reviewed by  me and considered in my medical decision making (see chart for details).   DDX: torticollis, cervical strain, radiculopathy, vertebral/carotid dissection,  Alexandra Yang is a 73 y.o. who presents to the ED with symptoms concerning for torticollis muscle strain.  Patient has been trying over-the-counter medications at home for the past several days without improvement.  Given her age and report of lightheadedness will order CT imaging to further evaluate for vascular abnormality.  Doubt stroke as she does not have any focal deficits.  Will give gentle IV hydration as well as pain medication.  Patient declined Valium.  Informed her that I would not be giving her any narcotic pain medication for this but she did agree to take Flexeril.  Declined Tylenol or Motrin.  Clinical Course as of Jun 20 1224  Sun Jun 20, 2018  1226 Patient reassessed remains hemodynamically stable.  Will give Flexeril.  At this point do believe she stable and appropriate for outpatient follow-up.   [PR]    Clinical Course User Index [PR] Merlyn Lot, MD     As part of my medical decision making, I reviewed the following data within the [facility] notes reviewed and incorporated, Labs reviewed, notes from prior ED visits and [place] Controlled Substance Database   ____________________________________________   FINAL CLINICAL IMPRESSION(S) / ED DIAGNOSES  Final diagnoses:  Neck pain  Torticollis      NEW MEDICATIONS STARTED DURING THIS VISIT:  New Prescriptions   CYCLOBENZAPRINE (FLEXERIL) 5 MG TABLET    Take 1 tablet (5 mg total) by mouth  3 (three) times daily as needed for muscle spasms.   LIDOCAINE (LIDODERM) 5 %    Place 1 patch onto the skin every 12 (twelve) hours. Remove & Discard patch within 12 hours or as directed by MD     Note:  This document was prepared using Dragon voice recognition software and may include unintentional dictation errors.    Merlyn Lot, MD 06/20/18 1226

## 2018-06-20 NOTE — ED Triage Notes (Addendum)
Pt comes via POV from home with c/o neck pain/stiffness on left side and dizziness.  Pt states this started a few days ago and started on the right side of neck and shoulder. Pt states N/V. Pt states her neck will go into spasms and she can't sit up straight. Pt also states she feels it is swollen.  Pt also states she got real dizzy this am and almost fell but hit her hip against the sink counter.

## 2018-06-20 NOTE — ED Triage Notes (Signed)
First Nurse Note:  Patient is complaining of left sided neck pain x 3-4 days with dizziness.  Patient denies fever.  States, "If I turn the wrong way, I feel like I'm going to fall over."  Patient denies any falls.

## 2018-06-20 NOTE — ED Notes (Signed)
Pt taken in wheelchair to car by family. Given work note

## 2018-06-21 ENCOUNTER — Encounter (INDEPENDENT_AMBULATORY_CARE_PROVIDER_SITE_OTHER): Payer: Self-pay

## 2018-06-21 ENCOUNTER — Ambulatory Visit (INDEPENDENT_AMBULATORY_CARE_PROVIDER_SITE_OTHER): Payer: Medicare HMO | Admitting: Nurse Practitioner

## 2018-06-21 ENCOUNTER — Encounter: Payer: Self-pay | Admitting: Nurse Practitioner

## 2018-06-21 VITALS — BP 138/67 | HR 114 | Resp 16 | Ht 67.0 in | Wt 129.8 lb

## 2018-06-21 DIAGNOSIS — E1142 Type 2 diabetes mellitus with diabetic polyneuropathy: Secondary | ICD-10-CM | POA: Diagnosis not present

## 2018-06-21 DIAGNOSIS — I10 Essential (primary) hypertension: Secondary | ICD-10-CM | POA: Diagnosis not present

## 2018-06-21 DIAGNOSIS — J069 Acute upper respiratory infection, unspecified: Secondary | ICD-10-CM | POA: Diagnosis not present

## 2018-06-21 DIAGNOSIS — M62838 Other muscle spasm: Secondary | ICD-10-CM | POA: Diagnosis not present

## 2018-06-21 MED ORDER — CEFUROXIME AXETIL 500 MG PO TABS: 500.0000 mg | ORAL_TABLET | Freq: Two times a day (BID) | ORAL | 0 refills | Status: DC

## 2018-06-21 MED ORDER — CYCLOBENZAPRINE HCL 10 MG PO TABS: 10.0000 mg | ORAL_TABLET | Freq: Three times a day (TID) | ORAL | 1 refills | Status: DC | PRN

## 2018-06-21 NOTE — Progress Notes (Signed)
[facility] [place], [place] [address]  Internal MEDICINE  Office Visit Note  Patient Name: Alexandra Yang  [id number]  [id number]  Date of Service: 06/27/2018   Pt is here for a sick visit.  Chief Complaint  Patient presents with  . Pain    pt had ER visit for left side neck and shoulder pain on yesterday, pain is still there, pt also had a near fall     The patient is c/o left sided neck and shoulder pain. States that pain is progressively worsening.  Pain is worse with movement. Has tried over-the-counter medications without any improvement.  Feels like she slept on it wrong but the symptoms have been not changed over past several days..  She does not recall any trauma.  States that she is having any intermittent numbness in her left arm secondary to the pain. She was seen in the ER for this 06/20/2018. A CTA of the neck and was performed. There was some plaque formation in the carotids without evidence of stenosis, bilaterally. CT of the head was unremarkable. There was aortic atherosclerosis present. In the ER, she was given prescription for lidoderm patches which she has tried in the past and they have not helped. Labs were also drawn in the ER and her white blood cell count was elevated at 12.7.       Current Medication:  Outpatient Encounter Medications as of 06/21/2018  Medication Sig  . albuterol (PROVENTIL) (2.5 MG/3ML) 0.083% nebulizer solution Take 2.5 mg by nebulization every 6 (six) hours as needed for wheezing or shortness of breath.  Marland Kitchen aspirin 81 MG chewable tablet Chew 81 mg by mouth every evening.   . benzonatate (TESSALON) 200 MG capsule Take 1 capsule (200 mg total) by mouth 3 (three) times daily as needed for cough.  . cefUROXime (CEFTIN) 500 MG tablet Take 1 tablet (500 mg total) by mouth 2 (two) times daily with a meal.  . cholecalciferol (VITAMIN D) 1000 UNITS tablet Take 1,000 Units by mouth daily.  . cyclobenzaprine (FLEXERIL) 10 MG  tablet Take 1 tablet (10 mg total) by mouth 3 (three) times daily as needed for muscle spasms.  . ertapenem 1 g in sodium chloride 0.9 % 50 mL Inject 1 g into the vein daily. (Patient not taking: Reported on 05/20/2018)  . glucose blood (ACCU-CHEK GUIDE) test strip Blood sugars tested QD and prn.  Dx - E11.65  . HYDROcodone-acetaminophen (NORCO/VICODIN) 5-325 MG tablet Take 1 tablet by mouth every 6 (six) hours as needed for moderate pain.  . hydrOXYzine (ATARAX/VISTARIL) 10 MG tablet Take 1 tablet (10 mg total) by mouth 3 (three) times daily as needed for itching.  Marland Kitchen ibuprofen (ADVIL,MOTRIN) 800 MG tablet Take 1 tablet (800 mg total) by mouth 3 (three) times daily as needed.  . Insulin Glargine (TOUJEO MAX SOLOSTAR) 300 UNIT/ML SOPN Inject 24 Units into the skin 1 day or 1 dose for 1 dose.  . Insulin Pen Needle 32G X 4 MM MISC USE AS ONCE DAILY  . lidocaine (LIDODERM) 5 % Place 1 patch onto the skin every 12 (twelve) hours. Remove & Discard patch within 12 hours or as directed by MD  . lisinopril (PRINIVIL,ZESTRIL) 10 MG tablet Take 1 tablet (10 mg total) by mouth daily.  . metFORMIN (GLUCOPHAGE-XR) 500 MG 24 hr tablet TAKE 1 TABLET EVERY NIGHT  . metoCLOPramide (REGLAN) 10 MG tablet Take 1 tablet (10 mg total) by mouth daily as needed.  Marland Kitchen  Multiple Vitamins-Minerals (CENTRUM SILVER PO) Take 1 tablet by mouth daily.  . ondansetron (ZOFRAN ODT) 4 MG disintegrating tablet Take 1 tablet (4 mg total) by mouth every 8 (eight) hours as needed for nausea or vomiting.  . ondansetron (ZOFRAN) 4 MG tablet Take 1 tablet (4 mg total) by mouth every 8 (eight) hours as needed for nausea or vomiting.  . pregabalin (LYRICA) 75 MG capsule Take 75 mg by mouth at bedtime as needed (pain).   . Semaglutide,0.25 or 0.5MG /DOS, (OZEMPIC, 0.25 OR 0.5 MG/DOSE,) 2 MG/1.5ML SOPN Inject 0.5 mg into the skin once a week.  . simvastatin (ZOCOR) 40 MG tablet Take 1 tablet (40 mg total) by mouth at bedtime.  . vitamin B-12  (CYANOCOBALAMIN) 1000 MCG tablet Take 1,000 mcg by mouth daily.   . [DISCONTINUED] cefUROXime (CEFTIN) 500 MG tablet Take 1 tablet (500 mg total) by mouth 2 (two) times daily with a meal.  . [DISCONTINUED] cyclobenzaprine (FLEXERIL) 5 MG tablet Take 1 tablet (5 mg total) by mouth 3 (three) times daily as needed for muscle spasms.   No facility-administered encounter medications on file as of 06/21/2018.       Medical History: Past Medical History:  Diagnosis Date  . Arthritis    osteoarthritis  . Cancer ([place]) 1995   endometrial ca  . COPD (chronic obstructive pulmonary disease) ([place])   . Diabetes mellitus without complication ([place])    type 2  . GERD (gastroesophageal reflux disease)   . History of kidney infection    had to have PICC line  . Neuropathy    both feet  . Osteopenia   . Osteoporosis   . Pneumonia     Today's Vitals   06/21/18 1156  BP: 138/67  Pulse: (!) 114  Resp: 16  SpO2: 97%  Weight: 129 lb 12.8 oz (58.9 kg)  Height: 5\' 7"  (1.702 m)   Body mass index is 20.33 kg/m.   Review of Systems  Constitutional: Positive for activity change. Negative for chills, fatigue and unexpected weight change.  HENT: Negative for congestion, postnasal drip, rhinorrhea, sneezing and sore throat.   Respiratory: Negative for cough, chest tightness, shortness of breath and wheezing.   Cardiovascular: Negative for chest pain and palpitations.  Gastrointestinal: Negative for abdominal pain, constipation, diarrhea, nausea and vomiting.  Endocrine: Negative for cold intolerance, heat intolerance, polydipsia and polyuria.  Musculoskeletal: Positive for arthralgias, myalgias, neck pain and neck stiffness. Negative for back pain and joint swelling.  Skin: Negative for rash.  Neurological: Positive for headaches. Negative for dizziness, tremors and numbness.  Hematological: Negative for adenopathy. Does not bruise/bleed easily.  Psychiatric/Behavioral: Negative for behavioral  problems (Depression), sleep disturbance and suicidal ideas. The patient is not nervous/anxious.     Physical Exam Vitals signs and nursing note reviewed.  Constitutional:      General: She is not in acute distress.    Appearance: Normal appearance. She is well-developed. She is not diaphoretic.  HENT:     Head: Normocephalic and atraumatic.     Mouth/Throat:     Pharynx: No oropharyngeal exudate.  Eyes:     Conjunctiva/sclera: Conjunctivae normal.     Pupils: Pupils are equal, round, and reactive to light.  Neck:     Musculoskeletal: Normal range of motion and neck supple. Muscular tenderness present.     Thyroid: No thyromegaly.     Vascular: No carotid bruit or JVD.     Trachea: No tracheal deviation.     Comments: Limited ROM  when bending and twisting her head left and right, but mostly to the left.  Cardiovascular:     Rate and Rhythm: Normal rate and regular rhythm.     Heart sounds: Normal heart sounds. No murmur. No friction rub. No gallop.   Pulmonary:     Effort: Pulmonary effort is normal. No respiratory distress.     Breath sounds: Normal breath sounds. No wheezing or rales.  Chest:     Chest wall: No tenderness.  Abdominal:     General: Bowel sounds are normal.     Palpations: Abdomen is soft.  Musculoskeletal: Normal range of motion.  Lymphadenopathy:     Cervical: No cervical adenopathy.  Skin:    General: Skin is warm and dry.  Neurological:     Mental Status: She is alert and oriented to person, place, and time. Mental status is at baseline.     Cranial Nerves: No cranial nerve deficit.  Psychiatric:        Behavior: Behavior normal.        Thought Content: Thought content normal.        Judgment: Judgment normal.   Assessment/Plan: 1. Acute upper respiratory infection Elevated WBC in ER at 12.7. start ceftin 500mg  twice daily for 10 days. Will recheck WBC after completion of antibiotic treatment.  - cefUROXime (CEFTIN) 500 MG tablet; Take 1 tablet  (500 mg total) by mouth 2 (two) times daily with a meal.  Dispense: 20 tablet; Refill: 0  2. Muscle spasms of neck Increase dose of flexeril to 10mg  which may be taken three times daily if needed for muscle pain and spasms. Advised her to use this only if not driving or working as it can make her very sleepy or dizzy. She voiced understanding. Recommended she take NSAIDs as needed and as indicated. Apply heating pad to affected area in 20 minute intervals to loosen tight muscles.  - cyclobenzaprine (FLEXERIL) 10 MG tablet; Take 1 tablet (10 mg total) by mouth 3 (three) times daily as needed for muscle spasms.  Dispense: 30 tablet; Refill: 1  3. Type 2 diabetes mellitus with peripheral neuropathy (HCC) Continue diabetic medication and Lyrica as prescribed   4. Essential hypertension Stable. Continue bp medication as prescribed   General Counseling: Sandy Salaam understanding of the findings of todays visit and agrees with plan of treatment. I have discussed any further diagnostic evaluation that may be needed or ordered today. We also reviewed her medications today. she has been encouraged to call the office with any questions or concerns that should arise related to todays visit.    Counseling:  This patient was seen by [place] with Dr Lavera Guise as a part of collaborative care agreement  Meds ordered this encounter  Medications  . cefUROXime (CEFTIN) 500 MG tablet    Sig: Take 1 tablet (500 mg total) by mouth 2 (two) times daily with a meal.    Dispense:  20 tablet    Refill:  0    Order Specific Question:   Supervising Provider    Answer:   Lavera Guise [place]  . cyclobenzaprine (FLEXERIL) 10 MG tablet    Sig: Take 1 tablet (10 mg total) by mouth 3 (three) times daily as needed for muscle spasms.    Dispense:  30 tablet    Refill:  1    Order Specific Question:   Supervising Provider    Answer:   Lavera Guise [[id number]]    Time  spent: 25 Minutes

## 2018-06-22 ENCOUNTER — Telehealth: Payer: Self-pay | Admitting: Nurse Practitioner

## 2018-06-22 NOTE — Telephone Encounter (Signed)
Medication approved by insurance for 10 day supply . Pharmacy notified

## 2018-06-27 DIAGNOSIS — M62838 Other muscle spasm: Secondary | ICD-10-CM | POA: Insufficient documentation

## 2018-06-30 ENCOUNTER — Encounter: Payer: Self-pay | Admitting: Adult Health

## 2018-06-30 ENCOUNTER — Other Ambulatory Visit: Payer: Self-pay

## 2018-06-30 ENCOUNTER — Ambulatory Visit (INDEPENDENT_AMBULATORY_CARE_PROVIDER_SITE_OTHER): Payer: Medicare HMO | Admitting: Adult Health

## 2018-06-30 VITALS — BP 111/55 | HR 115 | Temp 98.5°F | Resp 16 | Ht 67.0 in | Wt 130.0 lb

## 2018-06-30 DIAGNOSIS — M545 Low back pain, unspecified: Secondary | ICD-10-CM

## 2018-06-30 DIAGNOSIS — R300 Dysuria: Secondary | ICD-10-CM

## 2018-06-30 DIAGNOSIS — R319 Hematuria, unspecified: Secondary | ICD-10-CM | POA: Diagnosis not present

## 2018-06-30 DIAGNOSIS — N390 Urinary tract infection, site not specified: Secondary | ICD-10-CM

## 2018-06-30 LAB — POCT URINALYSIS DIPSTICK
Glucose, UA: POSITIVE — AB
Nitrite, UA: NEGATIVE
Protein, UA: POSITIVE — AB
Spec Grav, UA: 1.015 (ref 1.010–1.025)
Urobilinogen, UA: 0.2 E.U./dL
pH, UA: 5 (ref 5.0–8.0)

## 2018-06-30 MED ORDER — IBUPROFEN 800 MG PO TABS: 800.0000 mg | ORAL_TABLET | Freq: Three times a day (TID) | ORAL | 1 refills | Status: DC | PRN

## 2018-06-30 MED ORDER — LEVOFLOXACIN 500 MG PO TABS: 500.0000 mg | ORAL_TABLET | Freq: Every day | ORAL | 0 refills | Status: DC

## 2018-06-30 NOTE — Progress Notes (Signed)
[facility] [place], [place] [address]  Internal MEDICINE  Office Visit Note  Patient Name: Alexandra Yang  [id number]  [id number]  Date of Service: 06/30/2018  Chief Complaint  Patient presents with  . Neck Pain    no better , can not take the flexeril due to next day feeling of hangover  . Back Pain  . Nausea  . Fever     HPI Pt is here for a sick visit. Pt reports continued next pain now for a few weeks.  She reports she was seen in the ED, and this office for the neck pain.  She was placed on ceftin, and flexeril.  She has not had much relief.  She feels worse now.  She reports low back pain, body aches, chills/fever, anorexia and nausea. She reports some urinary discomfort.         Current Medication:  Outpatient Encounter Medications as of 06/30/2018  Medication Sig  . albuterol (PROVENTIL) (2.5 MG/3ML) 0.083% nebulizer solution Take 2.5 mg by nebulization every 6 (six) hours as needed for wheezing or shortness of breath.  Marland Kitchen aspirin 81 MG chewable tablet Chew 81 mg by mouth every evening.   . benzonatate (TESSALON) 200 MG capsule Take 1 capsule (200 mg total) by mouth 3 (three) times daily as needed for cough.  . cholecalciferol (VITAMIN D) 1000 UNITS tablet Take 1,000 Units by mouth daily.  Marland Kitchen glucose blood (ACCU-CHEK GUIDE) test strip Blood sugars tested QD and prn.  Dx - E11.65  . hydrOXYzine (ATARAX/VISTARIL) 10 MG tablet Take 1 tablet (10 mg total) by mouth 3 (three) times daily as needed for itching.  Marland Kitchen ibuprofen (ADVIL,MOTRIN) 800 MG tablet Take 1 tablet (800 mg total) by mouth 3 (three) times daily as needed.  . Insulin Pen Needle 32G X 4 MM MISC USE AS ONCE DAILY  . lidocaine (LIDODERM) 5 % Place 1 patch onto the skin every 12 (twelve) hours. Remove & Discard patch within 12 hours or as directed by MD  . lisinopril (PRINIVIL,ZESTRIL) 10 MG tablet Take 1 tablet (10 mg total) by mouth daily.  . metFORMIN (GLUCOPHAGE-XR) 500 MG 24 hr  tablet TAKE 1 TABLET EVERY NIGHT  . metoCLOPramide (REGLAN) 10 MG tablet Take 1 tablet (10 mg total) by mouth daily as needed.  . Multiple Vitamins-Minerals (CENTRUM SILVER PO) Take 1 tablet by mouth daily.  . ondansetron (ZOFRAN ODT) 4 MG disintegrating tablet Take 1 tablet (4 mg total) by mouth every 8 (eight) hours as needed for nausea or vomiting.  . ondansetron (ZOFRAN) 4 MG tablet Take 1 tablet (4 mg total) by mouth every 8 (eight) hours as needed for nausea or vomiting.  . pregabalin (LYRICA) 75 MG capsule Take 75 mg by mouth at bedtime as needed (pain).   . Semaglutide,0.25 or 0.5MG /DOS, (OZEMPIC, 0.25 OR 0.5 MG/DOSE,) 2 MG/1.5ML SOPN Inject 0.5 mg into the skin once a week.  . simvastatin (ZOCOR) 40 MG tablet Take 1 tablet (40 mg total) by mouth at bedtime.  . vitamin B-12 (CYANOCOBALAMIN) 1000 MCG tablet Take 1,000 mcg by mouth daily.   . [DISCONTINUED] cefUROXime (CEFTIN) 500 MG tablet Take 1 tablet (500 mg total) by mouth 2 (two) times daily with a meal.  . cyclobenzaprine (FLEXERIL) 10 MG tablet Take 1 tablet (10 mg total) by mouth 3 (three) times daily as needed for muscle spasms. (Patient not taking: Reported on 06/30/2018)  . ertapenem 1 g in sodium chloride 0.9 % 50 mL Inject  1 g into the vein daily. (Patient not taking: Reported on 05/20/2018)  . HYDROcodone-acetaminophen (NORCO/VICODIN) 5-325 MG tablet Take 1 tablet by mouth every 6 (six) hours as needed for moderate pain. (Patient not taking: Reported on 06/30/2018)  . Insulin Glargine (TOUJEO MAX SOLOSTAR) 300 UNIT/ML SOPN Inject 24 Units into the skin 1 day or 1 dose for 1 dose.  . levofloxacin (LEVAQUIN) 500 MG tablet Take 1 tablet (500 mg total) by mouth daily.   No facility-administered encounter medications on file as of 06/30/2018.       Medical History: Past Medical History:  Diagnosis Date  . Arthritis    osteoarthritis  . Cancer ([place]) 1995   endometrial ca  . COPD (chronic obstructive pulmonary disease) ([place])   .  Diabetes mellitus without complication ([place])    type 2  . GERD (gastroesophageal reflux disease)   . History of kidney infection    had to have PICC line  . Neuropathy    both feet  . Osteopenia   . Osteoporosis   . Pneumonia      Vital Signs: BP (!) 111/55   Pulse (!) 115   Temp 98.5 F (36.9 C) (Oral)   Resp 16   Ht 5\' 7"  (1.702 m)   Wt 130 lb (59 kg)   SpO2 98%   BMI 20.36 kg/m    Review of Systems  Constitutional: Negative for chills, fatigue and unexpected weight change.  HENT: Negative for congestion, rhinorrhea, sneezing and sore throat.   Eyes: Negative for photophobia, pain and redness.  Respiratory: Negative for cough, chest tightness and shortness of breath.   Cardiovascular: Negative for chest pain and palpitations.  Gastrointestinal: Negative for abdominal pain, constipation, diarrhea, nausea and vomiting.  Endocrine: Negative.   Genitourinary: Negative for dysuria and frequency.  Musculoskeletal: Negative for arthralgias, back pain, joint swelling and neck pain.  Skin: Negative for rash.  Allergic/Immunologic: Negative.   Neurological: Negative for tremors and numbness.  Hematological: Negative for adenopathy. Does not bruise/bleed easily.  Psychiatric/Behavioral: Negative for behavioral problems and sleep disturbance. The patient is not nervous/anxious.     Physical Exam Vitals signs and nursing note reviewed.  Constitutional:      General: She is not in acute distress.    Appearance: She is well-developed. She is not diaphoretic.  HENT:     Head: Normocephalic and atraumatic.     Mouth/Throat:     Pharynx: No oropharyngeal exudate.  Eyes:     Pupils: Pupils are equal, round, and reactive to light.  Neck:     Musculoskeletal: Normal range of motion and neck supple.     Thyroid: No thyromegaly.     Vascular: No JVD.     Trachea: No tracheal deviation.  Cardiovascular:     Rate and Rhythm: Normal rate and regular rhythm.     Heart sounds:  Normal heart sounds. No murmur. No friction rub. No gallop.   Pulmonary:     Effort: Pulmonary effort is normal. No respiratory distress.     Breath sounds: Normal breath sounds. No wheezing or rales.  Chest:     Chest wall: No tenderness.  Abdominal:     Palpations: Abdomen is soft.     Tenderness: There is no abdominal tenderness. There is no guarding.  Musculoskeletal: Normal range of motion.  Lymphadenopathy:     Cervical: No cervical adenopathy.  Skin:    General: Skin is warm and dry.  Neurological:     Mental Status: She  is alert and oriented to person, place, and time.     Cranial Nerves: No cranial nerve deficit.  Psychiatric:        Behavior: Behavior normal.        Thought Content: Thought content normal.        Judgment: Judgment normal.    Assessment/Plan: 1. Urinary tract infection with hematuria, site unspecified Pt encouraged to stop ceftin and start levaquin. Encouraged patient to drink plenty of fluids, rest and return to clinic if symptoms fail to improve. This UTI is likely the cause of her tachycardia.  Although repeat pulse was found to be 98bpm on exam.  - CULTURE, URINE COMPREHENSIVE - levofloxacin (LEVAQUIN) 500 MG tablet; Take 1 tablet (500 mg total) by mouth daily.  Dispense: 10 tablet; Refill: 0  2. Midline low back pain without sciatica, unspecified chronicity Likely due to UTI, will continue to follow.  Offered pyridium, pt declined.  She did request hydrocodone, I declined to prescribe that at this time.  We will wait for the cutlure and see how the levaquin helps with symptoms.   3. Dysuria - POCT Urinalysis Dipstick  General Counseling: Sharron verbalizes understanding of the findings of todays visit and agrees with plan of treatment. I have discussed any further diagnostic evaluation that may be needed or ordered today. We also reviewed her medications today. she has been encouraged to call the office with any questions or concerns that should arise  related to todays visit.   Orders Placed This Encounter  Procedures  . CULTURE, URINE COMPREHENSIVE  . POCT Urinalysis Dipstick    Meds ordered this encounter  Medications  . levofloxacin (LEVAQUIN) 500 MG tablet    Sig: Take 1 tablet (500 mg total) by mouth daily.    Dispense:  10 tablet    Refill:  0    Time spent: 25 Minutes  This patient was seen by Orson Gear AGNP-C in Collaboration with Dr Lavera Guise as a part of collaborative care agreement.  Kendell Bane AGNP-C Internal Medicine

## 2018-06-30 NOTE — Patient Instructions (Signed)
Urinary Tract Infection, Adult A urinary tract infection (UTI) is an infection of any part of the urinary tract. The urinary tract includes:  The kidneys.  The ureters.  The bladder.  The urethra. These organs make, store, and get rid of pee (urine) in the body. What are the causes? This is caused by germs (bacteria) in your genital area. These germs grow and cause swelling (inflammation) of your urinary tract. What increases the risk? You are more likely to develop this condition if:  You have a small, thin tube (catheter) to drain pee.  You cannot control when you pee or poop (incontinence).  You are female, and: ? You use these methods to prevent pregnancy: ? A medicine that kills sperm (spermicide). ? A device that blocks sperm (diaphragm). ? You have low levels of a female hormone (estrogen). ? You are pregnant.  You have genes that add to your risk.  You are sexually active.  You take antibiotic medicines.  You have trouble peeing because of: ? A prostate that is bigger than normal, if you are female. ? A blockage in the part of your body that drains pee from the bladder (urethra). ? A kidney stone. ? A nerve condition that affects your bladder (neurogenic bladder). ? Not getting enough to drink. ? Not peeing often enough.  You have other conditions, such as: ? Diabetes. ? A weak disease-fighting system (immune system). ? Sickle cell disease. ? Gout. ? Injury of the spine. What are the signs or symptoms? Symptoms of this condition include:  Needing to pee right away (urgently).  Peeing often.  Peeing small amounts often.  Pain or burning when peeing.  Blood in the pee.  Pee that smells bad or not like normal.  Trouble peeing.  Pee that is cloudy.  Fluid coming from the vagina, if you are female.  Pain in the belly or lower back. Other symptoms include:  Throwing up (vomiting).  No urge to eat.  Feeling mixed up (confused).  Being tired  and grouchy (irritable).  A fever.  Watery poop (diarrhea). How is this treated? This condition may be treated with:  Antibiotic medicine.  Other medicines.  Drinking enough water. Follow these instructions at home:  Medicines  Take over-the-counter and prescription medicines only as told by your doctor.  If you were prescribed an antibiotic medicine, take it as told by your doctor. Do not stop taking it even if you start to feel better. General instructions  Make sure you: ? Pee until your bladder is empty. ? Do not hold pee for a long time. ? Empty your bladder after sex. ? Wipe from front to back after pooping if you are a female. Use each tissue one time when you wipe.  Drink enough fluid to keep your pee pale yellow.  Keep all follow-up visits as told by your doctor. This is important. Contact a doctor if:  You do not get better after 1-2 days.  Your symptoms go away and then come back. Get help right away if:  You have very bad back pain.  You have very bad pain in your lower belly.  You have a fever.  You are sick to your stomach (nauseous).  You are throwing up. Summary  A urinary tract infection (UTI) is an infection of any part of the urinary tract.  This condition is caused by germs in your genital area.  There are many risk factors for a UTI. These include having a small, thin   tube to drain pee and not being able to control when you pee or poop.  Treatment includes antibiotic medicines for germs.  Drink enough fluid to keep your pee pale yellow. This information is not intended to replace advice given to you by your health care provider. Make sure you discuss any questions you have with your health care provider. Document Released: 10/22/2007 Document Revised: 11/12/2017 Document Reviewed: 11/12/2017 Elsevier Interactive Patient Education  2019 Elsevier Inc.  

## 2018-07-03 ENCOUNTER — Other Ambulatory Visit: Payer: Self-pay | Admitting: Adult Health

## 2018-07-03 LAB — CULTURE, URINE COMPREHENSIVE

## 2018-07-03 MED ORDER — NITROFURANTOIN MONOHYD MACRO 100 MG PO CAPS: 100.0000 mg | ORAL_CAPSULE | Freq: Two times a day (BID) | ORAL | 0 refills | Status: DC

## 2018-07-03 NOTE — Progress Notes (Signed)
To [facility] in [place] and let patient know today.  Pts urine culture shows that her E. coli is resistant to levofloxacin which is what she was prescribed at the visit.  We will send Macrobid

## 2018-07-26 ENCOUNTER — Encounter: Payer: Self-pay | Admitting: Adult Health

## 2018-07-26 ENCOUNTER — Other Ambulatory Visit: Payer: Self-pay

## 2018-07-26 ENCOUNTER — Ambulatory Visit (INDEPENDENT_AMBULATORY_CARE_PROVIDER_SITE_OTHER): Payer: Medicare HMO | Admitting: Nurse Practitioner

## 2018-07-26 VITALS — BP 127/67 | HR 103 | Temp 99.1°F | Resp 16 | Ht 67.0 in | Wt 128.8 lb

## 2018-07-26 DIAGNOSIS — R509 Fever, unspecified: Secondary | ICD-10-CM | POA: Insufficient documentation

## 2018-07-26 DIAGNOSIS — J101 Influenza due to other identified influenza virus with other respiratory manifestations: Secondary | ICD-10-CM

## 2018-07-26 DIAGNOSIS — R319 Hematuria, unspecified: Secondary | ICD-10-CM | POA: Diagnosis not present

## 2018-07-26 DIAGNOSIS — N390 Urinary tract infection, site not specified: Secondary | ICD-10-CM | POA: Diagnosis not present

## 2018-07-26 DIAGNOSIS — R300 Dysuria: Secondary | ICD-10-CM | POA: Diagnosis not present

## 2018-07-26 DIAGNOSIS — R110 Nausea: Secondary | ICD-10-CM | POA: Diagnosis not present

## 2018-07-26 LAB — POCT URINALYSIS DIPSTICK
Bilirubin, UA: POSITIVE
GLUCOSE UA: POSITIVE — AB
Ketones, UA: NEGATIVE
Nitrite, UA: NEGATIVE
Protein, UA: POSITIVE — AB
Spec Grav, UA: 1.025 (ref 1.010–1.025)
Urobilinogen, UA: 0.2 E.U./dL
pH, UA: 5 (ref 5.0–8.0)

## 2018-07-26 LAB — POCT INFLUENZA A/B
Influenza A, POC: POSITIVE — AB
Influenza B, POC: NEGATIVE

## 2018-07-26 MED ORDER — LEVOFLOXACIN 500 MG PO TABS: 500.0000 mg | ORAL_TABLET | Freq: Every day | ORAL | 0 refills | Status: DC

## 2018-07-26 MED ORDER — ONDANSETRON HCL 4 MG PO TABS: 4.0000 mg | ORAL_TABLET | Freq: Three times a day (TID) | ORAL | 2 refills | Status: DC | PRN

## 2018-07-26 MED ORDER — OSELTAMIVIR PHOSPHATE 75 MG PO CAPS: 75.0000 mg | ORAL_CAPSULE | Freq: Two times a day (BID) | ORAL | 0 refills | Status: DC

## 2018-07-26 NOTE — Progress Notes (Signed)
[facility] [place], [place] [address]  Internal MEDICINE  Office Visit Note  Patient Name: Alexandra Yang  [id number]  [id number]  Date of Service: 07/26/2018   Pt is here for a sick visit.  Chief Complaint  Patient presents with  . Nasal Congestion    pt just came from vacation traveled on a cruise ship, started feeling bad last day of cruise returned home on saturday  . Cough  . Fatigue  . Fever  . Chills  . Headache     The patient is also having urgency with urination. Started Saturday along with nasal congestion, headache, fever, and chills.   Cough  This is a new problem. The current episode started in the past 7 days. The problem has been rapidly worsening. The problem occurs constantly. The cough is productive of sputum. Associated symptoms include chills, ear congestion, a fever, headaches, myalgias, nasal congestion, postnasal drip, rhinorrhea, a sore throat and sweats. Pertinent negatives include no chest pain, rash, shortness of breath or wheezing. She has tried OTC cough suppressant and rest for the symptoms.        Current Medication:  Outpatient Encounter Medications as of 07/26/2018  Medication Sig  . albuterol (PROVENTIL) (2.5 MG/3ML) 0.083% nebulizer solution Take 2.5 mg by nebulization every 6 (six) hours as needed for wheezing or shortness of breath.  Marland Kitchen aspirin 81 MG chewable tablet Chew 81 mg by mouth every evening.   . benzonatate (TESSALON) 200 MG capsule Take 1 capsule (200 mg total) by mouth 3 (three) times daily as needed for cough.  . cholecalciferol (VITAMIN D) 1000 UNITS tablet Take 1,000 Units by mouth daily.  . cyclobenzaprine (FLEXERIL) 10 MG tablet Take 1 tablet (10 mg total) by mouth 3 (three) times daily as needed for muscle spasms. (Patient not taking: Reported on 06/30/2018)  . ertapenem 1 g in sodium chloride 0.9 % 50 mL Inject 1 g into the vein daily. (Patient not taking: Reported on 05/20/2018)  . glucose blood  (ACCU-CHEK GUIDE) test strip Blood sugars tested QD and prn.  Dx - E11.65  . HYDROcodone-acetaminophen (NORCO/VICODIN) 5-325 MG tablet Take 1 tablet by mouth every 6 (six) hours as needed for moderate pain. (Patient not taking: Reported on 06/30/2018)  . hydrOXYzine (ATARAX/VISTARIL) 10 MG tablet Take 1 tablet (10 mg total) by mouth 3 (three) times daily as needed for itching.  Marland Kitchen ibuprofen (ADVIL,MOTRIN) 800 MG tablet Take 1 tablet (800 mg total) by mouth 3 (three) times daily as needed.  . Insulin Glargine (TOUJEO MAX SOLOSTAR) 300 UNIT/ML SOPN Inject 24 Units into the skin 1 day or 1 dose for 1 dose.  . Insulin Pen Needle 32G X 4 MM MISC USE AS ONCE DAILY  . levofloxacin (LEVAQUIN) 500 MG tablet Take 1 tablet (500 mg total) by mouth daily.  Marland Kitchen lidocaine (LIDODERM) 5 % Place 1 patch onto the skin every 12 (twelve) hours. Remove & Discard patch within 12 hours or as directed by MD  . lisinopril (PRINIVIL,ZESTRIL) 10 MG tablet Take 1 tablet (10 mg total) by mouth daily.  . metFORMIN (GLUCOPHAGE-XR) 500 MG 24 hr tablet TAKE 1 TABLET EVERY NIGHT  . metoCLOPramide (REGLAN) 10 MG tablet Take 1 tablet (10 mg total) by mouth daily as needed.  . Multiple Vitamins-Minerals (CENTRUM SILVER PO) Take 1 tablet by mouth daily.  . ondansetron (ZOFRAN ODT) 4 MG disintegrating tablet Take 1 tablet (4 mg total) by mouth every 8 (eight) hours as needed for nausea  or vomiting.  . ondansetron (ZOFRAN) 4 MG tablet Take 1 tablet (4 mg total) by mouth every 8 (eight) hours as needed for nausea or vomiting.  Marland Kitchen oseltamivir (TAMIFLU) 75 MG capsule Take 1 capsule (75 mg total) by mouth 2 (two) times daily.  . pregabalin (LYRICA) 75 MG capsule Take 75 mg by mouth at bedtime as needed (pain).   . Semaglutide,0.25 or 0.5MG /DOS, (OZEMPIC, 0.25 OR 0.5 MG/DOSE,) 2 MG/1.5ML SOPN Inject 0.5 mg into the skin once a week.  . simvastatin (ZOCOR) 40 MG tablet Take 1 tablet (40 mg total) by mouth at bedtime.  . vitamin B-12 (CYANOCOBALAMIN)  1000 MCG tablet Take 1,000 mcg by mouth daily.   . [DISCONTINUED] levofloxacin (LEVAQUIN) 500 MG tablet Take 1 tablet (500 mg total) by mouth daily.  . [DISCONTINUED] nitrofurantoin, macrocrystal-monohydrate, (MACROBID) 100 MG capsule Take 1 capsule (100 mg total) by mouth 2 (two) times daily.  . [DISCONTINUED] ondansetron (ZOFRAN) 4 MG tablet Take 1 tablet (4 mg total) by mouth every 8 (eight) hours as needed for nausea or vomiting.   No facility-administered encounter medications on file as of 07/26/2018.       Medical History: Past Medical History:  Diagnosis Date  . Arthritis    osteoarthritis  . Cancer ([place]) 1995   endometrial ca  . COPD (chronic obstructive pulmonary disease) ([place])   . Diabetes mellitus without complication ([place])    type 2  . GERD (gastroesophageal reflux disease)   . History of kidney infection    had to have PICC line  . Neuropathy    both feet  . Osteopenia   . Osteoporosis   . Pneumonia     Today's Vitals   07/26/18 1407  BP: 127/67  Pulse: (!) 103  Resp: 16  Temp: 99.1 F (37.3 C)  SpO2: 95%  Weight: 128 lb 12.8 oz (58.4 kg)  Height: 5\' 7"  (1.702 m)   Body mass index is 20.17 kg/m.  Review of Systems  Constitutional: Positive for activity change, chills, fatigue and fever. Negative for unexpected weight change.  HENT: Positive for congestion, postnasal drip, rhinorrhea, sinus pressure, sinus pain and sore throat. Negative for sneezing.   Respiratory: Positive for cough. Negative for chest tightness, shortness of breath and wheezing.   Cardiovascular: Negative for chest pain and palpitations.  Gastrointestinal: Positive for nausea and vomiting. Negative for abdominal pain, constipation and diarrhea.  Endocrine: Negative for cold intolerance, heat intolerance, polydipsia and polyuria.  Genitourinary: Positive for dysuria, frequency and urgency.  Musculoskeletal: Positive for arthralgias, myalgias, neck pain and neck stiffness. Negative for  back pain and joint swelling.  Skin: Negative for rash.  Neurological: Positive for headaches. Negative for dizziness, tremors and numbness.  Hematological: Negative for adenopathy. Does not bruise/bleed easily.  Psychiatric/Behavioral: Negative for behavioral problems (Depression), sleep disturbance and suicidal ideas. The patient is not nervous/anxious.     Physical Exam Vitals signs and nursing note reviewed.  Constitutional:      General: She is not in acute distress.    Appearance: She is well-developed. She is ill-appearing. She is not diaphoretic.  HENT:     Head: Normocephalic and atraumatic.     Nose: Congestion and rhinorrhea present.     Mouth/Throat:     Pharynx: Posterior oropharyngeal erythema present. No oropharyngeal exudate.  Eyes:     Pupils: Pupils are equal, round, and reactive to light.  Neck:     Musculoskeletal: Normal range of motion and neck supple.  Thyroid: No thyromegaly.     Vascular: No JVD.     Trachea: No tracheal deviation.  Cardiovascular:     Rate and Rhythm: Normal rate and regular rhythm.     Heart sounds: Normal heart sounds. No murmur. No friction rub. No gallop.   Pulmonary:     Effort: Pulmonary effort is normal. No respiratory distress.     Breath sounds: Wheezing present. No rales.     Comments: Congested, non-productive cough present.  Chest:     Chest wall: No tenderness.  Abdominal:     General: Bowel sounds are normal.     Palpations: Abdomen is soft.  Genitourinary:    Comments: Urine sample positive for protein and glucose. Also positive for moderate WBC.  Musculoskeletal: Normal range of motion.  Lymphadenopathy:     Cervical: No cervical adenopathy.  Skin:    General: Skin is warm and dry.  Neurological:     Mental Status: She is alert and oriented to person, place, and time.     Cranial Nerves: No cranial nerve deficit.  Psychiatric:        Behavior: Behavior normal.        Thought Content: Thought content normal.         Judgment: Judgment normal.   Assessment/Plan: 1. Fever and chills - POCT Influenza A/B positive for flu A. Treat with tamiflu 75mg  twice daily for 5 days. Rest and increase fluids. OTC medication to treat acute symptoms.  2. Influenza A Treat with tamiflu 75mg  twice daily for 5 days. Rest and increase fluids. OTC medication to treat acute symptoms. - oseltamivir (TAMIFLU) 75 MG capsule; Take 1 capsule (75 mg total) by mouth 2 (two) times daily.  Dispense: 10 capsule; Refill: 0  3. Urinary tract infection with hematuria, site unspecified Levofloxacin 500mg  daily for 10 days. Send urine for culture and sensitivity and adjust antibiotics as indicated.   levofloxacin (LEVAQUIN) 500 MG tablet; Take 1 tablet (500 mg total) by mouth daily.  Dispense: 10 tablet; Refill: 0  4. Nausea zofran 4mg  as needed for nausea. BRAT diet recommended. Advance diet as tolerated.  - ondansetron (ZOFRAN) 4 MG tablet; Take 1 tablet (4 mg total) by mouth every 8 (eight) hours as needed for nausea or vomiting.  Dispense: 30 tablet; Refill: 2  5. Dysuria - POCT Urinalysis Dipstick - CULTURE, URINE COMPREHENSIVE  General Counseling: Alexandra Yang verbalizes understanding of the findings of todays visit and agrees with plan of treatment. I have discussed any further diagnostic evaluation that may be needed or ordered today. We also reviewed her medications today. she has been encouraged to call the office with any questions or concerns that should arise related to todays visit.    Counseling:  Rest and increase fluids. Continue using OTC medication to control symptoms.   This patient was seen by Leretha Pol FNP Collaboration with Dr Lavera Guise as a part of collaborative care agreement  Orders Placed This Encounter  Procedures  . CULTURE, URINE COMPREHENSIVE  . POCT Influenza A/B  . POCT Urinalysis Dipstick    Meds ordered this encounter  Medications  . levofloxacin (LEVAQUIN) 500 MG tablet    Sig:  Take 1 tablet (500 mg total) by mouth daily.    Dispense:  10 tablet    Refill:  0    Order Specific Question:   Supervising Provider    Answer:   Lavera Guise [[id number]]  . oseltamivir (TAMIFLU) 75 MG capsule    Sig: Take  1 capsule (75 mg total) by mouth 2 (two) times daily.    Dispense:  10 capsule    Refill:  0    Order Specific Question:   Supervising Provider    Answer:   Lavera Guise [[id number]]  . ondansetron (ZOFRAN) 4 MG tablet    Sig: Take 1 tablet (4 mg total) by mouth every 8 (eight) hours as needed for nausea or vomiting.    Dispense:  30 tablet    Refill:  2    Order Specific Question:   Supervising Provider    Answer:   Lavera Guise [[id number]]    Time spent: 25 Minutes

## 2018-07-29 LAB — CULTURE, URINE COMPREHENSIVE

## 2018-07-30 ENCOUNTER — Other Ambulatory Visit: Payer: Self-pay | Admitting: Adult Health

## 2018-07-30 ENCOUNTER — Telehealth: Payer: Self-pay | Admitting: Adult Health

## 2018-07-30 MED ORDER — NITROFURANTOIN MONOHYD MACRO 100 MG PO CAPS: 100.0000 mg | ORAL_CAPSULE | Freq: Two times a day (BID) | ORAL | 0 refills | Status: DC

## 2018-07-30 NOTE — Telephone Encounter (Signed)
-----   Message from Kendell Bane, NP sent at 07/30/2018  8:40 AM EDT ----- Urine grew E. Coli.  No susceptible to the Levaquin that heather put her on.  I sent RX for Macrobid. Please call

## 2018-07-30 NOTE — Progress Notes (Signed)
Pts urine grew  E. Coli, Macrobid sent to patients pharmacy. Pt will be called.

## 2018-07-30 NOTE — Telephone Encounter (Signed)
Patient was called and told about culture results and rx at pharmacy

## 2018-08-03 ENCOUNTER — Telehealth: Payer: Self-pay

## 2018-08-03 ENCOUNTER — Other Ambulatory Visit: Payer: Self-pay | Admitting: Internal Medicine

## 2018-08-03 ENCOUNTER — Other Ambulatory Visit: Payer: Self-pay

## 2018-08-03 MED ORDER — OMEPRAZOLE 40 MG PO CPDR: 40.0000 mg | DELAYED_RELEASE_CAPSULE | Freq: Every day | ORAL | 0 refills | Status: DC

## 2018-08-03 MED ORDER — IPRATROPIUM-ALBUTEROL 0.5-2.5 (3) MG/3ML IN SOLN: RESPIRATORY_TRACT | 1 refills | Status: AC

## 2018-08-03 MED ORDER — PREDNISONE 20 MG PO TABS: ORAL_TABLET | ORAL | 0 refills | Status: DC

## 2018-08-03 NOTE — Telephone Encounter (Signed)
Should we contact Alexandra Yang about her? She was positive for flu, however, she had also just gotten off of a cruise when I saw her.

## 2018-08-03 NOTE — Telephone Encounter (Signed)
Pt advised as per dr Humphrey Rolls we send prednisone 20 bid for 3 days ,prilosec once a daily and duoneb 3 times a day and labslip for cbc and metB ready for pickup and make follow up at friday

## 2018-08-04 ENCOUNTER — Telehealth: Payer: Self-pay

## 2018-08-04 ENCOUNTER — Other Ambulatory Visit: Payer: Self-pay | Admitting: Nurse Practitioner

## 2018-08-04 DIAGNOSIS — R531 Weakness: Secondary | ICD-10-CM | POA: Diagnosis not present

## 2018-08-04 DIAGNOSIS — R5383 Other fatigue: Secondary | ICD-10-CM | POA: Diagnosis not present

## 2018-08-04 NOTE — Telephone Encounter (Signed)
Spoke with  Pt she is feeling much better

## 2018-08-05 LAB — BASIC METABOLIC PANEL
BUN/Creatinine Ratio: 31 — ABNORMAL HIGH (ref 12–28)
BUN: 22 mg/dL (ref 8–27)
CO2: 21 mmol/L (ref 20–29)
Calcium: 10.4 mg/dL — ABNORMAL HIGH (ref 8.7–10.3)
Chloride: 100 mmol/L (ref 96–106)
Creatinine, Ser: 0.72 mg/dL (ref 0.57–1.00)
GFR calc Af Amer: 98 mL/min/{1.73_m2} (ref >59)
GFR calc non Af Amer: 85 mL/min/{1.73_m2} (ref >59)
Glucose: 313 mg/dL — ABNORMAL HIGH (ref 65–99)
Potassium: 5.4 mmol/L — ABNORMAL HIGH (ref 3.5–5.2)
SODIUM: 137 mmol/L (ref 134–144)

## 2018-08-05 LAB — CBC
Hematocrit: 36.1 % (ref 34.0–46.6)
Hemoglobin: 11.7 g/dL (ref 11.1–15.9)
MCH: 29.5 pg (ref 26.6–33.0)
MCHC: 32.4 g/dL (ref 31.5–35.7)
MCV: 91 fL (ref 79–97)
Platelets: 360 10*3/uL (ref 150–450)
RBC: 3.97 x10E6/uL (ref 3.77–5.28)
RDW: 12.1 % (ref 11.7–15.4)
WBC: 11.2 10*3/uL — ABNORMAL HIGH (ref 3.4–10.8)

## 2018-08-06 ENCOUNTER — Encounter: Payer: Self-pay | Admitting: Adult Health

## 2018-08-06 ENCOUNTER — Ambulatory Visit (INDEPENDENT_AMBULATORY_CARE_PROVIDER_SITE_OTHER): Payer: Medicare HMO | Admitting: Adult Health

## 2018-08-06 VITALS — BP 128/58 | HR 112 | Resp 16 | Ht 67.0 in | Wt 127.0 lb

## 2018-08-06 DIAGNOSIS — R509 Fever, unspecified: Secondary | ICD-10-CM

## 2018-08-06 DIAGNOSIS — R319 Hematuria, unspecified: Secondary | ICD-10-CM | POA: Diagnosis not present

## 2018-08-06 DIAGNOSIS — F17200 Nicotine dependence, unspecified, uncomplicated: Secondary | ICD-10-CM

## 2018-08-06 DIAGNOSIS — J101 Influenza due to other identified influenza virus with other respiratory manifestations: Secondary | ICD-10-CM | POA: Diagnosis not present

## 2018-08-06 DIAGNOSIS — N390 Urinary tract infection, site not specified: Secondary | ICD-10-CM | POA: Diagnosis not present

## 2018-08-06 NOTE — Progress Notes (Signed)
[facility] [place], [place] [address]  Internal MEDICINE  Office Visit Note  Patient Name: Alexandra Yang  [id number]  [id number]  Date of Service: 08/17/2018  Chief Complaint  Patient presents with  . Cough    HPI  Pt is here for follow up on cough and flu.  Pt reports she is feeling much better.  She has recovered nicely from the flu and is requesting to go back to work Monday. She denies any new or worsening symptoms. Pt is slightly tachycardic at this time.  She reports this is common for her on prednisone.      Current Medication: Outpatient Encounter Medications as of 08/06/2018  Medication Sig  . albuterol (PROVENTIL) (2.5 MG/3ML) 0.083% nebulizer solution Take 2.5 mg by nebulization every 6 (six) hours as needed for wheezing or shortness of breath.  Marland Kitchen aspirin 81 MG chewable tablet Chew 81 mg by mouth every evening.   . cholecalciferol (VITAMIN D) 1000 UNITS tablet Take 1,000 Units by mouth daily.  Marland Kitchen glucose blood (ACCU-CHEK GUIDE) test strip Blood sugars tested QD and prn.  Dx - E11.65  . ibuprofen (ADVIL,MOTRIN) 800 MG tablet Take 1 tablet (800 mg total) by mouth 3 (three) times daily as needed.  . Insulin Pen Needle 32G X 4 MM MISC USE AS ONCE DAILY  . ipratropium-albuterol (DUONEB) 0.5-2.5 (3) MG/3ML SOLN Use as directed three time a day diag j44.9  . lansoprazole (PREVACID) 30 MG capsule Take 30 mg by mouth daily at 12 noon.  Marland Kitchen lisinopril (PRINIVIL,ZESTRIL) 10 MG tablet Take 1 tablet (10 mg total) by mouth daily.  . metFORMIN (GLUCOPHAGE-XR) 500 MG 24 hr tablet TAKE 1 TABLET EVERY NIGHT  . Multiple Vitamins-Minerals (CENTRUM SILVER PO) Take 1 tablet by mouth daily.  . nitrofurantoin, macrocrystal-monohydrate, (MACROBID) 100 MG capsule Take 1 capsule (100 mg total) by mouth 2 (two) times daily.  . ondansetron (ZOFRAN ODT) 4 MG disintegrating tablet Take 1 tablet (4 mg total) by mouth every 8 (eight) hours as needed for nausea or vomiting.  .  ondansetron (ZOFRAN) 4 MG tablet Take 1 tablet (4 mg total) by mouth every 8 (eight) hours as needed for nausea or vomiting.  . pregabalin (LYRICA) 75 MG capsule Take 75 mg by mouth at bedtime as needed (pain).   . Semaglutide,0.25 or 0.5MG /DOS, (OZEMPIC, 0.25 OR 0.5 MG/DOSE,) 2 MG/1.5ML SOPN Inject 0.5 mg into the skin once a week.  . simvastatin (ZOCOR) 40 MG tablet Take 1 tablet (40 mg total) by mouth at bedtime.  . vitamin B-12 (CYANOCOBALAMIN) 1000 MCG tablet Take 1,000 mcg by mouth daily.   Marland Kitchen HYDROcodone-acetaminophen (NORCO/VICODIN) 5-325 MG tablet Take 1 tablet by mouth every 6 (six) hours as needed for moderate pain. (Patient not taking: Reported on 06/30/2018)  . Insulin Glargine (TOUJEO MAX SOLOSTAR) 300 UNIT/ML SOPN Inject 24 Units into the skin 1 day or 1 dose for 1 dose.  . [DISCONTINUED] benzonatate (TESSALON) 200 MG capsule Take 1 capsule (200 mg total) by mouth 3 (three) times daily as needed for cough. (Patient not taking: Reported on 08/06/2018)  . [DISCONTINUED] cyclobenzaprine (FLEXERIL) 10 MG tablet Take 1 tablet (10 mg total) by mouth 3 (three) times daily as needed for muscle spasms. (Patient not taking: Reported on 06/30/2018)  . [DISCONTINUED] ertapenem 1 g in sodium chloride 0.9 % 50 mL Inject 1 g into the vein daily. (Patient not taking: Reported on 05/20/2018)  . [DISCONTINUED] hydrOXYzine (ATARAX/VISTARIL) 10 MG tablet Take 1 tablet (  10 mg total) by mouth 3 (three) times daily as needed for itching. (Patient not taking: Reported on 08/06/2018)  . [DISCONTINUED] levofloxacin (LEVAQUIN) 500 MG tablet Take 1 tablet (500 mg total) by mouth daily. (Patient not taking: Reported on 08/06/2018)  . [DISCONTINUED] lidocaine (LIDODERM) 5 % Place 1 patch onto the skin every 12 (twelve) hours. Remove & Discard patch within 12 hours or as directed by MD (Patient not taking: Reported on 08/06/2018)  . [DISCONTINUED] metoCLOPramide (REGLAN) 10 MG tablet Take 1 tablet (10 mg total) by mouth daily  as needed. (Patient not taking: Reported on 08/06/2018)  . [DISCONTINUED] omeprazole (PRILOSEC) 40 MG capsule TAKE 1 CAPSULE(40 MG) BY MOUTH DAILY (Patient not taking: Reported on 08/06/2018)  . [DISCONTINUED] oseltamivir (TAMIFLU) 75 MG capsule Take 1 capsule (75 mg total) by mouth 2 (two) times daily. (Patient not taking: Reported on 08/06/2018)  . [DISCONTINUED] predniSONE (DELTASONE) 20 MG tablet Take 1 tab po BID for  3 days (Patient not taking: Reported on 08/06/2018)   No facility-administered encounter medications on file as of 08/06/2018.     Surgical History: Past Surgical History:  Procedure Laterality Date  . ABDOMINAL HYSTERECTOMY    . CHOLECYSTECTOMY    . KNEE ARTHROSCOPY Right   . TENDON TRANSFER Right 01/20/2015   Procedure: RIGHT HAND AND MIDDLE FINGER TENDON RECONSTRUCTION AND TRANSFER;  Surgeon: Roseanne Kaufman, MD;  Location: [facility];  Service: Orthopedics;  Laterality: Right;    Medical History: Past Medical History:  Diagnosis Date  . Arthritis    osteoarthritis  . Cancer ([place]) 1995   endometrial ca  . COPD (chronic obstructive pulmonary disease) ([place])   . Diabetes mellitus without complication ([place])    type 2  . GERD (gastroesophageal reflux disease)   . History of kidney infection    had to have PICC line  . Neuropathy    both feet  . Osteopenia   . Osteoporosis   . Pneumonia     Family History: Family History  Problem Relation Age of Onset  . Pancreatic cancer Mother   . Cancer Mother        pancreaitis  . Lung cancer Father   . Breast cancer Sister 56    Social History   Socioeconomic History  . Marital status: Widowed    Spouse name: Not on file  . Number of children: Not on file  . Years of education: Not on file  . Highest education level: Not on file  Occupational History  . Not on file  Social Needs  . Financial resource strain: Not on file  . Food insecurity:    Worry: Not on file    Inability: Not on file  . Transportation needs:     Medical: Not on file    Non-medical: Not on file  Tobacco Use  . Smoking status: Current Every Day Smoker    Packs/day: 0.50    Years: 40.00    Pack years: 20.00    Types: Cigarettes  . Smokeless tobacco: Never Used  Substance and Sexual Activity  . Alcohol use: Yes    Comment: occasional  . Drug use: No  . Sexual activity: Not on file  Lifestyle  . Physical activity:    Days per week: Not on file    Minutes per session: Not on file  . Stress: Not on file  Relationships  . Social connections:    Talks on phone: Not on file    Gets together: Not on file  Attends religious service: Not on file    Active member of club or organization: Not on file    Attends meetings of clubs or organizations: Not on file    Relationship status: Not on file  . Intimate partner violence:    Fear of current or ex partner: Not on file    Emotionally abused: Not on file    Physically abused: Not on file    Forced sexual activity: Not on file  Other Topics Concern  . Not on file  Social History Narrative  . Not on file      Review of Systems  Constitutional: Negative for chills, fatigue and unexpected weight change.  HENT: Negative for congestion, rhinorrhea, sneezing and sore throat.   Eyes: Negative for photophobia, pain and redness.  Respiratory: Negative for cough, chest tightness and shortness of breath.   Cardiovascular: Negative for chest pain and palpitations.  Gastrointestinal: Negative for abdominal pain, constipation, diarrhea, nausea and vomiting.  Endocrine: Negative.   Genitourinary: Negative for dysuria and frequency.  Musculoskeletal: Negative for arthralgias, back pain, joint swelling and neck pain.  Skin: Negative for rash.  Allergic/Immunologic: Negative.   Neurological: Negative for tremors and numbness.  Hematological: Negative for adenopathy. Does not bruise/bleed easily.  Psychiatric/Behavioral: Negative for behavioral problems and sleep disturbance. The  patient is not nervous/anxious.     Vital Signs: BP (!) 128/58   Pulse (!) 112   Resp 16   Ht 5\' 7"  (1.702 m)   Wt 127 lb (57.6 kg)   SpO2 95%   BMI 19.89 kg/m    Physical Exam Vitals signs and nursing note reviewed.  Constitutional:      General: She is not in acute distress.    Appearance: She is well-developed. She is not diaphoretic.  HENT:     Head: Normocephalic and atraumatic.     Mouth/Throat:     Pharynx: No oropharyngeal exudate.  Eyes:     Pupils: Pupils are equal, round, and reactive to light.  Neck:     Musculoskeletal: Normal range of motion and neck supple.     Thyroid: No thyromegaly.     Vascular: No JVD.     Trachea: No tracheal deviation.  Cardiovascular:     Rate and Rhythm: Normal rate and regular rhythm.     Heart sounds: Normal heart sounds. No murmur. No friction rub. No gallop.   Pulmonary:     Effort: Pulmonary effort is normal. No respiratory distress.     Breath sounds: Normal breath sounds. No wheezing or rales.  Chest:     Chest wall: No tenderness.  Abdominal:     Palpations: Abdomen is soft.     Tenderness: There is no abdominal tenderness. There is no guarding.  Musculoskeletal: Normal range of motion.  Lymphadenopathy:     Cervical: No cervical adenopathy.  Skin:    General: Skin is warm and dry.  Neurological:     Mental Status: She is alert and oriented to person, place, and time.     Cranial Nerves: No cranial nerve deficit.  Psychiatric:        Behavior: Behavior normal.        Thought Content: Thought content normal.        Judgment: Judgment normal.    Assessment/Plan: 1. Influenza A Resolved, pt feeling better.    2. Fever and chills She has been afebrile for at least 4 days. Doing well, and appears to be recovering.    3. Urinary  tract infection with hematuria, site unspecified Pt will finish Macrobid course tomorrow. Continue to take meds as directed.  Follow up if symptoms return.  Patients repeat HR found to  be 98.  Likely elevated due to infection.   General Counseling: quamesha mullet understanding of the findings of todays visit and agrees with plan of treatment. I have discussed any further diagnostic evaluation that may be needed or ordered today. We also reviewed her medications today. she has been encouraged to call the office with any questions or concerns that should arise related to todays visit.    No orders of the defined types were placed in this encounter.   No orders of the defined types were placed in this encounter.   Time spent: 25 Minutes   This patient was seen by Orson Gear AGNP-C in Collaboration with Dr Lavera Guise as a part of collaborative care agreement     Kendell Bane AGNP-C Internal medicine

## 2018-08-06 NOTE — Patient Instructions (Signed)
Coping with Quitting Smoking  Quitting smoking is a physical and mental challenge. You will face cravings, withdrawal symptoms, and temptation. Before quitting, work with your health care provider to make a plan that can help you cope. Preparation can help you quit and keep you from giving in. How can I cope with cravings? Cravings usually last for 5-10 minutes. If you get through it, the craving will pass. Consider taking the following actions to help you cope with cravings:  Keep your mouth busy: ? Chew sugar-free gum. ? Suck on hard candies or a straw. ? Brush your teeth.  Keep your hands and body busy: ? Immediately change to a different activity when you feel a craving. ? Squeeze or play with a ball. ? Do an activity or a hobby, like making bead jewelry, practicing needlepoint, or working with wood. ? Mix up your normal routine. ? Take a short exercise break. Go for a quick walk or run up and down stairs. ? Spend time in public places where smoking is not allowed.  Focus on doing something kind or helpful for someone else.  Call a friend or family member to talk during a craving.  Join a support group.  Call a quit line, such as 1-800-QUIT-NOW.  Talk with your health care provider about medicines that might help you cope with cravings and make quitting easier for you. How can I deal with withdrawal symptoms? Your body may experience negative effects as it tries to get used to not having nicotine in the system. These effects are called withdrawal symptoms. They may include:  Feeling hungrier than normal.  Trouble concentrating.  Irritability.  Trouble sleeping.  Feeling depressed.  Restlessness and agitation.  Craving a cigarette. To manage withdrawal symptoms:  Avoid places, people, and activities that trigger your cravings.  Remember why you want to quit.  Get plenty of sleep.  Avoid coffee and other caffeinated drinks. These may worsen some of your symptoms.  How can I handle social situations? Social situations can be difficult when you are quitting smoking, especially in the first few weeks. To manage this, you can:  Avoid parties, bars, and other social situations where people might be smoking.  Avoid alcohol.  Leave right away if you have the urge to smoke.  Explain to your family and friends that you are quitting smoking. Ask for understanding and support.  Plan activities with friends or family where smoking is not an option. What are some ways I can cope with stress? Wanting to smoke may cause stress, and stress can make you want to smoke. Find ways to manage your stress. Relaxation techniques can help. For example:  Breathe slowly and deeply, in through your nose and out through your mouth.  Listen to soothing, relaxing music.  Talk with a family member or friend about your stress.  Light a candle.  Soak in a bath or take a shower.  Think about a peaceful place. What are some ways I can prevent weight gain? Be aware that many people gain weight after they quit smoking. However, not everyone does. To keep from gaining weight, have a plan in place before you quit and stick to the plan after you quit. Your plan should include:  Having healthy snacks. When you have a craving, it may help to: ? Eat plain popcorn, crunchy carrots, celery, or other cut vegetables. ? Chew sugar-free gum.  Changing how you eat: ? Eat small portion sizes at meals. ? Eat 4-6 small meals   throughout the day instead of 1-2 large meals a day. ? Be mindful when you eat. Do not watch television or do other things that might distract you as you eat.  Exercising regularly: ? Make time to exercise each day. If you do not have time for a long workout, do short bouts of exercise for 5-10 minutes several times a day. ? Do some form of strengthening exercise, like weight lifting, and some form of aerobic exercise, like running or swimming.  Drinking plenty of  water or other low-calorie or no-calorie drinks. Drink 6-8 glasses of water daily, or as much as instructed by your health care provider. Summary  Quitting smoking is a physical and mental challenge. You will face cravings, withdrawal symptoms, and temptation to smoke again. Preparation can help you as you go through these challenges.  You can cope with cravings by keeping your mouth busy (such as by chewing gum), keeping your body and hands busy, and making calls to family, friends, or a helpline for people who want to quit smoking.  You can cope with withdrawal symptoms by avoiding places where people smoke, avoiding drinks with caffeine, and getting plenty of rest.  Ask your health care provider about the different ways to prevent weight gain, avoid stress, and handle social situations. This information is not intended to replace advice given to you by your health care provider. Make sure you discuss any questions you have with your health care provider. Document Released: 05/02/2016 Document Revised: 05/02/2016 Document Reviewed: 05/02/2016 Elsevier Interactive Patient Education  2019 Elsevier Inc.  

## 2018-08-19 ENCOUNTER — Other Ambulatory Visit: Payer: Self-pay

## 2018-08-19 ENCOUNTER — Encounter: Payer: Self-pay | Admitting: Adult Health

## 2018-08-19 ENCOUNTER — Ambulatory Visit (INDEPENDENT_AMBULATORY_CARE_PROVIDER_SITE_OTHER): Payer: Medicare HMO | Admitting: Adult Health

## 2018-08-19 VITALS — BP 123/76 | HR 96 | Resp 16 | Ht 67.0 in | Wt 128.6 lb

## 2018-08-19 DIAGNOSIS — E782 Mixed hyperlipidemia: Secondary | ICD-10-CM

## 2018-08-19 DIAGNOSIS — E1142 Type 2 diabetes mellitus with diabetic polyneuropathy: Secondary | ICD-10-CM

## 2018-08-19 DIAGNOSIS — I10 Essential (primary) hypertension: Secondary | ICD-10-CM

## 2018-08-19 DIAGNOSIS — F17200 Nicotine dependence, unspecified, uncomplicated: Secondary | ICD-10-CM

## 2018-08-19 DIAGNOSIS — E1165 Type 2 diabetes mellitus with hyperglycemia: Secondary | ICD-10-CM

## 2018-08-19 LAB — POCT GLYCOSYLATED HEMOGLOBIN (HGB A1C): Hemoglobin A1C: 8.9 % — AB (ref 4.0–5.6)

## 2018-08-19 MED ORDER — SEMAGLUTIDE(0.25 OR 0.5MG/DOS) 2 MG/1.5ML ~~LOC~~ SOPN: 1.0000 mg | PEN_INJECTOR | SUBCUTANEOUS | 5 refills | Status: DC

## 2018-08-19 NOTE — Patient Instructions (Signed)
Diabetes Mellitus and Nutrition, Adult  When you have diabetes (diabetes mellitus), it is very important to have healthy eating habits because your blood sugar (glucose) levels are greatly affected by what you eat and drink. Eating healthy foods in the appropriate amounts, at about the same times every day, can help you:  · Control your blood glucose.  · Lower your risk of heart disease.  · Improve your blood pressure.  · Reach or maintain a healthy weight.  Every person with diabetes is different, and each person has different needs for a meal plan. Your health care provider may recommend that you work with a diet and nutrition specialist (dietitian) to make a meal plan that is best for you. Your meal plan may vary depending on factors such as:  · The calories you need.  · The medicines you take.  · Your weight.  · Your blood glucose, blood pressure, and cholesterol levels.  · Your activity level.  · Other health conditions you have, such as heart or kidney disease.  How do carbohydrates affect me?  Carbohydrates, also called carbs, affect your blood glucose level more than any other type of food. Eating carbs naturally raises the amount of glucose in your blood. Carb counting is a method for keeping track of how many carbs you eat. Counting carbs is important to keep your blood glucose at a healthy level, especially if you use insulin or take certain oral diabetes medicines.  It is important to know how many carbs you can safely have in each meal. This is different for every person. Your dietitian can help you calculate how many carbs you should have at each meal and for each snack.  Foods that contain carbs include:  · Bread, cereal, rice, pasta, and crackers.  · Potatoes and corn.  · Peas, beans, and lentils.  · Milk and yogurt.  · Fruit and juice.  · Desserts, such as cakes, cookies, ice cream, and candy.  How does alcohol affect me?  Alcohol can cause a sudden decrease in blood glucose (hypoglycemia),  especially if you use insulin or take certain oral diabetes medicines. Hypoglycemia can be a life-threatening condition. Symptoms of hypoglycemia (sleepiness, dizziness, and confusion) are similar to symptoms of having too much alcohol.  If your health care provider says that alcohol is safe for you, follow these guidelines:  · Limit alcohol intake to no more than 1 drink per day for nonpregnant women and 2 drinks per day for men. One drink equals 12 oz of beer, 5 oz of wine, or 1½ oz of hard liquor.  · Do not drink on an empty stomach.  · Keep yourself hydrated with water, diet soda, or unsweetened iced tea.  · Keep in mind that regular soda, juice, and other mixers may contain a lot of sugar and must be counted as carbs.  What are tips for following this plan?    Reading food labels  · Start by checking the serving size on the "Nutrition Facts" label of packaged foods and drinks. The amount of calories, carbs, fats, and other nutrients listed on the label is based on one serving of the item. Many items contain more than one serving per package.  · Check the total grams (g) of carbs in one serving. You can calculate the number of servings of carbs in one serving by dividing the total carbs by 15. For example, if a food has 30 g of total carbs, it would be equal to 2   servings of carbs.  · Check the number of grams (g) of saturated and trans fats in one serving. Choose foods that have low or no amount of these fats.  · Check the number of milligrams (mg) of salt (sodium) in one serving. Most people should limit total sodium intake to less than 2,300 mg per day.  · Always check the nutrition information of foods labeled as "low-fat" or "nonfat". These foods may be higher in added sugar or refined carbs and should be avoided.  · Talk to your dietitian to identify your daily goals for nutrients listed on the label.  Shopping  · Avoid buying canned, premade, or processed foods. These foods tend to be high in fat, sodium,  and added sugar.  · Shop around the outside edge of the grocery store. This includes fresh fruits and vegetables, bulk grains, fresh meats, and fresh dairy.  Cooking  · Use low-heat cooking methods, such as baking, instead of high-heat cooking methods like deep frying.  · Cook using healthy oils, such as olive, canola, or sunflower oil.  · Avoid cooking with butter, cream, or high-fat meats.  Meal planning  · Eat meals and snacks regularly, preferably at the same times every day. Avoid going long periods of time without eating.  · Eat foods high in fiber, such as fresh fruits, vegetables, beans, and whole grains. Talk to your dietitian about how many servings of carbs you can eat at each meal.  · Eat 4-6 ounces (oz) of lean protein each day, such as lean meat, chicken, fish, eggs, or tofu. One oz of lean protein is equal to:  ? 1 oz of meat, chicken, or fish.  ? 1 egg.  ? ¼ cup of tofu.  · Eat some foods each day that contain healthy fats, such as avocado, nuts, seeds, and fish.  Lifestyle  · Check your blood glucose regularly.  · Exercise regularly as told by your health care provider. This may include:  ? 150 minutes of moderate-intensity or vigorous-intensity exercise each week. This could be brisk walking, biking, or water aerobics.  ? Stretching and doing strength exercises, such as yoga or weightlifting, at least 2 times a week.  · Take medicines as told by your health care provider.  · Do not use any products that contain nicotine or tobacco, such as cigarettes and e-cigarettes. If you need help quitting, ask your health care provider.  · Work with a counselor or diabetes educator to identify strategies to manage stress and any emotional and social challenges.  Questions to ask a health care provider  · Do I need to meet with a diabetes educator?  · Do I need to meet with a dietitian?  · What number can I call if I have questions?  · When are the best times to check my blood glucose?  Where to find more  information:  · American Diabetes Association: diabetes.org  · Academy of Nutrition and Dietetics: www.eatright.org  · National Institute of Diabetes and Digestive and Kidney Diseases (NIH): www.niddk.nih.gov  Summary  · A healthy meal plan will help you control your blood glucose and maintain a healthy lifestyle.  · Working with a diet and nutrition specialist (dietitian) can help you make a meal plan that is best for you.  · Keep in mind that carbohydrates (carbs) and alcohol have immediate effects on your blood glucose levels. It is important to count carbs and to use alcohol carefully.  This information is not intended to   replace advice given to you by your health care provider. Make sure you discuss any questions you have with your health care provider.  Document Released: 01/30/2005 Document Revised: 12/03/2016 Document Reviewed: 06/09/2016  Elsevier Interactive Patient Education © 2019 [employer]vier Inc.

## 2018-08-19 NOTE — Progress Notes (Signed)
[facility] [place], [place] [address]  Internal MEDICINE  Office Visit Note  Patient Name: Alexandra Yang  [id number]  [id number]  Date of Service: 09/28/2018  No chief complaint on file.   HPI  Pt is here for follow up on DM.  Her A1C today is 8.9.  Will need to increase her current medications.  She declines to change any of her medications at this time.  Pt also has history of HTN and HLD that is well controlled at this time.  She denies Chest pain, Shortness of breath, palpitations, headache, or blurred vision.      Current Medication: Outpatient Encounter Medications as of 08/19/2018  Medication Sig  . albuterol (PROVENTIL) (2.5 MG/3ML) 0.083% nebulizer solution Take 2.5 mg by nebulization every 6 (six) hours as needed for wheezing or shortness of breath.  Marland Kitchen aspirin 81 MG chewable tablet Chew 81 mg by mouth every evening.   . cholecalciferol (VITAMIN D) 1000 UNITS tablet Take 1,000 Units by mouth daily.  Marland Kitchen glucose blood (ACCU-CHEK GUIDE) test strip Blood sugars tested QD and prn.  Dx - E11.65  . Insulin Pen Needle 32G X 4 MM MISC USE AS ONCE DAILY  . ipratropium-albuterol (DUONEB) 0.5-2.5 (3) MG/3ML SOLN Use as directed three time a day diag j44.9  . lansoprazole (PREVACID) 30 MG capsule Take 30 mg by mouth daily at 12 noon.  Marland Kitchen lisinopril (PRINIVIL,ZESTRIL) 10 MG tablet Take 1 tablet (10 mg total) by mouth daily.  . metFORMIN (GLUCOPHAGE-XR) 500 MG 24 hr tablet TAKE 1 TABLET EVERY NIGHT  . Multiple Vitamins-Minerals (CENTRUM SILVER PO) Take 1 tablet by mouth daily.  . nitrofurantoin, macrocrystal-monohydrate, (MACROBID) 100 MG capsule Take 1 capsule (100 mg total) by mouth 2 (two) times daily.  . ondansetron (ZOFRAN ODT) 4 MG disintegrating tablet Take 1 tablet (4 mg total) by mouth every 8 (eight) hours as needed for nausea or vomiting.  . ondansetron (ZOFRAN) 4 MG tablet Take 1 tablet (4 mg total) by mouth every 8 (eight) hours as needed for nausea or  vomiting.  . pregabalin (LYRICA) 75 MG capsule Take 75 mg by mouth at bedtime as needed (pain).   . simvastatin (ZOCOR) 40 MG tablet Take 1 tablet (40 mg total) by mouth at bedtime.  . vitamin B-12 (CYANOCOBALAMIN) 1000 MCG tablet Take 1,000 mcg by mouth daily.   . [DISCONTINUED] ibuprofen (ADVIL,MOTRIN) 800 MG tablet Take 1 tablet (800 mg total) by mouth 3 (three) times daily as needed.  . [DISCONTINUED] Semaglutide,0.25 or 0.5MG /DOS, (OZEMPIC, 0.25 OR 0.5 MG/DOSE,) 2 MG/1.5ML SOPN Inject 0.5 mg into the skin once a week.  . [DISCONTINUED] Semaglutide,0.25 or 0.5MG /DOS, (OZEMPIC, 0.25 OR 0.5 MG/DOSE,) 2 MG/1.5ML SOPN Inject 1 mg into the skin once a week.  Marland Kitchen HYDROcodone-acetaminophen (NORCO/VICODIN) 5-325 MG tablet Take 1 tablet by mouth every 6 (six) hours as needed for moderate pain. (Patient not taking: Reported on 08/19/2018)  . Insulin Glargine (TOUJEO MAX SOLOSTAR) 300 UNIT/ML SOPN Inject 24 Units into the skin 1 day or 1 dose for 1 dose.   No facility-administered encounter medications on file as of 08/19/2018.     Surgical History: Past Surgical History:  Procedure Laterality Date  . ABDOMINAL HYSTERECTOMY    . CHOLECYSTECTOMY    . KNEE ARTHROSCOPY Right   . TENDON TRANSFER Right 01/20/2015   Procedure: RIGHT HAND AND MIDDLE FINGER TENDON RECONSTRUCTION AND TRANSFER;  Surgeon: Roseanne Kaufman, MD;  Location: [facility];  Service: Orthopedics;  Laterality: Right;  Medical History: Past Medical History:  Diagnosis Date  . Arthritis    osteoarthritis  . Cancer ([place]) 1995   endometrial ca  . COPD (chronic obstructive pulmonary disease) ([place])   . Diabetes mellitus without complication ([place])    type 2  . GERD (gastroesophageal reflux disease)   . History of kidney infection    had to have PICC line  . Neuropathy    both feet  . Osteopenia   . Osteoporosis   . Pneumonia     Family History: Family History  Problem Relation Age of Onset  . Pancreatic cancer Mother   . Cancer  Mother        pancreaitis  . Lung cancer Father   . Breast cancer Sister 53    Social History   Socioeconomic History  . Marital status: Widowed    Spouse name: Not on file  . Number of children: Not on file  . Years of education: Not on file  . Highest education level: Not on file  Occupational History  . Not on file  Social Needs  . Financial resource strain: Not on file  . Food insecurity:    Worry: Not on file    Inability: Not on file  . Transportation needs:    Medical: Not on file    Non-medical: Not on file  Tobacco Use  . Smoking status: Current Every Day Smoker    Packs/day: 0.50    Years: 40.00    Pack years: 20.00    Types: Cigarettes  . Smokeless tobacco: Never Used  Substance and Sexual Activity  . Alcohol use: Yes    Comment: occasional  . Drug use: No  . Sexual activity: Not on file  Lifestyle  . Physical activity:    Days per week: Not on file    Minutes per session: Not on file  . Stress: Not on file  Relationships  . Social connections:    Talks on phone: Not on file    Gets together: Not on file    Attends religious service: Not on file    Active member of club or organization: Not on file    Attends meetings of clubs or organizations: Not on file    Relationship status: Not on file  . Intimate partner violence:    Fear of current or ex partner: Not on file    Emotionally abused: Not on file    Physically abused: Not on file    Forced sexual activity: Not on file  Other Topics Concern  . Not on file  Social History Narrative  . Not on file      Review of Systems  Constitutional: Negative for chills, fatigue and unexpected weight change.  HENT: Negative for congestion, rhinorrhea, sneezing and sore throat.   Eyes: Negative for photophobia, pain and redness.  Respiratory: Negative for cough, chest tightness and shortness of breath.   Cardiovascular: Negative for chest pain and palpitations.  Gastrointestinal: Negative for abdominal  pain, constipation, diarrhea, nausea and vomiting.  Endocrine: Negative.   Genitourinary: Negative for dysuria and frequency.  Musculoskeletal: Negative for arthralgias, back pain, joint swelling and neck pain.  Skin: Negative for rash.  Allergic/Immunologic: Negative.   Neurological: Negative for tremors and numbness.  Hematological: Negative for adenopathy. Does not bruise/bleed easily.  Psychiatric/Behavioral: Negative for behavioral problems and sleep disturbance. The patient is not nervous/anxious.     Vital Signs: BP 123/76   Pulse 96   Resp 16   Ht  5\' 7"  (1.702 m)   Wt 128 lb 9.6 oz (58.3 kg)   SpO2 96%   BMI 20.14 kg/m    Physical Exam Vitals signs and nursing note reviewed.  Constitutional:      General: She is not in acute distress.    Appearance: She is well-developed. She is not diaphoretic.  HENT:     Head: Normocephalic and atraumatic.     Mouth/Throat:     Pharynx: No oropharyngeal exudate.  Eyes:     Pupils: Pupils are equal, round, and reactive to light.  Neck:     Musculoskeletal: Normal range of motion and neck supple.     Thyroid: No thyromegaly.     Vascular: No JVD.     Trachea: No tracheal deviation.  Cardiovascular:     Rate and Rhythm: Normal rate and regular rhythm.     Heart sounds: Normal heart sounds. No murmur. No friction rub. No gallop.   Pulmonary:     Effort: Pulmonary effort is normal. No respiratory distress.     Breath sounds: Normal breath sounds. No wheezing or rales.  Chest:     Chest wall: No tenderness.  Abdominal:     Palpations: Abdomen is soft.     Tenderness: There is no abdominal tenderness. There is no guarding.  Musculoskeletal: Normal range of motion.  Lymphadenopathy:     Cervical: No cervical adenopathy.  Skin:    General: Skin is warm and dry.  Neurological:     Mental Status: She is alert and oriented to person, place, and time.     Cranial Nerves: No cranial nerve deficit.  Psychiatric:        Behavior:  Behavior normal.        Thought Content: Thought content normal.        Judgment: Judgment normal.     Assessment/Plan: 1. Uncontrolled type 2 diabetes mellitus with hyperglycemia (HCC) Increase Ozempic to 1mg  weekly. And increase toujeo to 12mg  - POCT HgB A1C  2. Essential hypertension Stable, continue present management.    3. Nicotine dependence with current use Smoking cessation counseling: 1. Pt acknowledges the risks of long term smoking, she will try to quite smoking. 2. Options for different medications including nicotine products, chewing gum, patch etc, Wellbutrin and Chantix is discussed 3. Goal and date of compete cessation is discussed 4. Total time spent in smoking cessation is 15 min.  4. Mixed hyperlipidemia Controlled at this time.  Will repeat lipid panel at next visit.   General Counseling: odetta forness understanding of the findings of todays visit and agrees with plan of treatment. I have discussed any further diagnostic evaluation that may be needed or ordered today. We also reviewed her medications today. she has been encouraged to call the office with any questions or concerns that should arise related to todays visit.    Orders Placed This Encounter  Procedures  . POCT HgB A1C    Meds ordered this encounter  Medications  . DISCONTD: Semaglutide,0.25 or 0.5MG /DOS, (OZEMPIC, 0.25 OR 0.5 MG/DOSE,) 2 MG/1.5ML SOPN    Sig: Inject 1 mg into the skin once a week.    Dispense:  2 pen    Refill:  5    Please d/c victoza. A sample of ozempic was given today.    Time spent: 25 Minutes   This patient was seen by Orson Gear AGNP-C in Collaboration with Dr Lavera Guise as a part of collaborative care agreement     Quita Skye  Lemar Lofty AGNP-C Internal medicine

## 2018-08-24 ENCOUNTER — Other Ambulatory Visit: Payer: Self-pay

## 2018-08-24 ENCOUNTER — Other Ambulatory Visit: Payer: Self-pay | Admitting: Adult Health

## 2018-08-24 MED ORDER — IBUPROFEN 800 MG PO TABS: 800.0000 mg | ORAL_TABLET | Freq: Three times a day (TID) | ORAL | 2 refills | Status: DC | PRN

## 2018-08-24 MED ORDER — SEMAGLUTIDE (1 MG/DOSE) 2 MG/1.5ML ~~LOC~~ SOPN: 1.0000 mg | PEN_INJECTOR | SUBCUTANEOUS | 4 refills | Status: DC

## 2018-09-28 ENCOUNTER — Encounter: Payer: Self-pay | Admitting: Adult Health

## 2018-11-04 ENCOUNTER — Other Ambulatory Visit: Payer: Self-pay

## 2018-11-04 ENCOUNTER — Ambulatory Visit (INDEPENDENT_AMBULATORY_CARE_PROVIDER_SITE_OTHER): Payer: Medicare HMO | Admitting: Nurse Practitioner

## 2018-11-04 ENCOUNTER — Encounter: Payer: Self-pay | Admitting: Nurse Practitioner

## 2018-11-04 VITALS — BP 156/78 | HR 84 | Resp 16 | Ht 67.0 in | Wt 128.2 lb

## 2018-11-04 DIAGNOSIS — E1165 Type 2 diabetes mellitus with hyperglycemia: Secondary | ICD-10-CM

## 2018-11-04 DIAGNOSIS — Z0001 Encounter for general adult medical examination with abnormal findings: Secondary | ICD-10-CM | POA: Diagnosis not present

## 2018-11-04 DIAGNOSIS — I10 Essential (primary) hypertension: Secondary | ICD-10-CM

## 2018-11-04 DIAGNOSIS — E782 Mixed hyperlipidemia: Secondary | ICD-10-CM

## 2018-11-04 DIAGNOSIS — R300 Dysuria: Secondary | ICD-10-CM

## 2018-11-04 DIAGNOSIS — Z1239 Encounter for other screening for malignant neoplasm of breast: Secondary | ICD-10-CM

## 2018-11-04 DIAGNOSIS — K219 Gastro-esophageal reflux disease without esophagitis: Secondary | ICD-10-CM

## 2018-11-04 DIAGNOSIS — E1142 Type 2 diabetes mellitus with diabetic polyneuropathy: Secondary | ICD-10-CM

## 2018-11-04 LAB — POCT GLYCOSYLATED HEMOGLOBIN (HGB A1C): Hemoglobin A1C: 7.3 % — AB (ref 4.0–5.6)

## 2018-11-04 MED ORDER — SIMVASTATIN 40 MG PO TABS: 40.0000 mg | ORAL_TABLET | Freq: Every day | ORAL | 3 refills | Status: DC

## 2018-11-04 MED ORDER — PREGABALIN 75 MG PO CAPS: 75.0000 mg | ORAL_CAPSULE | Freq: Every evening | ORAL | 3 refills | Status: DC | PRN

## 2018-11-04 MED ORDER — LANSOPRAZOLE 30 MG PO CPDR: 30.0000 mg | DELAYED_RELEASE_CAPSULE | Freq: Every day | ORAL | 1 refills | Status: DC

## 2018-11-04 NOTE — Progress Notes (Signed)
Pt blood pressure elevated, taken twice 1st reading 163/79 2nd reading 156/78 Informed provider

## 2018-11-04 NOTE — Progress Notes (Signed)
[facility] [place], [place] [address]  Internal MEDICINE  Office Visit Note  Patient Name: Alexandra Yang  [id number]  [id number]  Date of Service: 11/06/2018   Pt is here for routine health maintenance examination  Chief Complaint  Patient presents with  . Medicare Wellness    pt need rx for diabetic shoes, pt have concerns about medication, pt stays cold all the time  . Diabetes    A1C  . Gastroesophageal Reflux  . Osteoarthritis     The patient is here for routine health maintenance exam. Blood sugars are doing very well. HgbA1c is 7.3 today, down from 8.8 at her last visit. Does have peripheral neuropathy in both feet. Pain is in balls of her feet back to the arches. Feels like needles poking into her skin. She does need to have diabetic shoes.     Current Medication: Outpatient Encounter Medications as of 11/04/2018  Medication Sig  . albuterol (PROVENTIL) (2.5 MG/3ML) 0.083% nebulizer solution Take 2.5 mg by nebulization every 6 (six) hours as needed for wheezing or shortness of breath.  Marland Kitchen aspirin 81 MG chewable tablet Chew 81 mg by mouth every evening.   . cholecalciferol (VITAMIN D) 1000 UNITS tablet Take 1,000 Units by mouth daily.  Marland Kitchen CRANBERRY PO Take by mouth.  Marland Kitchen glucose blood (ACCU-CHEK GUIDE) test strip Blood sugars tested QD and prn.  Dx - E11.65  . ibuprofen (ADVIL,MOTRIN) 800 MG tablet Take 1 tablet (800 mg total) by mouth 3 (three) times daily as needed.  . Insulin Pen Needle 32G X 4 MM MISC USE AS ONCE DAILY  . ipratropium-albuterol (DUONEB) 0.5-2.5 (3) MG/3ML SOLN Use as directed three time a day diag j44.9  . lansoprazole (PREVACID) 30 MG capsule Take 1 capsule (30 mg total) by mouth daily at 12 noon.  Marland Kitchen lisinopril (PRINIVIL,ZESTRIL) 10 MG tablet Take 1 tablet (10 mg total) by mouth daily.  . metFORMIN (GLUCOPHAGE-XR) 500 MG 24 hr tablet TAKE 1 TABLET EVERY NIGHT  . Multiple Vitamins-Minerals (CENTRUM SILVER PO) Take 1 tablet by  mouth daily.  . nitrofurantoin, macrocrystal-monohydrate, (MACROBID) 100 MG capsule Take 1 capsule (100 mg total) by mouth 2 (two) times daily.  . ondansetron (ZOFRAN ODT) 4 MG disintegrating tablet Take 1 tablet (4 mg total) by mouth every 8 (eight) hours as needed for nausea or vomiting.  . ondansetron (ZOFRAN) 4 MG tablet Take 1 tablet (4 mg total) by mouth every 8 (eight) hours as needed for nausea or vomiting.  . pregabalin (LYRICA) 75 MG capsule Take 1 capsule (75 mg total) by mouth at bedtime as needed (pain).  . Probiotic Product (PROBIOTIC PO) Take by mouth.  . Semaglutide, 1 MG/DOSE, (OZEMPIC, 1 MG/DOSE,) 2 MG/1.5ML SOPN Inject 1 mg into the skin once a week.  . simvastatin (ZOCOR) 40 MG tablet Take 1 tablet (40 mg total) by mouth at bedtime.  . vitamin B-12 (CYANOCOBALAMIN) 1000 MCG tablet Take 1,000 mcg by mouth daily.   . [DISCONTINUED] lansoprazole (PREVACID) 30 MG capsule Take 30 mg by mouth daily at 12 noon.  . [DISCONTINUED] pregabalin (LYRICA) 75 MG capsule Take 75 mg by mouth at bedtime as needed (pain).   . [DISCONTINUED] simvastatin (ZOCOR) 40 MG tablet Take 1 tablet (40 mg total) by mouth at bedtime.  Marland Kitchen HYDROcodone-acetaminophen (NORCO/VICODIN) 5-325 MG tablet Take 1 tablet by mouth every 6 (six) hours as needed for moderate pain. (Patient not taking: Reported on 11/04/2018)  . Insulin Glargine (TOUJEO MAX SOLOSTAR)  300 UNIT/ML SOPN Inject 24 Units into the skin 1 day or 1 dose for 1 dose.  . [DISCONTINUED] ibuprofen (ADVIL,MOTRIN) 800 MG tablet Take 1 tablet (800 mg total) by mouth 3 (three) times daily as needed.  . [DISCONTINUED] Semaglutide,0.25 or 0.5MG /DOS, (OZEMPIC, 0.25 OR 0.5 MG/DOSE,) 2 MG/1.5ML SOPN Inject 1 mg into the skin once a week.   No facility-administered encounter medications on file as of 11/04/2018.     Surgical History: Past Surgical History:  Procedure Laterality Date  . ABDOMINAL HYSTERECTOMY    . CHOLECYSTECTOMY    . KNEE ARTHROSCOPY Right   .  TENDON TRANSFER Right 01/20/2015   Procedure: RIGHT HAND AND MIDDLE FINGER TENDON RECONSTRUCTION AND TRANSFER;  Surgeon: Roseanne Kaufman, MD;  Location: [facility];  Service: Orthopedics;  Laterality: Right;    Medical History: Past Medical History:  Diagnosis Date  . Arthritis    osteoarthritis  . Cancer ([place]) 1995   endometrial ca  . COPD (chronic obstructive pulmonary disease) ([place])   . Diabetes mellitus without complication ([place])    type 2  . GERD (gastroesophageal reflux disease)   . History of kidney infection    had to have PICC line  . Neuropathy    both feet  . Osteopenia   . Osteoporosis   . Pneumonia     Family History: Family History  Problem Relation Age of Onset  . Pancreatic cancer Mother   . Cancer Mother        pancreaitis  . Lung cancer Father   . Breast cancer Sister 55      Review of Systems  Constitutional: Positive for fatigue. Negative for activity change, chills and unexpected weight change.  HENT: Negative for congestion, postnasal drip, rhinorrhea, sneezing and sore throat.   Respiratory: Negative for cough, chest tightness, shortness of breath and wheezing.   Cardiovascular: Negative for chest pain and palpitations.  Gastrointestinal: Negative for abdominal pain, constipation, diarrhea, nausea and vomiting.  Endocrine: Negative for cold intolerance, heat intolerance, polydipsia and polyuria.       Improved blood sugars  Genitourinary: Negative for dysuria, flank pain and urgency.  Musculoskeletal: Positive for arthralgias, myalgias, neck pain and neck stiffness. Negative for back pain and joint swelling.  Skin: Negative for rash.  Neurological: Positive for headaches. Negative for dizziness, tremors and numbness.  Hematological: Negative for adenopathy. Does not bruise/bleed easily.  Psychiatric/Behavioral: Negative for behavioral problems (Depression), sleep disturbance and suicidal ideas. The patient is not nervous/anxious.    Today's Vitals    11/04/18 1114  BP: (!) 156/78  Pulse: 84  Resp: 16  SpO2: 98%  Weight: 128 lb 3.2 oz (58.2 kg)  Height: 5\' 7"  (1.702 m)   Body mass index is 20.08 kg/m.  Physical Exam  Nursing note and vitals reviewed. Constitutional: She is oriented to person, place, and time. She appears well-developed and well-nourished.  HENT:  Head: Normocephalic and atraumatic.  Eyes: Pupils are equal, round, and reactive to light.  Neck: Normal range of motion. Neck supple. Carotid bruit is not present.  Cardiovascular: Normal rate, regular rhythm, normal heart sounds and intact distal pulses.  Respiratory: Effort normal and breath sounds normal. Right breast exhibits no inverted nipple, no mass, no nipple discharge, no skin change and no tenderness. Left breast exhibits no inverted nipple, no mass, no nipple discharge, no skin change and no tenderness.  GI: Soft. Bowel sounds are normal. There is no abdominal tenderness.  Musculoskeletal: Normal range of motion.  Neurological: She is  alert and oriented to person, place, and time. She has normal reflexes. No cranial nerve deficit.  Skin: Skin is warm and dry.  Psychiatric: She has a normal mood and affect.   Depression screen [facility] 2/9 11/04/2018 08/19/2018 08/06/2018 05/20/2018 01/04/2018  Decreased Interest 0 0 0 0 0  Down, Depressed, Hopeless 0 0 0 0 0  PHQ - 2 Score 0 0 0 0 0    Functional Status Survey: Is the patient deaf or have difficulty hearing?: No Does the patient have difficulty seeing, even when wearing glasses/contacts?: No Does the patient have difficulty concentrating, remembering, or making decisions?: No Does the patient have difficulty walking or climbing stairs?: No Does the patient have difficulty dressing or bathing?: No Does the patient have difficulty doing errands alone such as visiting a doctor's office or shopping?: No  MMSE - Mini Mental State Exam 11/04/2018 10/27/2017  Orientation to time 5 5  Orientation to Place 5 5   Registration 3 3  Attention/ Calculation 5 5  Recall 3 3  Language- name 2 objects 2 2  Language- repeat 1 1  Language- follow 3 step command 3 3  Language- read & follow direction 1 1  Write a sentence 1 1  Copy design 1 1  Total score 30 30    Fall Risk  11/04/2018 08/19/2018 08/06/2018 05/20/2018 01/04/2018  Falls in the past year? 0 0 0 0 No      LABS: Recent Results (from the past 2160 hour(s))  POCT HgB A1C     Status: Abnormal   Collection Time: 08/19/18  9:29 AM  Result Value Ref Range   Hemoglobin A1C 8.9 (A) 4.0 - 5.6 %   HbA1c POC (<> result, manual entry)     HbA1c, POC (prediabetic range)     HbA1c, POC (controlled diabetic range)    UA/M w/rflx Culture, Routine     Status: Abnormal (Preliminary result)   Collection Time: 11/04/18 11:20 AM   Specimen: Urine   URINE  Result Value Ref Range   Specific Gravity, UA 1.021 1.005 - 1.030   pH, UA 6.5 5.0 - 7.5   Color, UA Yellow Yellow   Appearance Ur Cloudy (A) Clear   Leukocytes,UA 2+ (A) Negative   Protein,UA Negative Negative/Trace   Glucose, UA Negative Negative   Ketones, UA Negative Negative   RBC, UA Negative Negative   Bilirubin, UA Negative Negative   Urobilinogen, Ur 0.2 0.2 - 1.0 mg/dL   Nitrite, UA Negative Negative   Microscopic Examination See below:     Comment: Microscopic was indicated and was performed.   Urinalysis Reflex Comment     Comment: This specimen has reflexed to a Urine Culture.  Microscopic Examination     Status: Abnormal   Collection Time: 11/04/18 11:20 AM   URINE  Result Value Ref Range   WBC, UA >30 (A) 0 - 5 /hpf    Comment: Clumps of leukocytes present.   RBC 0-2 0 - 2 /hpf   Epithelial Cells (non renal) 0-10 0 - 10 /hpf   Casts Present (A) None seen /lpf   Cast Type Hyaline casts N/A   Mucus, UA Present Not Estab.   Bacteria, UA Few None seen/Few  Urine Culture, Reflex     Status: Abnormal (Preliminary result)   Collection Time: 11/04/18 11:20 AM   URINE  Result  Value Ref Range   Urine Culture, Routine Preliminary report (A)    Organism ID, Bacteria Escherichia coli (A)  Comment: Greater than 100,000 colony forming units per mL  POCT HgB A1C     Status: Abnormal   Collection Time: 11/04/18 11:38 AM  Result Value Ref Range   Hemoglobin A1C 7.3 (A) 4.0 - 5.6 %   HbA1c POC (<> result, manual entry)     HbA1c, POC (prediabetic range)     HbA1c, POC (controlled diabetic range)     Assessment/Plan: 1. Encounter for general adult medical examination with abnormal findings Annual health maintenance exam today.   2. Type 2 diabetes mellitus with peripheral neuropathy (HCC) Improved. HgbA1c 7.3 today. Continue diabetic medication as prescribed. Continue to take Lyrica 75mg  twice daily as needed for neuropathy. Refer for diabetic eye exam.  - pregabalin (LYRICA) 75 MG capsule; Take 1 capsule (75 mg total) by mouth at bedtime as needed (pain).  Dispense: 30 capsule; Refill: 3 - Ambulatory referral to Ophthalmology  3. Essential hypertension Stable. Continue bp medication as prescribed   4. Gastroesophageal reflux disease without esophagitis - lansoprazole (PREVACID) 30 MG capsule; Take 1 capsule (30 mg total) by mouth daily at 12 noon.  Dispense: 90 capsule; Refill: 1  5. Mixed hyperlipidemia - simvastatin (ZOCOR) 40 MG tablet; Take 1 tablet (40 mg total) by mouth at bedtime.  Dispense: 90 tablet; Refill: 3  6. Screening for breast cancer - MM DIGITAL SCREENING BILATERAL; Future  7. Dysuria - UA/M w/rflx Culture, Routine   General Counseling: Sandy Salaam understanding of the findings of todays visit and agrees with plan of treatment. I have discussed any further diagnostic evaluation that may be needed or ordered today. We also reviewed her medications today. she has been encouraged to call the office with any questions or concerns that should arise related to todays visit.    Counseling:  Diabetes Counseling:  1. Addition of ACE inh/  ARB'S for nephroprotection. Microalbumin is updated  2. Diabetic foot care, prevention of complications. Podiatry consult 3. Exercise and lose weight.  4. Diabetic eye examination, Diabetic eye exam is updated  5. Monitor blood sugar closlely. nutrition counseling.  6. Sign and symptoms of hypoglycemia including shaking sweating,confusion and headaches.  This patient was seen by Leretha Pol FNP Collaboration with Dr Lavera Guise as a part of collaborative care agreement  Orders Placed This Encounter  Procedures  . Microscopic Examination  . Urine Culture, Reflex  . MM DIGITAL SCREENING BILATERAL  . UA/M w/rflx Culture, Routine  . Ambulatory referral to Ophthalmology  . POCT HgB A1C    Meds ordered this encounter  Medications  . simvastatin (ZOCOR) 40 MG tablet    Sig: Take 1 tablet (40 mg total) by mouth at bedtime.    Dispense:  90 tablet    Refill:  3    Order Specific Question:   Supervising Provider    Answer:   Lavera Guise [[id number]]  . pregabalin (LYRICA) 75 MG capsule    Sig: Take 1 capsule (75 mg total) by mouth at bedtime as needed (pain).    Dispense:  30 capsule    Refill:  3    Order Specific Question:   Supervising Provider    Answer:   Lavera Guise [[id number]]  . lansoprazole (PREVACID) 30 MG capsule    Sig: Take 1 capsule (30 mg total) by mouth daily at 12 noon.    Dispense:  90 capsule    Refill:  1    Order Specific Question:   Supervising Provider    Answer:   [place] Bigness  M [[id number]]    Time spent: [place], MD  Internal Medicine

## 2018-11-06 DIAGNOSIS — K219 Gastro-esophageal reflux disease without esophagitis: Secondary | ICD-10-CM | POA: Insufficient documentation

## 2018-11-07 LAB — MICROSCOPIC EXAMINATION: WBC, UA: >30 /hpf — AB (ref 0–5)

## 2018-11-07 LAB — UA/M W/RFLX CULTURE, ROUTINE
Bilirubin, UA: NEGATIVE
Glucose, UA: NEGATIVE
Ketones, UA: NEGATIVE
Nitrite, UA: NEGATIVE
Protein,UA: NEGATIVE
RBC, UA: NEGATIVE
Specific Gravity, UA: 1.021 (ref 1.005–1.030)
Urobilinogen, Ur: 0.2 mg/dL (ref 0.2–1.0)
pH, UA: 6.5 (ref 5.0–7.5)

## 2018-11-07 LAB — URINE CULTURE, REFLEX

## 2018-11-14 ENCOUNTER — Other Ambulatory Visit: Payer: Self-pay | Admitting: Nurse Practitioner

## 2018-11-14 DIAGNOSIS — N390 Urinary tract infection, site not specified: Secondary | ICD-10-CM

## 2018-11-14 MED ORDER — NITROFURANTOIN MONOHYD MACRO 100 MG PO CAPS: 100.0000 mg | ORAL_CAPSULE | Freq: Two times a day (BID) | ORAL | 0 refills | Status: DC

## 2018-11-14 NOTE — Progress Notes (Signed)
uti present on u/a taken during her physical. I have sent macrobid 100mg  which should be taken twice daily for 7 days. Sent to [facility] in [place].

## 2018-11-15 ENCOUNTER — Telehealth: Payer: Self-pay

## 2018-11-15 NOTE — Telephone Encounter (Signed)
Informed pt of urine results and new rx sent to pharmacy

## 2018-11-18 ENCOUNTER — Ambulatory Visit: Payer: Medicare HMO | Admitting: Nurse Practitioner

## 2018-11-22 ENCOUNTER — Other Ambulatory Visit: Payer: Self-pay | Admitting: Nurse Practitioner

## 2018-11-22 DIAGNOSIS — I10 Essential (primary) hypertension: Secondary | ICD-10-CM

## 2018-11-22 MED ORDER — LISINOPRIL 10 MG PO TABS: 10.0000 mg | ORAL_TABLET | Freq: Every day | ORAL | 4 refills | Status: DC

## 2018-12-02 DIAGNOSIS — Z03818 Encounter for observation for suspected exposure to other biological agents ruled out: Secondary | ICD-10-CM | POA: Diagnosis not present

## 2018-12-07 ENCOUNTER — Other Ambulatory Visit: Payer: Self-pay

## 2018-12-07 ENCOUNTER — Other Ambulatory Visit
Admission: RE | Admit: 2018-12-07 | Discharge: 2018-12-07 | Disposition: A | Discharge status: Home / Self Care | Payer: Medicare HMO | Attending: Nurse Practitioner | Admitting: Nurse Practitioner

## 2018-12-07 DIAGNOSIS — B342 Coronavirus infection, unspecified: Secondary | ICD-10-CM | POA: Diagnosis not present

## 2018-12-07 DIAGNOSIS — E559 Vitamin D deficiency, unspecified: Secondary | ICD-10-CM | POA: Insufficient documentation

## 2018-12-07 DIAGNOSIS — E119 Type 2 diabetes mellitus without complications: Secondary | ICD-10-CM | POA: Diagnosis not present

## 2018-12-07 DIAGNOSIS — D649 Anemia, unspecified: Secondary | ICD-10-CM | POA: Diagnosis not present

## 2018-12-07 DIAGNOSIS — Z0000 Encounter for general adult medical examination without abnormal findings: Secondary | ICD-10-CM | POA: Insufficient documentation

## 2018-12-07 LAB — COMPREHENSIVE METABOLIC PANEL
ALT: 16 U/L (ref 0–44)
AST: 19 U/L (ref 15–41)
Albumin: 4.5 g/dL (ref 3.5–5.0)
Alkaline Phosphatase: 66 U/L (ref 38–126)
Anion gap: 9 (ref 5–15)
BUN: 28 mg/dL — ABNORMAL HIGH (ref 8–23)
CO2: 25 mmol/L (ref 22–32)
Calcium: 10 mg/dL (ref 8.9–10.3)
Chloride: 105 mmol/L (ref 98–111)
Creatinine, Ser: 0.62 mg/dL (ref 0.44–1.00)
GFR calc Af Amer: >60 mL/min (ref >60)
GFR calc non Af Amer: >60 mL/min (ref >60)
Glucose, Bld: 116 mg/dL — ABNORMAL HIGH (ref 70–99)
Potassium: 4 mmol/L (ref 3.5–5.1)
Sodium: 139 mmol/L (ref 135–145)
Total Bilirubin: 0.3 mg/dL (ref 0.3–1.2)
Total Protein: 7.9 g/dL (ref 6.5–8.1)

## 2018-12-07 LAB — CBC
HCT: 38.4 % (ref 36.0–46.0)
Hemoglobin: 12.5 g/dL (ref 12.0–15.0)
MCH: 30.3 pg (ref 26.0–34.0)
MCHC: 32.6 g/dL (ref 30.0–36.0)
MCV: 93 fL (ref 80.0–100.0)
Platelets: 245 10*3/uL (ref 150–400)
RBC: 4.13 MIL/uL (ref 3.87–5.11)
RDW: 13 % (ref 11.5–15.5)
WBC: 9.1 10*3/uL (ref 4.0–10.5)
nRBC: 0 % (ref 0.0–0.2)

## 2018-12-07 LAB — LIPID PANEL
Cholesterol: 151 mg/dL (ref 0–200)
HDL: 47 mg/dL (ref >40)
LDL Cholesterol: 79 mg/dL (ref 0–99)
Total CHOL/HDL Ratio: 3.2 RATIO
Triglycerides: 127 mg/dL (ref <150)
VLDL: 25 mg/dL (ref 0–40)

## 2018-12-07 LAB — FOLATE: Folate: 20.9 ng/mL (ref >5.9)

## 2018-12-07 LAB — T4, FREE: Free T4: 0.88 ng/dL (ref 0.61–1.12)

## 2018-12-07 LAB — FERRITIN: Ferritin: 49 ng/mL (ref 11–307)

## 2018-12-07 LAB — TSH: TSH: 1.276 u[IU]/mL (ref 0.350–4.500)

## 2018-12-07 LAB — VITAMIN B12: Vitamin B-12: 2930 pg/mL — ABNORMAL HIGH (ref 180–914)

## 2018-12-08 LAB — VITAMIN D 25 HYDROXY (VIT D DEFICIENCY, FRACTURES): Vit D, 25-Hydroxy: 32.8 ng/mL (ref 30.0–100.0)

## 2018-12-31 ENCOUNTER — Other Ambulatory Visit: Payer: Self-pay

## 2018-12-31 DIAGNOSIS — Z20822 Contact with and (suspected) exposure to covid-19: Secondary | ICD-10-CM

## 2019-01-02 LAB — NOVEL CORONAVIRUS, NAA: SARS-CoV-2, NAA: NOT DETECTED

## 2019-01-03 ENCOUNTER — Telehealth: Payer: Self-pay | Admitting: Internal Medicine

## 2019-01-03 NOTE — Telephone Encounter (Signed)
Error

## 2019-01-04 ENCOUNTER — Ambulatory Visit
Admission: RE | Admit: 2019-01-04 | Discharge: 2019-01-04 | Disposition: A | Discharge status: Home / Self Care | Payer: Medicare HMO | Source: Ambulatory Visit | Attending: Nurse Practitioner | Admitting: Nurse Practitioner

## 2019-01-04 DIAGNOSIS — Z1231 Encounter for screening mammogram for malignant neoplasm of breast: Secondary | ICD-10-CM | POA: Insufficient documentation

## 2019-01-04 DIAGNOSIS — Z1239 Encounter for other screening for malignant neoplasm of breast: Secondary | ICD-10-CM

## 2019-01-04 NOTE — Progress Notes (Signed)
Negative mammogram

## 2019-01-06 DIAGNOSIS — H2513 Age-related nuclear cataract, bilateral: Secondary | ICD-10-CM | POA: Diagnosis not present

## 2019-01-06 DIAGNOSIS — E119 Type 2 diabetes mellitus without complications: Secondary | ICD-10-CM | POA: Diagnosis not present

## 2019-01-18 DIAGNOSIS — E119 Type 2 diabetes mellitus without complications: Secondary | ICD-10-CM | POA: Diagnosis not present

## 2019-02-09 ENCOUNTER — Encounter: Payer: Self-pay | Admitting: Nurse Practitioner

## 2019-02-09 ENCOUNTER — Ambulatory Visit (INDEPENDENT_AMBULATORY_CARE_PROVIDER_SITE_OTHER): Payer: Medicare HMO | Admitting: Nurse Practitioner

## 2019-02-09 ENCOUNTER — Other Ambulatory Visit: Payer: Self-pay

## 2019-02-09 VITALS — BP 149/83 | HR 93 | Temp 97.5°F | Resp 16 | Ht 67.0 in | Wt 131.6 lb

## 2019-02-09 DIAGNOSIS — I10 Essential (primary) hypertension: Secondary | ICD-10-CM | POA: Diagnosis not present

## 2019-02-09 DIAGNOSIS — F17200 Nicotine dependence, unspecified, uncomplicated: Secondary | ICD-10-CM | POA: Diagnosis not present

## 2019-02-09 DIAGNOSIS — E1165 Type 2 diabetes mellitus with hyperglycemia: Secondary | ICD-10-CM | POA: Diagnosis not present

## 2019-02-09 DIAGNOSIS — J449 Chronic obstructive pulmonary disease, unspecified: Secondary | ICD-10-CM

## 2019-02-09 LAB — POCT GLYCOSYLATED HEMOGLOBIN (HGB A1C): Hemoglobin A1C: 6.6 % — AB (ref 4.0–5.6)

## 2019-02-09 NOTE — Progress Notes (Signed)
[facility] [place], [place] [address]  Internal MEDICINE  Office Visit Note  Patient Name: Alexandra Yang  [id number]  [id number]  Date of Service: 02/09/2019  Chief Complaint  Patient presents with  . Diabetes    needs rx for shoes    The patient is here for follow of type 2 diabetes. She states that her blood sugars have been doing very well. In fact, they have been doing better than ever before. Her HgbA1c is 6.6. Her only complaint is that she is frequently cold. She did have labs done. Her blood count and thyroid panels are all within normal limits. She does not have problems or concerns to discuss today. She had her screening mammogram 01/04/2019 and it was negative .      Current Medication: Outpatient Encounter Medications as of 02/09/2019  Medication Sig  . albuterol (PROVENTIL) (2.5 MG/3ML) 0.083% nebulizer solution Take 2.5 mg by nebulization every 6 (six) hours as needed for wheezing or shortness of breath.  Marland Kitchen aspirin 81 MG chewable tablet Chew 81 mg by mouth every evening.   . cholecalciferol (VITAMIN D) 1000 UNITS tablet Take 1,000 Units by mouth daily.  Marland Kitchen CRANBERRY PO Take by mouth.  Marland Kitchen glucose blood (ACCU-CHEK GUIDE) test strip Blood sugars tested QD and prn.  Dx - E11.65  . HYDROcodone-acetaminophen (NORCO/VICODIN) 5-325 MG tablet Take 1 tablet by mouth every 6 (six) hours as needed for moderate pain.  Marland Kitchen ibuprofen (ADVIL,MOTRIN) 800 MG tablet Take 1 tablet (800 mg total) by mouth 3 (three) times daily as needed.  . Insulin Pen Needle 32G X 4 MM MISC USE AS ONCE DAILY  . ipratropium-albuterol (DUONEB) 0.5-2.5 (3) MG/3ML SOLN Use as directed three time a day diag j44.9  . lansoprazole (PREVACID) 30 MG capsule Take 1 capsule (30 mg total) by mouth daily at 12 noon.  Marland Kitchen lisinopril (ZESTRIL) 10 MG tablet Take 1 tablet (10 mg total) by mouth daily.  . metFORMIN (GLUCOPHAGE-XR) 500 MG 24 hr tablet TAKE 1 TABLET EVERY NIGHT  . Multiple  Vitamins-Minerals (CENTRUM SILVER PO) Take 1 tablet by mouth daily.  . nitrofurantoin, macrocrystal-monohydrate, (MACROBID) 100 MG capsule Take 1 capsule (100 mg total) by mouth 2 (two) times daily.  . ondansetron (ZOFRAN ODT) 4 MG disintegrating tablet Take 1 tablet (4 mg total) by mouth every 8 (eight) hours as needed for nausea or vomiting.  . ondansetron (ZOFRAN) 4 MG tablet Take 1 tablet (4 mg total) by mouth every 8 (eight) hours as needed for nausea or vomiting.  . pregabalin (LYRICA) 75 MG capsule Take 1 capsule (75 mg total) by mouth at bedtime as needed (pain).  . Probiotic Product (PROBIOTIC PO) Take by mouth.  . Semaglutide, 1 MG/DOSE, (OZEMPIC, 1 MG/DOSE,) 2 MG/1.5ML SOPN Inject 1 mg into the skin once a week.  . simvastatin (ZOCOR) 40 MG tablet Take 1 tablet (40 mg total) by mouth at bedtime.  . vitamin B-12 (CYANOCOBALAMIN) 1000 MCG tablet Take 1,000 mcg by mouth daily.   . Insulin Glargine (TOUJEO MAX SOLOSTAR) 300 UNIT/ML SOPN Inject 24 Units into the skin 1 day or 1 dose for 1 dose.   No facility-administered encounter medications on file as of 02/09/2019.     Surgical History: Past Surgical History:  Procedure Laterality Date  . ABDOMINAL HYSTERECTOMY    . CHOLECYSTECTOMY    . KNEE ARTHROSCOPY Right   . TENDON TRANSFER Right 01/20/2015   Procedure: RIGHT HAND AND MIDDLE FINGER TENDON RECONSTRUCTION AND  TRANSFER;  Surgeon: Roseanne Kaufman, MD;  Location: [facility];  Service: Orthopedics;  Laterality: Right;    Medical History: Past Medical History:  Diagnosis Date  . Arthritis    osteoarthritis  . Cancer ([place]) 1995   endometrial ca  . COPD (chronic obstructive pulmonary disease) ([place])   . Diabetes mellitus without complication ([place])    type 2  . GERD (gastroesophageal reflux disease)   . History of kidney infection    had to have PICC line  . Neuropathy    both feet  . Osteopenia   . Osteoporosis   . Pneumonia     Family History: Family History  Problem  Relation Age of Onset  . Pancreatic cancer Mother   . Cancer Mother        pancreaitis  . Lung cancer Father   . Breast cancer Sister 63    Social History   Socioeconomic History  . Marital status: Widowed    Spouse name: Not on file  . Number of children: Not on file  . Years of education: Not on file  . Highest education level: Not on file  Occupational History  . Not on file  Social Needs  . Financial resource strain: Not on file  . Food insecurity    Worry: Not on file    Inability: Not on file  . Transportation needs    Medical: Not on file    Non-medical: Not on file  Tobacco Use  . Smoking status: Current Every Day Smoker    Packs/day: 0.50    Years: 40.00    Pack years: 20.00    Types: Cigarettes  . Smokeless tobacco: Never Used  Substance and Sexual Activity  . Alcohol use: Yes    Comment: occasional  . Drug use: No  . Sexual activity: Not on file  Lifestyle  . Physical activity    Days per week: Not on file    Minutes per session: Not on file  . Stress: Not on file  Relationships  . Social Herbalist on phone: Not on file    Gets together: Not on file    Attends religious service: Not on file    Active member of club or organization: Not on file    Attends meetings of clubs or organizations: Not on file    Relationship status: Not on file  . Intimate partner violence    Fear of current or ex partner: Not on file    Emotionally abused: Not on file    Physically abused: Not on file    Forced sexual activity: Not on file  Other Topics Concern  . Not on file  Social History Narrative  . Not on file      Review of Systems  Constitutional: Negative for activity change, chills, fatigue and unexpected weight change.  HENT: Negative for congestion, postnasal drip, rhinorrhea, sneezing and sore throat.   Respiratory: Negative for cough, chest tightness, shortness of breath and wheezing.   Cardiovascular: Negative for chest pain and  palpitations.  Gastrointestinal: Negative for abdominal pain, constipation, diarrhea, nausea and vomiting.  Endocrine: Negative for cold intolerance, heat intolerance, polydipsia and polyuria.       Blood sugars doing well   Musculoskeletal: Positive for arthralgias, myalgias, neck pain and neck stiffness. Negative for back pain and joint swelling.  Skin: Negative for rash.  Neurological: Positive for headaches. Negative for dizziness, tremors and numbness.  Hematological: Negative for adenopathy. Does not bruise/bleed easily.  Psychiatric/Behavioral: Negative for behavioral problems (Depression), sleep disturbance and suicidal ideas. The patient is not nervous/anxious.     Today's Vitals   02/09/19 1135  BP: (!) 149/83  Pulse: 93  Resp: 16  Temp: (!) 97.5 F (36.4 C)  SpO2: 96%  Weight: 131 lb 9.6 oz (59.7 kg)  Height: 5\' 7"  (1.702 m)   Body mass index is 20.61 kg/m.   Physical Exam Vitals signs and nursing note reviewed.  Constitutional:      General: She is not in acute distress.    Appearance: Normal appearance. She is well-developed. She is not diaphoretic.  HENT:     Head: Normocephalic and atraumatic.     Mouth/Throat:     Pharynx: No oropharyngeal exudate.  Eyes:     Pupils: Pupils are equal, round, and reactive to light.  Neck:     Musculoskeletal: Normal range of motion and neck supple.     Thyroid: No thyromegaly.     Vascular: No carotid bruit or JVD.     Trachea: No tracheal deviation.     Comments: Limited ROM when bending and twisting her head left and right, but mostly to the left.  Cardiovascular:     Rate and Rhythm: Normal rate and regular rhythm.     Heart sounds: Normal heart sounds. No murmur. No friction rub. No gallop.   Pulmonary:     Effort: Pulmonary effort is normal. No respiratory distress.     Breath sounds: Normal breath sounds. No wheezing or rales.  Chest:     Chest wall: No tenderness.  Abdominal:     Palpations: Abdomen is soft.   Musculoskeletal: Normal range of motion.  Lymphadenopathy:     Cervical: No cervical adenopathy.  Skin:    General: Skin is warm and dry.  Neurological:     Mental Status: She is alert and oriented to person, place, and time. Mental status is at baseline.     Cranial Nerves: No cranial nerve deficit.  Psychiatric:        Behavior: Behavior normal.        Thought Content: Thought content normal.        Judgment: Judgment normal.   Assessment/Plan: 1. Type 2 diabetes mellitus with hyperglycemia, unspecified whether long term insulin use (HCC) - POCT HgB A1C 6.6 today. cotinue all diabetic medication as prescribed   2. Essential hypertension Generally stable. Continue bp medication as prescribed   3. Nicotine dependence with current use Discussed importance of smoking cessation. Patient not ready to quit at this time.   4. COPD (chronic obstructive pulmonary disease) with chronic bronchitis (HCC) Stable. Continue to use inhalers and respiratory treatments as prescribed .  General Counseling: elyzah heldenbrand understanding of the findings of todays visit and agrees with plan of treatment. I have discussed any further diagnostic evaluation that may be needed or ordered today. We also reviewed her medications today. she has been encouraged to call the office with any questions or concerns that should arise related to todays visit.    Orders Placed This Encounter  Procedures  . POCT HgB A1C   Diabetes Counseling:  1. Addition of ACE inh/ ARB'S for nephroprotection. Microalbumin is updated  2. Diabetic foot care, prevention of complications. Podiatry consult 3. Exercise and lose weight.  4. Diabetic eye examination, Diabetic eye exam is updated  5. Monitor blood sugar closlely. nutrition counseling.  6. Sign and symptoms of hypoglycemia including shaking sweating,confusion and headaches.  This patient was seen by  Leretha Pol FNP Collaboration with Dr Lavera Guise as a part of  collaborative care agreement   Time spent: 25 Minutes      Dr Lavera Guise Internal medicine

## 2019-02-10 ENCOUNTER — Ambulatory Visit: Payer: Medicare HMO | Admitting: Adult Health

## 2019-03-03 DIAGNOSIS — H53032 Strabismic amblyopia, left eye: Secondary | ICD-10-CM | POA: Diagnosis not present

## 2019-03-03 DIAGNOSIS — E119 Type 2 diabetes mellitus without complications: Secondary | ICD-10-CM | POA: Diagnosis not present

## 2019-03-03 DIAGNOSIS — H2513 Age-related nuclear cataract, bilateral: Secondary | ICD-10-CM | POA: Diagnosis not present

## 2019-03-03 DIAGNOSIS — H25013 Cortical age-related cataract, bilateral: Secondary | ICD-10-CM | POA: Diagnosis not present

## 2019-03-03 DIAGNOSIS — H2512 Age-related nuclear cataract, left eye: Secondary | ICD-10-CM | POA: Diagnosis not present

## 2019-03-07 ENCOUNTER — Other Ambulatory Visit: Payer: Self-pay | Admitting: Nurse Practitioner

## 2019-03-07 ENCOUNTER — Encounter: Payer: Self-pay | Admitting: Nurse Practitioner

## 2019-03-07 MED ORDER — METFORMIN HCL ER 500 MG PO TB24: ORAL_TABLET | ORAL | 1 refills | Status: DC

## 2019-03-07 NOTE — Telephone Encounter (Signed)
Pt called for refill on Metformin and just wanted to let Nira Conn Know that she will be having Cataract surgery on 03/15/19 and on 04/12/19 . Refill sent to the [facility]

## 2019-03-15 DIAGNOSIS — H25812 Combined forms of age-related cataract, left eye: Secondary | ICD-10-CM | POA: Diagnosis not present

## 2019-03-15 DIAGNOSIS — H2512 Age-related nuclear cataract, left eye: Secondary | ICD-10-CM | POA: Diagnosis not present

## 2019-03-22 DIAGNOSIS — H2512 Age-related nuclear cataract, left eye: Secondary | ICD-10-CM | POA: Diagnosis not present

## 2019-03-31 ENCOUNTER — Other Ambulatory Visit: Payer: Self-pay | Admitting: Internal Medicine

## 2019-03-31 MED ORDER — METFORMIN HCL ER 500 MG PO TB24: ORAL_TABLET | ORAL | 1 refills | Status: DC

## 2019-04-04 DIAGNOSIS — H2511 Age-related nuclear cataract, right eye: Secondary | ICD-10-CM | POA: Diagnosis not present

## 2019-04-04 DIAGNOSIS — H25011 Cortical age-related cataract, right eye: Secondary | ICD-10-CM | POA: Diagnosis not present

## 2019-04-12 DIAGNOSIS — H2511 Age-related nuclear cataract, right eye: Secondary | ICD-10-CM | POA: Diagnosis not present

## 2019-04-12 DIAGNOSIS — H25811 Combined forms of age-related cataract, right eye: Secondary | ICD-10-CM | POA: Diagnosis not present

## 2019-04-12 DIAGNOSIS — H25011 Cortical age-related cataract, right eye: Secondary | ICD-10-CM | POA: Diagnosis not present

## 2019-04-19 DIAGNOSIS — H2511 Age-related nuclear cataract, right eye: Secondary | ICD-10-CM | POA: Diagnosis not present

## 2019-05-03 ENCOUNTER — Other Ambulatory Visit: Payer: Self-pay

## 2019-05-03 ENCOUNTER — Other Ambulatory Visit: Payer: Self-pay | Admitting: Nurse Practitioner

## 2019-05-03 ENCOUNTER — Telehealth: Payer: Self-pay

## 2019-05-03 DIAGNOSIS — M064 Inflammatory polyarthropathy: Secondary | ICD-10-CM

## 2019-05-03 DIAGNOSIS — K219 Gastro-esophageal reflux disease without esophagitis: Secondary | ICD-10-CM

## 2019-05-03 MED ORDER — IBUPROFEN 800 MG PO TABS: 800.0000 mg | ORAL_TABLET | Freq: Three times a day (TID) | ORAL | 2 refills | Status: DC | PRN

## 2019-05-03 MED ORDER — LANSOPRAZOLE 30 MG PO CPDR: 30.0000 mg | DELAYED_RELEASE_CAPSULE | Freq: Every day | ORAL | 1 refills | Status: DC

## 2019-05-03 NOTE — Telephone Encounter (Signed)
Renewed ibuprofen 800mg  to take as needed and as prescribed and sent to her pharmacy.

## 2019-05-03 NOTE — Progress Notes (Signed)
Renewed ibuprofen 800mg  to take as needed and as prescribed and sent to her pharmacy.

## 2019-05-04 ENCOUNTER — Telehealth: Payer: Self-pay

## 2019-05-04 NOTE — Telephone Encounter (Signed)
Yes. Definitely. Her job and having diabetes puts her at increased risk of complications should should she contract COVID 19.

## 2019-05-04 NOTE — Telephone Encounter (Signed)
Lmov for patient ,advised patient on having the vaccine

## 2019-05-06 ENCOUNTER — Telehealth: Payer: Self-pay

## 2019-05-06 NOTE — Telephone Encounter (Signed)
CONFIRMED AND SCREENED FOR 05-10-19 OV. 

## 2019-05-10 ENCOUNTER — Ambulatory Visit (INDEPENDENT_AMBULATORY_CARE_PROVIDER_SITE_OTHER): Payer: Medicare HMO | Admitting: Adult Health

## 2019-05-10 ENCOUNTER — Encounter: Payer: Self-pay | Admitting: Nurse Practitioner

## 2019-05-10 ENCOUNTER — Other Ambulatory Visit: Payer: Self-pay

## 2019-05-10 VITALS — BP 116/60 | HR 68 | Resp 16 | Ht 67.0 in | Wt 131.8 lb

## 2019-05-10 DIAGNOSIS — F17200 Nicotine dependence, unspecified, uncomplicated: Secondary | ICD-10-CM | POA: Diagnosis not present

## 2019-05-10 DIAGNOSIS — I10 Essential (primary) hypertension: Secondary | ICD-10-CM | POA: Diagnosis not present

## 2019-05-10 DIAGNOSIS — E1165 Type 2 diabetes mellitus with hyperglycemia: Secondary | ICD-10-CM

## 2019-05-10 DIAGNOSIS — J449 Chronic obstructive pulmonary disease, unspecified: Secondary | ICD-10-CM | POA: Diagnosis not present

## 2019-05-10 LAB — POCT GLYCOSYLATED HEMOGLOBIN (HGB A1C): Hemoglobin A1C: 7.5 % — AB (ref 4.0–5.6)

## 2019-05-10 NOTE — Progress Notes (Signed)
[facility] [place],  [address]  Internal MEDICINE  Office Visit Note  Patient Name: Alexandra Yang  [id number]  [id number]  Date of Service: 05/10/2019  Chief Complaint  Patient presents with  . Medical Management of Chronic Issues  . Diabetes    HPI  Pt is here for follow up on DM. Her A1C is 7.5 today.  She repots she has been drinking a lot of [employer] iced coffees.  She knows she needs to cut back.  She declines to change her medication at this time. She denies any other issues at this time.  Her breathing has been good, and denies overt issues with copd.    Current Medication: Outpatient Encounter Medications as of 05/10/2019  Medication Sig  . albuterol (PROVENTIL) (2.5 MG/3ML) 0.083% nebulizer solution Take 2.5 mg by nebulization every 6 (six) hours as needed for wheezing or shortness of breath.  Marland Kitchen aspirin 81 MG chewable tablet Chew 81 mg by mouth every evening.   . cholecalciferol (VITAMIN D) 1000 UNITS tablet Take 1,000 Units by mouth daily.  Marland Kitchen CRANBERRY PO Take by mouth.  Marland Kitchen glucose blood (ACCU-CHEK GUIDE) test strip Blood sugars tested QD and prn.  Dx - E11.65  . HYDROcodone-acetaminophen (NORCO/VICODIN) 5-325 MG tablet Take 1 tablet by mouth every 6 (six) hours as needed for moderate pain.  Marland Kitchen ibuprofen (ADVIL) 800 MG tablet Take 1 tablet (800 mg total) by mouth 3 (three) times daily as needed.  . Insulin Pen Needle 32G X 4 MM MISC USE AS ONCE DAILY  . ipratropium-albuterol (DUONEB) 0.5-2.5 (3) MG/3ML SOLN Use as directed three time a day diag j44.9  . lansoprazole (PREVACID) 30 MG capsule Take 1 capsule (30 mg total) by mouth daily at 12 noon.  Marland Kitchen lisinopril (ZESTRIL) 10 MG tablet Take 1 tablet (10 mg total) by mouth daily.  . metFORMIN (GLUCOPHAGE-XR) 500 MG 24 hr tablet TAKE 1 TABLET EVERY NIGHT  . Multiple Vitamins-Minerals (CENTRUM SILVER PO) Take 1 tablet by mouth daily.  . nitrofurantoin, macrocrystal-monohydrate,  (MACROBID) 100 MG capsule Take 1 capsule (100 mg total) by mouth 2 (two) times daily.  . ondansetron (ZOFRAN ODT) 4 MG disintegrating tablet Take 1 tablet (4 mg total) by mouth every 8 (eight) hours as needed for nausea or vomiting.  . ondansetron (ZOFRAN) 4 MG tablet Take 1 tablet (4 mg total) by mouth every 8 (eight) hours as needed for nausea or vomiting.  . pregabalin (LYRICA) 75 MG capsule Take 1 capsule (75 mg total) by mouth at bedtime as needed (pain).  . Probiotic Product (PROBIOTIC PO) Take by mouth.  . Semaglutide, 1 MG/DOSE, (OZEMPIC, 1 MG/DOSE,) 2 MG/1.5ML SOPN Inject 1 mg into the skin once a week.  . simvastatin (ZOCOR) 40 MG tablet Take 1 tablet (40 mg total) by mouth at bedtime.  . vitamin B-12 (CYANOCOBALAMIN) 1000 MCG tablet Take 1,000 mcg by mouth daily.   . Insulin Glargine (TOUJEO MAX SOLOSTAR) 300 UNIT/ML SOPN Inject 24 Units into the skin 1 day or 1 dose for 1 dose.   No facility-administered encounter medications on file as of 05/10/2019.    Surgical History: Past Surgical History:  Procedure Laterality Date  . ABDOMINAL HYSTERECTOMY    . CHOLECYSTECTOMY    . EYE SURGERY    . KNEE ARTHROSCOPY Right   . TENDON TRANSFER Right 01/20/2015   Procedure: RIGHT HAND AND MIDDLE FINGER TENDON RECONSTRUCTION AND TRANSFER;  Surgeon: Roseanne Kaufman, MD;  Location: [facility];  Service: Orthopedics;  Laterality: Right;    Medical History: Past Medical History:  Diagnosis Date  . Arthritis    osteoarthritis  . Cancer ([place]) 1995   endometrial ca  . Cataract   . COPD (chronic obstructive pulmonary disease) ([place])   . Diabetes mellitus without complication ([place])    type 2  . GERD (gastroesophageal reflux disease)   . History of kidney infection    had to have PICC line  . Neuropathy    both feet  . Osteopenia   . Osteoporosis   . Pneumonia     Family History: Family History  Problem Relation Age of Onset  . Pancreatic cancer Mother   . Cancer Mother        pancreaitis   . Lung cancer Father   . Breast cancer Sister 67    Social History   Socioeconomic History  . Marital status: Widowed    Spouse name: Not on file  . Number of children: Not on file  . Years of education: Not on file  . Highest education level: Not on file  Occupational History  . Not on file  Tobacco Use  . Smoking status: Current Every Day Smoker    Packs/day: 0.50    Years: 40.00    Pack years: 20.00    Types: Cigarettes  . Smokeless tobacco: Never Used  Substance and Sexual Activity  . Alcohol use: Yes    Comment: occasional  . Drug use: No  . Sexual activity: Not on file  Other Topics Concern  . Not on file  Social History Narrative  . Not on file   Social Determinants of Health   Financial Resource Strain:   . Difficulty of Paying Living Expenses: Not on file  Food Insecurity:   . Worried About Charity fundraiser in the Last Year: Not on file  . Ran Out of Food in the Last Year: Not on file  Transportation Needs:   . Lack of Transportation (Medical): Not on file  . Lack of Transportation (Non-Medical): Not on file  Physical Activity:   . Days of Exercise per Week: Not on file  . Minutes of Exercise per Session: Not on file  Stress:   . Feeling of Stress : Not on file  Social Connections:   . Frequency of Communication with Friends and Family: Not on file  . Frequency of Social Gatherings with Friends and Family: Not on file  . Attends Religious Services: Not on file  . Active Member of Clubs or Organizations: Not on file  . Attends Archivist Meetings: Not on file  . Marital Status: Not on file  Intimate Partner Violence:   . Fear of Current or Ex-Partner: Not on file  . Emotionally Abused: Not on file  . Physically Abused: Not on file  . Sexually Abused: Not on file      Review of Systems  Constitutional: Negative for chills, fatigue and unexpected weight change.  HENT: Negative for congestion, rhinorrhea, sneezing and sore throat.    Eyes: Negative for photophobia, pain and redness.  Respiratory: Negative for cough, chest tightness and shortness of breath.   Cardiovascular: Negative for chest pain and palpitations.  Gastrointestinal: Negative for abdominal pain, constipation, diarrhea, nausea and vomiting.  Endocrine: Negative.   Genitourinary: Negative for dysuria and frequency.  Musculoskeletal: Negative for arthralgias, back pain, joint swelling and neck pain.  Skin: Negative for rash.  Allergic/Immunologic: Negative.   Neurological: Negative for tremors and numbness.  Hematological: Negative for adenopathy. Does not bruise/bleed easily.  Psychiatric/Behavioral: Negative for behavioral problems and sleep disturbance. The patient is not nervous/anxious.     Vital Signs: BP 116/60   Pulse 68   Resp 16   Ht 5\' 7"  (1.702 m)   Wt 131 lb 12.8 oz (59.8 kg)   SpO2 96%   BMI 20.64 kg/m    Physical Exam Vitals and nursing note reviewed.  Constitutional:      General: She is not in acute distress.    Appearance: She is well-developed. She is not diaphoretic.  HENT:     Head: Normocephalic and atraumatic.     Mouth/Throat:     Pharynx: No oropharyngeal exudate.  Eyes:     Pupils: Pupils are equal, round, and reactive to light.  Neck:     Thyroid: No thyromegaly.     Vascular: No JVD.     Trachea: No tracheal deviation.  Cardiovascular:     Rate and Rhythm: Normal rate and regular rhythm.     Heart sounds: Normal heart sounds. No murmur. No friction rub. No gallop.   Pulmonary:     Effort: Pulmonary effort is normal. No respiratory distress.     Breath sounds: Normal breath sounds. No wheezing or rales.  Chest:     Chest wall: No tenderness.  Abdominal:     Palpations: Abdomen is soft.     Tenderness: There is no abdominal tenderness. There is no guarding.  Musculoskeletal:        General: Normal range of motion.     Cervical back: Normal range of motion and neck supple.  Lymphadenopathy:      Cervical: No cervical adenopathy.  Skin:    General: Skin is warm and dry.  Neurological:     Mental Status: She is alert and oriented to person, place, and time.     Cranial Nerves: No cranial nerve deficit.  Psychiatric:        Behavior: Behavior normal.        Thought Content: Thought content normal.        Judgment: Judgment normal.    Assessment/Plan: 1. Uncontrolled type 2 diabetes mellitus with hyperglycemia (HCC) Increased today.  Continue medications as prescribed.  - POCT HgB A1C  2. Essential hypertension Stable, continue present management.   3. COPD (chronic obstructive pulmonary disease) with chronic bronchitis (HCC) Stable, continue present management.   4. Nicotine dependence with current use Smoking cessation counseling: 1. Pt acknowledges the risks of long term smoking, she will try to quite smoking. 2. Options for different medications including nicotine products, chewing gum, patch etc, Wellbutrin and Chantix is discussed 3. Goal and date of compete cessation is discussed 4. Total time spent in smoking cessation is 15 min.   General Counseling: genieva rochel understanding of the findings of todays visit and agrees with plan of treatment. I have discussed any further diagnostic evaluation that may be needed or ordered today. We also reviewed her medications today. she has been encouraged to call the office with any questions or concerns that should arise related to todays visit.    Orders Placed This Encounter  Procedures  . POCT HgB A1C    No orders of the defined types were placed in this encounter.   Time spent: 25 Minutes   This patient was seen by Orson Gear AGNP-C in Collaboration with Dr Lavera Guise as a part of collaborative care agreement     Kendell Bane AGNP-C Internal  medicine

## 2019-05-16 ENCOUNTER — Other Ambulatory Visit: Payer: Self-pay | Admitting: Adult Health

## 2019-08-05 ENCOUNTER — Other Ambulatory Visit: Payer: Self-pay

## 2019-08-05 ENCOUNTER — Telehealth: Payer: Self-pay

## 2019-08-05 MED ORDER — OZEMPIC (1 MG/DOSE) 2 MG/1.5ML ~~LOC~~ SOPN: 1.0000 mg | PEN_INJECTOR | SUBCUTANEOUS | 3 refills | Status: DC

## 2019-08-05 NOTE — Telephone Encounter (Signed)
CONFIRMED AND SCREENED FOR 08-09-19 OV.

## 2019-08-09 ENCOUNTER — Other Ambulatory Visit: Payer: Self-pay

## 2019-08-09 ENCOUNTER — Encounter: Payer: Self-pay | Admitting: Nurse Practitioner

## 2019-08-09 ENCOUNTER — Ambulatory Visit (INDEPENDENT_AMBULATORY_CARE_PROVIDER_SITE_OTHER): Payer: Medicare HMO | Admitting: Nurse Practitioner

## 2019-08-09 VITALS — BP 131/67 | HR 91 | Temp 96.6°F | Resp 16 | Ht 67.0 in | Wt 130.2 lb

## 2019-08-09 DIAGNOSIS — I10 Essential (primary) hypertension: Secondary | ICD-10-CM | POA: Diagnosis not present

## 2019-08-09 DIAGNOSIS — E1142 Type 2 diabetes mellitus with diabetic polyneuropathy: Secondary | ICD-10-CM | POA: Diagnosis not present

## 2019-08-09 DIAGNOSIS — M545 Low back pain, unspecified: Secondary | ICD-10-CM

## 2019-08-09 DIAGNOSIS — E782 Mixed hyperlipidemia: Secondary | ICD-10-CM | POA: Diagnosis not present

## 2019-08-09 LAB — POCT GLYCOSYLATED HEMOGLOBIN (HGB A1C): Hemoglobin A1C: 6.9 % — AB (ref 4.0–5.6)

## 2019-08-09 MED ORDER — HYDROCODONE-ACETAMINOPHEN 5-325 MG PO TABS: 1.0000 | ORAL_TABLET | Freq: Four times a day (QID) | ORAL | 0 refills | Status: DC | PRN

## 2019-08-09 MED ORDER — OZEMPIC (1 MG/DOSE) 2 MG/1.5ML ~~LOC~~ SOPN: 1.0000 mg | PEN_INJECTOR | SUBCUTANEOUS | 3 refills | Status: AC

## 2019-08-09 NOTE — Progress Notes (Signed)
[facility] [place], [place] [address]  Internal MEDICINE  Office Visit Note  Patient Name: Alexandra Yang  [id number]  NR:[id number]  Date of Service: 08/09/2019  Chief Complaint  Patient presents with  . Diabetes  . Gastroesophageal Reflux  . Arthritis  . Quality Metric Gaps    mammogram, colonoscopy, pna vacc    The patient is here for routine follow up. Blood sugars doing very well. Hgba1c 6.9 today. She does need to have refills for toujeo and ozempic. Blood pressure is well controlled. She is having trouble with pain/inflammation in her right hand. This is hand she has fractured in the past.  She does take her lyrica at night, but sometimes this does not help. She is also having increased neuropathy in her feet. Does use voltaren gel on her feet and right hand. She states that she is able to take hydrocodone/APAP which does help. A short term prescription for hydrocodone will last her nearly one year. She would also like to have a prescription for diabetic shoes. She does have insulin dependant diabetes and has peripheral neuropathy in both feet.       Current Medication: Outpatient Encounter Medications as of 08/09/2019  Medication Sig  . albuterol (PROVENTIL) (2.5 MG/3ML) 0.083% nebulizer solution Take 2.5 mg by nebulization every 6 (six) hours as needed for wheezing or shortness of breath.  Marland Kitchen aspirin 81 MG chewable tablet Chew 81 mg by mouth every evening.   . cholecalciferol (VITAMIN D) 1000 UNITS tablet Take 1,000 Units by mouth daily.  Marland Kitchen CRANBERRY PO Take by mouth.  Marland Kitchen glucose blood (ACCU-CHEK GUIDE) test strip Blood sugars tested QD and prn.  Dx - E11.65  . HYDROcodone-acetaminophen (NORCO/VICODIN) 5-325 MG tablet Take 1 tablet by mouth every 6 (six) hours as needed for moderate pain.  Marland Kitchen ibuprofen (ADVIL) 800 MG tablet Take 1 tablet (800 mg total) by mouth 3 (three) times daily as needed.  . Insulin Pen Needle 32G X 4 MM MISC USE AS ONCE DAILY  .  ipratropium-albuterol (DUONEB) 0.5-2.5 (3) MG/3ML SOLN Use as directed three time a day diag j44.9  . lansoprazole (PREVACID) 30 MG capsule Take 1 capsule (30 mg total) by mouth daily at 12 noon.  Marland Kitchen lisinopril (ZESTRIL) 10 MG tablet Take 1 tablet (10 mg total) by mouth daily.  . metFORMIN (GLUCOPHAGE-XR) 500 MG 24 hr tablet TAKE 1 TABLET EVERY NIGHT  . Multiple Vitamins-Minerals (CENTRUM SILVER PO) Take 1 tablet by mouth daily.  . ondansetron (ZOFRAN ODT) 4 MG disintegrating tablet Take 1 tablet (4 mg total) by mouth every 8 (eight) hours as needed for nausea or vomiting.  . ondansetron (ZOFRAN) 4 MG tablet Take 1 tablet (4 mg total) by mouth every 8 (eight) hours as needed for nausea or vomiting.  . pregabalin (LYRICA) 75 MG capsule Take 1 capsule (75 mg total) by mouth at bedtime as needed (pain).  . Probiotic Product (PROBIOTIC PO) Take by mouth.  . Semaglutide, 1 MG/DOSE, (OZEMPIC, 1 MG/DOSE,) 2 MG/1.5ML SOPN Inject 1 mg into the skin once a week.  . simvastatin (ZOCOR) 40 MG tablet Take 1 tablet (40 mg total) by mouth at bedtime.  . vitamin B-12 (CYANOCOBALAMIN) 1000 MCG tablet Take 1,000 mcg by mouth daily.   . [DISCONTINUED] HYDROcodone-acetaminophen (NORCO/VICODIN) 5-325 MG tablet Take 1 tablet by mouth every 6 (six) hours as needed for moderate pain.  . [DISCONTINUED] Semaglutide, 1 MG/DOSE, (OZEMPIC, 1 MG/DOSE,) 2 MG/1.5ML SOPN Inject 1 mg into the  skin once a week.  . Insulin Glargine (TOUJEO MAX SOLOSTAR) 300 UNIT/ML SOPN Inject 24 Units into the skin 1 day or 1 dose for 1 dose.  . [DISCONTINUED] nitrofurantoin, macrocrystal-monohydrate, (MACROBID) 100 MG capsule Take 1 capsule (100 mg total) by mouth 2 (two) times daily. (Patient not taking: Reported on 08/09/2019)   No facility-administered encounter medications on file as of 08/09/2019.    Surgical History: Past Surgical History:  Procedure Laterality Date  . ABDOMINAL HYSTERECTOMY    . CHOLECYSTECTOMY    . EYE SURGERY    .  KNEE ARTHROSCOPY Right   . TENDON TRANSFER Right 01/20/2015   Procedure: RIGHT HAND AND MIDDLE FINGER TENDON RECONSTRUCTION AND TRANSFER;  Surgeon: Roseanne Kaufman, MD;  Location: [facility];  Service: Orthopedics;  Laterality: Right;    Medical History: Past Medical History:  Diagnosis Date  . Arthritis    osteoarthritis  . Cancer ([place]) 1995   endometrial ca  . Cataract   . COPD (chronic obstructive pulmonary disease) ([place])   . Diabetes mellitus without complication ([place])    type 2  . GERD (gastroesophageal reflux disease)   . History of kidney infection    had to have PICC line  . Neuropathy    both feet  . Osteopenia   . Osteoporosis   . Pneumonia     Family History: Family History  Problem Relation Age of Onset  . Pancreatic cancer Mother   . Cancer Mother        pancreaitis  . Lung cancer Father   . Breast cancer Sister 35    Social History   Socioeconomic History  . Marital status: Widowed    Spouse name: Not on file  . Number of children: Not on file  . Years of education: Not on file  . Highest education level: Not on file  Occupational History  . Not on file  Tobacco Use  . Smoking status: Current Every Day Smoker    Packs/day: 0.50    Years: 40.00    Pack years: 20.00    Types: Cigarettes  . Smokeless tobacco: Never Used  Substance and Sexual Activity  . Alcohol use: Yes    Comment: occasional  . Drug use: No  . Sexual activity: Not on file  Other Topics Concern  . Not on file  Social History Narrative  . Not on file   Social Determinants of Health   Financial Resource Strain:   . Difficulty of Paying Living Expenses:   Food Insecurity:   . Worried About Charity fundraiser in the Last Year:   . Arboriculturist in the Last Year:   Transportation Needs:   . Film/video editor (Medical):   Marland Kitchen Lack of Transportation (Non-Medical):   Physical Activity:   . Days of Exercise per Week:   . Minutes of Exercise per Session:   Stress:   .  Feeling of Stress :   Social Connections:   . Frequency of Communication with Friends and Family:   . Frequency of Social Gatherings with Friends and Family:   . Attends Religious Services:   . Active Member of Clubs or Organizations:   . Attends Archivist Meetings:   Marland Kitchen Marital Status:   Intimate Partner Violence:   . Fear of Current or Ex-Partner:   . Emotionally Abused:   Marland Kitchen Physically Abused:   . Sexually Abused:       Review of Systems  Constitutional: Negative for activity change,  chills, fatigue and unexpected weight change.  HENT: Negative for congestion, postnasal drip, rhinorrhea, sneezing and sore throat.   Respiratory: Negative for cough, chest tightness, shortness of breath and wheezing.   Cardiovascular: Negative for chest pain and palpitations.  Gastrointestinal: Negative for abdominal pain, constipation, diarrhea, nausea and vomiting.  Endocrine: Negative for cold intolerance, heat intolerance, polydipsia and polyuria.       Blood sugars doing well   Musculoskeletal: Positive for arthralgias, myalgias, neck pain and neck stiffness. Negative for back pain and joint swelling.  Skin: Negative for rash.  Neurological: Positive for headaches. Negative for dizziness, tremors and numbness.  Hematological: Negative for adenopathy. Does not bruise/bleed easily.  Psychiatric/Behavioral: Negative for behavioral problems (Depression), sleep disturbance and suicidal ideas. The patient is not nervous/anxious.     Today's Vitals   08/09/19 0954  BP: 131/67  Pulse: 91  Resp: 16  Temp: (!) 96.6 F (35.9 C)  SpO2: 97%  Weight: 130 lb 3.2 oz (59.1 kg)  Height: 5\' 7"  (1.702 m)   Body mass index is 20.39 kg/m.  Physical Exam Vitals and nursing note reviewed.  Constitutional:      General: She is not in acute distress.    Appearance: Normal appearance. She is well-developed. She is not diaphoretic.  HENT:     Head: Normocephalic and atraumatic.      Mouth/Throat:     Pharynx: No oropharyngeal exudate.  Eyes:     Pupils: Pupils are equal, round, and reactive to light.  Neck:     Thyroid: No thyromegaly.     Vascular: No JVD.     Trachea: No tracheal deviation.  Cardiovascular:     Rate and Rhythm: Normal rate and regular rhythm.     Heart sounds: Normal heart sounds. No murmur. No friction rub. No gallop.   Pulmonary:     Effort: Pulmonary effort is normal. No respiratory distress.     Breath sounds: Normal breath sounds. No wheezing or rales.  Chest:     Chest wall: No tenderness.  Abdominal:     Palpations: Abdomen is soft.  Musculoskeletal:        General: Normal range of motion.     Cervical back: Normal range of motion and neck supple.     Comments: Tenderness and swelling of right wrist. Has limited ROM due to pain and some swelling. Grip is weak on right side .  Lymphadenopathy:     Cervical: No cervical adenopathy.  Skin:    General: Skin is warm and dry.  Neurological:     Mental Status: She is alert and oriented to person, place, and time.     Cranial Nerves: No cranial nerve deficit.  Psychiatric:        Behavior: Behavior normal.        Thought Content: Thought content normal.        Judgment: Judgment normal.    Assessment/Plan:  1. Type 2 diabetes mellitus with peripheral neuropathy (HCC) - POCT HgB A1C 6.9 today. She should continue diabetic medication as prescribed. Prescription for diabetic shoes given to patient. Will fill out paperwork when available. Continue to take lyrica as prescribed  - Semaglutide, 1 MG/DOSE, (OZEMPIC, 1 MG/DOSE,) 2 MG/1.5ML SOPN; Inject 1 mg into the skin once a week.  Dispense: 3 mL; Refill: 3  2. Essential hypertension Stable. Continue bp medication as prescribed   3. Acute low back pain without sciatica, unspecified back pain laterality Short term prescription for hydrocodone/APAP 5/325mg  tablets given -  may take as needed and no more than prescribed.  -  HYDROcodone-acetaminophen (NORCO/VICODIN) 5-325 MG tablet; Take 1 tablet by mouth every 6 (six) hours as needed for moderate pain.  Dispense: 30 tablet; Refill: 0  4. Mixed hyperlipidemia Continue simvastatin as prescribed   General Counseling: Sandy Salaam understanding of the findings of todays visit and agrees with plan of treatment. I have discussed any further diagnostic evaluation that may be needed or ordered today. We also reviewed her medications today. she has been encouraged to call the office with any questions or concerns that should arise related to todays visit.  Diabetes Counseling:  1. Addition of ACE inh/ ARB'S for nephroprotection. Microalbumin is updated  2. Diabetic foot care, prevention of complications. Podiatry consult 3. Exercise and lose weight.  4. Diabetic eye examination, Diabetic eye exam is updated  5. Monitor blood sugar closlely. nutrition counseling.  6. Sign and symptoms of hypoglycemia including shaking sweating,confusion and headaches.  This patient was seen by Leretha Pol FNP Collaboration with Dr Lavera Guise as a part of collaborative care agreement  Orders Placed This Encounter  Procedures  . POCT HgB A1C    Meds ordered this encounter  Medications  . Semaglutide, 1 MG/DOSE, (OZEMPIC, 1 MG/DOSE,) 2 MG/1.5ML SOPN    Sig: Inject 1 mg into the skin once a week.    Dispense:  3 mL    Refill:  3    Order Specific Question:   Supervising Provider    Answer:   Lavera Guise [id number]  . HYDROcodone-acetaminophen (NORCO/VICODIN) 5-325 MG tablet    Sig: Take 1 tablet by mouth every 6 (six) hours as needed for moderate pain.    Dispense:  30 tablet    Refill:  0    Order Specific Question:   Supervising Provider    Answer:   Lavera Guise [id number]    Total time spent: 30 Minutes   Time spent includes review of chart, medications, test results, and follow up plan with the patient.      Dr Lavera Guise Internal medicine

## 2019-08-22 ENCOUNTER — Telehealth: Payer: Self-pay

## 2019-08-22 NOTE — Telephone Encounter (Signed)
ORDER FOR DIABETIC SHOES SIGNED AND FAXED BACK TO [facility].

## 2019-09-05 DIAGNOSIS — E119 Type 2 diabetes mellitus without complications: Secondary | ICD-10-CM | POA: Diagnosis not present

## 2019-10-03 ENCOUNTER — Other Ambulatory Visit: Payer: Self-pay

## 2019-10-03 MED ORDER — TOUJEO MAX SOLOSTAR 300 UNIT/ML ~~LOC~~ SOPN: 10.0000 [IU] | PEN_INJECTOR | Freq: Every day | SUBCUTANEOUS | 3 refills | Status: AC

## 2019-10-11 DIAGNOSIS — E119 Type 2 diabetes mellitus without complications: Secondary | ICD-10-CM | POA: Diagnosis not present

## 2019-10-11 DIAGNOSIS — H53002 Unspecified amblyopia, left eye: Secondary | ICD-10-CM | POA: Diagnosis not present

## 2019-10-11 DIAGNOSIS — H1045 Other chronic allergic conjunctivitis: Secondary | ICD-10-CM | POA: Diagnosis not present

## 2019-10-24 ENCOUNTER — Other Ambulatory Visit: Payer: Self-pay

## 2019-10-24 DIAGNOSIS — E1142 Type 2 diabetes mellitus with diabetic polyneuropathy: Secondary | ICD-10-CM

## 2019-10-24 MED ORDER — ACCU-CHEK GUIDE VI STRP: ORAL_STRIP | 12 refills | Status: DC

## 2019-11-02 ENCOUNTER — Other Ambulatory Visit: Payer: Self-pay

## 2019-11-02 DIAGNOSIS — K219 Gastro-esophageal reflux disease without esophagitis: Secondary | ICD-10-CM

## 2019-11-02 MED ORDER — LANSOPRAZOLE 30 MG PO CPDR: 30.0000 mg | DELAYED_RELEASE_CAPSULE | Freq: Every day | ORAL | 1 refills | Status: AC

## 2019-11-08 ENCOUNTER — Telehealth: Payer: Self-pay

## 2019-11-08 ENCOUNTER — Other Ambulatory Visit: Payer: Self-pay

## 2019-11-08 DIAGNOSIS — E1142 Type 2 diabetes mellitus with diabetic polyneuropathy: Secondary | ICD-10-CM

## 2019-11-08 NOTE — Telephone Encounter (Signed)
Lmom to confirm and screen for 11-10-19 ov.

## 2019-11-08 NOTE — Telephone Encounter (Signed)
Confirmed and screened for 11-10-19 ov. 

## 2019-11-09 MED ORDER — PREGABALIN 75 MG PO CAPS: 75.0000 mg | ORAL_CAPSULE | Freq: Every evening | ORAL | 3 refills | Status: DC | PRN

## 2019-11-10 ENCOUNTER — Other Ambulatory Visit: Payer: Self-pay

## 2019-11-10 ENCOUNTER — Ambulatory Visit (INDEPENDENT_AMBULATORY_CARE_PROVIDER_SITE_OTHER): Payer: Medicare HMO | Admitting: Nurse Practitioner

## 2019-11-10 ENCOUNTER — Encounter: Payer: Self-pay | Admitting: Nurse Practitioner

## 2019-11-10 VITALS — BP 185/83 | HR 91 | Temp 97.2°F | Resp 16 | Ht 67.0 in | Wt 129.4 lb

## 2019-11-10 DIAGNOSIS — I10 Essential (primary) hypertension: Secondary | ICD-10-CM | POA: Diagnosis not present

## 2019-11-10 DIAGNOSIS — Z0001 Encounter for general adult medical examination with abnormal findings: Secondary | ICD-10-CM | POA: Diagnosis not present

## 2019-11-10 DIAGNOSIS — R300 Dysuria: Secondary | ICD-10-CM | POA: Diagnosis not present

## 2019-11-10 DIAGNOSIS — E1142 Type 2 diabetes mellitus with diabetic polyneuropathy: Secondary | ICD-10-CM

## 2019-11-10 DIAGNOSIS — E1165 Type 2 diabetes mellitus with hyperglycemia: Secondary | ICD-10-CM

## 2019-11-10 DIAGNOSIS — F17200 Nicotine dependence, unspecified, uncomplicated: Secondary | ICD-10-CM

## 2019-11-10 DIAGNOSIS — Z23 Encounter for immunization: Secondary | ICD-10-CM | POA: Diagnosis not present

## 2019-11-10 LAB — POCT GLYCOSYLATED HEMOGLOBIN (HGB A1C): Hemoglobin A1C: 7 % — AB (ref 4.0–5.6)

## 2019-11-10 MED ORDER — LISINOPRIL 20 MG PO TABS: 20.0000 mg | ORAL_TABLET | Freq: Every day | ORAL | 1 refills | Status: DC

## 2019-11-10 MED ORDER — PREGABALIN 100 MG PO CAPS: 100.0000 mg | ORAL_CAPSULE | Freq: Every evening | ORAL | 3 refills | Status: AC | PRN

## 2019-11-10 MED ORDER — PNEUMOCOCCAL 13-VAL CONJ VACC IM SUSP: 0.5000 mL | Freq: Once | INTRAMUSCULAR | 0 refills | Status: AC

## 2019-11-10 NOTE — Progress Notes (Signed)
[facility] [place], [place] [address]  Internal MEDICINE  Office Visit Note  Patient Name: Alexandra Yang  [id number]  [id number]  Date of Service: 11/16/2019   Pt is here for routine health maintenance examination  Chief Complaint  Patient presents with  . Medicare Wellness  . Diabetes  . Gastroesophageal Reflux  . COPD     The patient is here for health maintenance exam. She is diabetic and her blood sugars are doing well. Her HgbA1c is 7.0 today. Blood pressure is moderately elevated. She is also having increased neuropathy type sensation in both feet. They are numb but also feel as though they are burning. She is currently taking Lyrica 75mg  twice daily. This did help neuropathy for some time.  She is due to have routine, fasting labs. She should be scheduled for screening mammogram on or after 01/05/2020.     Current Medication: Outpatient Encounter Medications as of 11/10/2019  Medication Sig  . albuterol (PROVENTIL) (2.5 MG/3ML) 0.083% nebulizer solution Take 2.5 mg by nebulization every 6 (six) hours as needed for wheezing or shortness of breath.  Marland Kitchen aspirin 81 MG chewable tablet Chew 81 mg by mouth every evening.   . cholecalciferol (VITAMIN D) 1000 UNITS tablet Take 1,000 Units by mouth daily.  Marland Kitchen CRANBERRY PO Take by mouth.  Marland Kitchen glucose blood (ACCU-CHEK GUIDE) test strip Blood sugars tested QD and prn.  Dx - E11.65  . HYDROcodone-acetaminophen (NORCO/VICODIN) 5-325 MG tablet Take 1 tablet by mouth every 6 (six) hours as needed for moderate pain.  Marland Kitchen ibuprofen (ADVIL) 800 MG tablet Take 1 tablet (800 mg total) by mouth 3 (three) times daily as needed.  . insulin glargine, 2 Unit Dial, (TOUJEO MAX SOLOSTAR) 300 UNIT/ML Solostar Pen Inject 10 Units into the skin at bedtime.  . Insulin Pen Needle 32G X 4 MM MISC USE AS ONCE DAILY  . ipratropium-albuterol (DUONEB) 0.5-2.5 (3) MG/3ML SOLN Use as directed three time a day diag j44.9  . lansoprazole  (PREVACID) 30 MG capsule Take 1 capsule (30 mg total) by mouth daily at 12 noon.  Marland Kitchen lisinopril (ZESTRIL) 20 MG tablet Take 1 tablet (20 mg total) by mouth daily.  . metFORMIN (GLUCOPHAGE-XR) 500 MG 24 hr tablet TAKE 1 TABLET EVERY NIGHT  . Multiple Vitamins-Minerals (CENTRUM SILVER PO) Take 1 tablet by mouth daily.  . ondansetron (ZOFRAN ODT) 4 MG disintegrating tablet Take 1 tablet (4 mg total) by mouth every 8 (eight) hours as needed for nausea or vomiting.  . ondansetron (ZOFRAN) 4 MG tablet Take 1 tablet (4 mg total) by mouth every 8 (eight) hours as needed for nausea or vomiting.  . pregabalin (LYRICA) 100 MG capsule Take 1 capsule (100 mg total) by mouth at bedtime as needed (pain).  . Probiotic Product (PROBIOTIC PO) Take by mouth.  . Semaglutide, 1 MG/DOSE, (OZEMPIC, 1 MG/DOSE,) 2 MG/1.5ML SOPN Inject 1 mg into the skin once a week.  . simvastatin (ZOCOR) 40 MG tablet Take 1 tablet (40 mg total) by mouth at bedtime.  . vitamin B-12 (CYANOCOBALAMIN) 1000 MCG tablet Take 1,000 mcg by mouth daily.   . [DISCONTINUED] lisinopril (ZESTRIL) 10 MG tablet Take 1 tablet (10 mg total) by mouth daily.  . [DISCONTINUED] pregabalin (LYRICA) 75 MG capsule Take 1 capsule (75 mg total) by mouth at bedtime as needed (pain).  . [EXPIRED] pneumococcal 13-valent conjugate vaccine (PREVNAR 13) SUSP injection Inject 0.5 mLs into the muscle once for 1 dose.   No  facility-administered encounter medications on file as of 11/10/2019.    Surgical History: Past Surgical History:  Procedure Laterality Date  . ABDOMINAL HYSTERECTOMY    . CHOLECYSTECTOMY    . EYE SURGERY    . KNEE ARTHROSCOPY Right   . TENDON TRANSFER Right 01/20/2015   Procedure: RIGHT HAND AND MIDDLE FINGER TENDON RECONSTRUCTION AND TRANSFER;  Surgeon: Roseanne Kaufman, MD;  Location: [facility];  Service: Orthopedics;  Laterality: Right;    Medical History: Past Medical History:  Diagnosis Date  . Arthritis    osteoarthritis  . Cancer ([place]) 1995    endometrial ca  . Cataract   . COPD (chronic obstructive pulmonary disease) ([place])   . Diabetes mellitus without complication ([place])    type 2  . GERD (gastroesophageal reflux disease)   . History of kidney infection    had to have PICC line  . Neuropathy    both feet  . Osteopenia   . Osteoporosis   . Pneumonia     Family History: Family History  Problem Relation Age of Onset  . Pancreatic cancer Mother   . Cancer Mother        pancreaitis  . Lung cancer Father   . Breast cancer Sister 107      Review of Systems  Constitutional: Positive for fatigue. Negative for activity change, chills and unexpected weight change.  HENT: Negative for congestion, postnasal drip, rhinorrhea, sneezing and sore throat.   Respiratory: Negative for cough, chest tightness, shortness of breath and wheezing.   Cardiovascular: Negative for chest pain and palpitations.       Blood pressure elevated today.   Gastrointestinal: Negative for abdominal pain, constipation, diarrhea, nausea and vomiting.  Endocrine: Negative for cold intolerance, heat intolerance, polydipsia and polyuria.       Blood sugars doing well   Genitourinary: Negative for dysuria, frequency, hematuria and urgency.  Musculoskeletal: Positive for arthralgias, myalgias, neck pain and neck stiffness. Negative for back pain and joint swelling.  Skin: Negative for rash.  Allergic/Immunologic: Negative for environmental allergies.  Neurological: Positive for numbness and headaches. Negative for dizziness and tremors.       Having increased numbness and peripheral neuropathy in both feet.   Hematological: Negative for adenopathy. Does not bruise/bleed easily.  Psychiatric/Behavioral: Negative for behavioral problems (Depression), sleep disturbance and suicidal ideas. The patient is not nervous/anxious.      Today's Vitals   11/10/19 1047  BP: (!) 185/83  Pulse: 91  Resp: 16  Temp: (!) 97.2 F (36.2 C)  SpO2: 98%  Weight: 129  lb 6.4 oz (58.7 kg)  Height: 5\' 7"  (1.702 m)   Body mass index is 20.27 kg/m.  Physical Exam Vitals and nursing note reviewed.  Constitutional:      General: She is not in acute distress.    Appearance: Normal appearance. She is well-developed. She is not diaphoretic.  HENT:     Head: Normocephalic and atraumatic.     Nose: Nose normal.     Mouth/Throat:     Pharynx: No oropharyngeal exudate.  Eyes:     Pupils: Pupils are equal, round, and reactive to light.  Neck:     Thyroid: No thyromegaly.     Vascular: No carotid bruit or JVD.     Trachea: No tracheal deviation.  Cardiovascular:     Rate and Rhythm: Normal rate and regular rhythm.     Pulses: Normal pulses.          Dorsalis pedis pulses are  2+ on the right side and 2+ on the left side.       Posterior tibial pulses are 2+ on the right side and 2+ on the left side.     Heart sounds: Normal heart sounds. No murmur heard.  No friction rub. No gallop.   Pulmonary:     Effort: Pulmonary effort is normal. No respiratory distress.     Breath sounds: Normal breath sounds. No wheezing or rales.  Chest:     Chest wall: No tenderness.     Breasts:        Right: No swelling, bleeding, inverted nipple, mass, nipple discharge, skin change or tenderness.        Left: No swelling, bleeding, inverted nipple, mass, nipple discharge, skin change or tenderness.  Abdominal:     General: Bowel sounds are normal.     Palpations: Abdomen is soft.     Tenderness: There is no abdominal tenderness.  Musculoskeletal:        General: Normal range of motion.     Cervical back: Normal range of motion and neck supple.     Right foot: Normal range of motion. No deformity or bunion.     Left foot: Normal range of motion. No deformity or bunion.  Feet:     Right foot:     Protective Sensation: 10 sites tested. 6 sites sensed.     Skin integrity: Skin integrity normal.     Toenail Condition: Right toenails are normal.     Left foot:      Protective Sensation: 10 sites tested. 6 sites sensed.     Skin integrity: Skin integrity normal.     Toenail Condition: Left toenails are normal.  Lymphadenopathy:     Cervical: No cervical adenopathy.     Upper Body:     Right upper body: No axillary adenopathy.     Left upper body: No axillary adenopathy.  Skin:    General: Skin is warm and dry.  Neurological:     General: No focal deficit present.     Mental Status: She is alert and oriented to person, place, and time.     Cranial Nerves: No cranial nerve deficit.  Psychiatric:        Mood and Affect: Mood normal.        Behavior: Behavior normal.        Thought Content: Thought content normal.        Judgment: Judgment normal.    Depression screen [facility] 2/9 11/10/2019 08/09/2019 05/10/2019 02/09/2019 11/04/2018  Decreased Interest 0 0 0 0 0  Down, Depressed, Hopeless 0 0 0 0 0  PHQ - 2 Score 0 0 0 0 0    Functional Status Survey: Is the patient deaf or have difficulty hearing?: No Does the patient have difficulty seeing, even when wearing glasses/contacts?: No Does the patient have difficulty concentrating, remembering, or making decisions?: No Does the patient have difficulty walking or climbing stairs?: No Does the patient have difficulty dressing or bathing?: No Does the patient have difficulty doing errands alone such as visiting a doctor's office or shopping?: No  MMSE - [place] Exam 11/10/2019 11/04/2018 10/27/2017  Orientation to time 5 5 5   Orientation to Place 5 5 5   Registration 3 3 3   Attention/ Calculation 5 5 5   Recall 3 3 3   Language- name 2 objects 2 2 2   Language- repeat 1 1 1   Language- follow 3 step command 3 3 3  Language- read & follow direction 1 1 1   Write a sentence 1 1 1   Copy design 1 1 1   Total score 30 30 30     Fall Risk  11/10/2019 08/09/2019 05/10/2019 02/09/2019 11/04/2018  Falls in the past year? 0 0 0 1 0  Number falls in past yr: - - 0 0 -  Injury with Fall? - - 0 0 -       LABS: Recent Results (from the past 2160 hour(s))  Urinalysis, Routine w reflex microscopic     Status: Abnormal   Collection Time: 11/10/19 10:47 AM  Result Value Ref Range   Specific Gravity, UA 1.021 1.005 - 1.030   pH, UA 6.5 5.0 - 7.5   Color, UA Yellow Yellow   Appearance Ur Cloudy (A) Clear   Leukocytes,UA 2+ (A) Negative   Protein,UA Trace Negative/Trace   Glucose, UA Negative Negative   Ketones, UA Negative Negative   RBC, UA Negative Negative   Bilirubin, UA Negative Negative   Urobilinogen, Ur 0.2 0.2 - 1.0 mg/dL   Nitrite, UA Negative Negative   Microscopic Examination See below:     Comment: Microscopic was indicated and was performed.  Microscopic Examination     Status: Abnormal   Collection Time: 11/10/19 10:47 AM   Urine  Result Value Ref Range   WBC, UA >30 (A) 0 - 5 /hpf   RBC 0-2 0 - 2 /hpf   Epithelial Cells (non renal) 0-10 0 - 10 /hpf   Casts None seen None seen /lpf   Bacteria, UA Many (A) None seen/Few  POCT HgB A1C     Status: Abnormal   Collection Time: 11/10/19 11:06 AM  Result Value Ref Range   Hemoglobin A1C 7.0 (A) 4.0 - 5.6 %   HbA1c POC (<> result, manual entry)     HbA1c, POC (prediabetic range)     HbA1c, POC (controlled diabetic range)      Assessment/Plan: 1. Encounter for general adult medical examination with abnormal findings Annual health maintenance exam today. Order slip given to have routine, fasting labs done.   2. Type 2 diabetes mellitus with hyperglycemia, unspecified whether long term insulin use (HCC) - POCT HgB A1C 7.0 today. Continue diabetic medication as prescribed.   3. Type 2 diabetes mellitus with peripheral neuropathy (HCC) Increase dose of lyrica to 100mg  at bedtime. Reassess at next visit.  - pregabalin (LYRICA) 100 MG capsule; Take 1 capsule (100 mg total) by mouth at bedtime as needed (pain).  Dispense: 30 capsule; Refill: 3  4. Essential hypertension Increase lisinopril to 20mg  daily.  -  lisinopril (ZESTRIL) 20 MG tablet; Take 1 tablet (20 mg total) by mouth daily.  Dispense: 90 tablet; Refill: 1  5. Need for vaccination against Streptococcus pneumoniae using pneumococcal conjugate vaccine 13 Prescription for Prevnar 13 sent to her pharmacy for administration.  - pneumococcal 13-valent conjugate vaccine (PREVNAR 13) SUSP injection; Inject 0.5 mLs into the muscle once for 1 dose.  Dispense: 0.5 mL; Refill: 0  6. Nicotine dependence with current use Encouraged smoking cessation.   7. Dysuria - Urinalysis, Routine w reflex microscopic  General Counseling: Sandy Salaam understanding of the findings of todays visit and agrees with plan of treatment. I have discussed any further diagnostic evaluation that may be needed or ordered today. We also reviewed her medications today. she has been encouraged to call the office with any questions or concerns that should arise related to todays visit.    Counseling:  Hypertension Counseling:   The following hypertensive lifestyle modification were recommended and discussed:  1. Limiting alcohol intake to less than 1 oz/day of ethanol:(24 oz of beer or 8 oz of wine or 2 oz of 100-proof whiskey). 2. Take baby ASA 81 mg daily. 3. Importance of regular aerobic exercise and losing weight. 4. Reduce dietary saturated fat and cholesterol intake for overall cardiovascular health. 5. Maintaining adequate dietary potassium, calcium, and magnesium intake. 6. Regular monitoring of the blood pressure. 7. Reduce sodium intake to less than 100 mmol/day (less than 2.3 gm of sodium or less than 6 gm of sodium choride)   This patient was seen by [facility] with Dr Lavera Guise as a part of collaborative care agreement  Orders Placed This Encounter  Procedures  . Microscopic Examination  . Urinalysis, Routine w reflex microscopic  . POCT HgB A1C    Meds ordered this encounter  Medications  . pneumococcal 13-valent  conjugate vaccine (PREVNAR 13) SUSP injection    Sig: Inject 0.5 mLs into the muscle once for 1 dose.    Dispense:  0.5 mL    Refill:  0    Order Specific Question:   Supervising Provider    Answer:   Lavera Guise [[id number]]  . pregabalin (LYRICA) 100 MG capsule    Sig: Take 1 capsule (100 mg total) by mouth at bedtime as needed (pain).    Dispense:  30 capsule    Refill:  3    Order Specific Question:   Supervising Provider    Answer:   Lavera Guise [[id number]]  . lisinopril (ZESTRIL) 20 MG tablet    Sig: Take 1 tablet (20 mg total) by mouth daily.    Dispense:  90 tablet    Refill:  1    Please note increased dose.    Order Specific Question:   Supervising Provider    Answer:   Lavera Guise [[id number]]    Total time spent: 49 Minutes  Time spent includes review of chart, medications, test results, and follow up plan with the patient.     Lavera Guise, MD  Internal Medicine

## 2019-11-11 ENCOUNTER — Telehealth: Payer: Self-pay

## 2019-11-11 LAB — URINALYSIS, ROUTINE W REFLEX MICROSCOPIC
Bilirubin, UA: NEGATIVE
Glucose, UA: NEGATIVE
Ketones, UA: NEGATIVE
Nitrite, UA: NEGATIVE
RBC, UA: NEGATIVE
Specific Gravity, UA: 1.021 (ref 1.005–1.030)
Urobilinogen, Ur: 0.2 mg/dL (ref 0.2–1.0)
pH, UA: 6.5 (ref 5.0–7.5)

## 2019-11-11 LAB — MICROSCOPIC EXAMINATION
Casts: NONE SEEN /lpf
WBC, UA: >30 /hpf — AB (ref 0–5)

## 2019-11-11 NOTE — Telephone Encounter (Signed)
PT states she is taking Simvastatin and gets them through [facility]

## 2019-11-16 DIAGNOSIS — Z0001 Encounter for general adult medical examination with abnormal findings: Secondary | ICD-10-CM | POA: Insufficient documentation

## 2019-11-16 DIAGNOSIS — Z23 Encounter for immunization: Secondary | ICD-10-CM | POA: Insufficient documentation

## 2019-11-17 ENCOUNTER — Telehealth: Payer: Self-pay

## 2019-11-17 NOTE — Telephone Encounter (Signed)
Faxed cologuard 

## 2019-11-25 ENCOUNTER — Other Ambulatory Visit
Admission: RE | Admit: 2019-11-25 | Discharge: 2019-11-25 | Disposition: A | Discharge status: Home / Self Care | Payer: Medicare HMO | Attending: Nurse Practitioner | Admitting: Nurse Practitioner

## 2019-11-25 DIAGNOSIS — Z118 Encounter for screening for other infectious and parasitic diseases: Secondary | ICD-10-CM | POA: Diagnosis not present

## 2019-11-25 DIAGNOSIS — E119 Type 2 diabetes mellitus without complications: Secondary | ICD-10-CM | POA: Insufficient documentation

## 2019-11-25 DIAGNOSIS — D509 Iron deficiency anemia, unspecified: Secondary | ICD-10-CM | POA: Diagnosis not present

## 2019-11-25 DIAGNOSIS — E559 Vitamin D deficiency, unspecified: Secondary | ICD-10-CM | POA: Insufficient documentation

## 2019-11-25 DIAGNOSIS — Z0000 Encounter for general adult medical examination without abnormal findings: Secondary | ICD-10-CM | POA: Insufficient documentation

## 2019-11-25 LAB — CBC WITH DIFFERENTIAL/PLATELET
Abs Immature Granulocytes: 0.02 10*3/uL (ref 0.00–0.07)
Basophils Absolute: 0.1 10*3/uL (ref 0.0–0.1)
Basophils Relative: 1 %
Eosinophils Absolute: 0.3 10*3/uL (ref 0.0–0.5)
Eosinophils Relative: 3 %
HCT: 35.6 % — ABNORMAL LOW (ref 36.0–46.0)
Hemoglobin: 11.7 g/dL — ABNORMAL LOW (ref 12.0–15.0)
Immature Granulocytes: 0 %
Lymphocytes Relative: 28 %
Lymphs Abs: 2.6 10*3/uL (ref 0.7–4.0)
MCH: 31 pg (ref 26.0–34.0)
MCHC: 32.9 g/dL (ref 30.0–36.0)
MCV: 94.4 fL (ref 80.0–100.0)
Monocytes Absolute: 0.5 10*3/uL (ref 0.1–1.0)
Monocytes Relative: 5 %
Neutro Abs: 5.9 10*3/uL (ref 1.7–7.7)
Neutrophils Relative %: 63 %
Platelets: 201 10*3/uL (ref 150–400)
RBC: 3.77 MIL/uL — ABNORMAL LOW (ref 3.87–5.11)
RDW: 12.7 % (ref 11.5–15.5)
WBC: 9.4 10*3/uL (ref 4.0–10.5)
nRBC: 0 % (ref 0.0–0.2)

## 2019-11-25 LAB — COMPREHENSIVE METABOLIC PANEL
ALT: 16 U/L (ref 0–44)
AST: 17 U/L (ref 15–41)
Albumin: 4.2 g/dL (ref 3.5–5.0)
Alkaline Phosphatase: 67 U/L (ref 38–126)
Anion gap: 8 (ref 5–15)
BUN: 39 mg/dL — ABNORMAL HIGH (ref 8–23)
CO2: 25 mmol/L (ref 22–32)
Calcium: 9.6 mg/dL (ref 8.9–10.3)
Chloride: 107 mmol/L (ref 98–111)
Creatinine, Ser: 0.74 mg/dL (ref 0.44–1.00)
GFR calc Af Amer: >60 mL/min (ref >60)
GFR calc non Af Amer: >60 mL/min (ref >60)
Glucose, Bld: 102 mg/dL — ABNORMAL HIGH (ref 70–99)
Potassium: 4.9 mmol/L (ref 3.5–5.1)
Sodium: 140 mmol/L (ref 135–145)
Total Bilirubin: 0.7 mg/dL (ref 0.3–1.2)
Total Protein: 7.7 g/dL (ref 6.5–8.1)

## 2019-11-25 LAB — IRON AND TIBC
Iron: 55 ug/dL (ref 28–170)
Saturation Ratios: 16 % (ref 10.4–31.8)
TIBC: 351 ug/dL (ref 250–450)
UIBC: 296 ug/dL

## 2019-11-25 LAB — HEPATITIS C ANTIBODY: HCV Ab: NONREACTIVE

## 2019-11-25 LAB — LIPID PANEL
Cholesterol: 146 mg/dL (ref 0–200)
HDL: 46 mg/dL (ref >40)
LDL Cholesterol: 84 mg/dL (ref 0–99)
Total CHOL/HDL Ratio: 3.2 RATIO
Triglycerides: 82 mg/dL (ref <150)
VLDL: 16 mg/dL (ref 0–40)

## 2019-11-25 LAB — VITAMIN B12: Vitamin B-12: 2934 pg/mL — ABNORMAL HIGH (ref 180–914)

## 2019-11-25 LAB — TSH: TSH: 0.741 u[IU]/mL (ref 0.350–4.500)

## 2019-11-25 LAB — T4, FREE: Free T4: 0.95 ng/dL (ref 0.61–1.12)

## 2019-11-25 LAB — VITAMIN D 25 HYDROXY (VIT D DEFICIENCY, FRACTURES): Vit D, 25-Hydroxy: 30.98 ng/mL (ref 30–100)

## 2019-11-25 LAB — FOLATE: Folate: 36 ng/mL (ref >5.9)

## 2019-12-02 ENCOUNTER — Other Ambulatory Visit: Payer: Self-pay

## 2019-12-02 DIAGNOSIS — E1142 Type 2 diabetes mellitus with diabetic polyneuropathy: Secondary | ICD-10-CM

## 2019-12-02 MED ORDER — ACCU-CHEK SOFTCLIX LANCETS MISC: 12 refills | Status: AC

## 2019-12-02 MED ORDER — ACCU-CHEK GUIDE VI STRP: ORAL_STRIP | 12 refills | Status: AC

## 2019-12-02 MED ORDER — ACCU-CHEK GUIDE ME W/DEVICE KIT: PACK | 3 refills | Status: AC

## 2019-12-04 NOTE — Progress Notes (Signed)
Review with patient at visit 12/08/2019

## 2019-12-05 ENCOUNTER — Other Ambulatory Visit: Payer: Self-pay

## 2019-12-05 DIAGNOSIS — M064 Inflammatory polyarthropathy: Secondary | ICD-10-CM

## 2019-12-05 DIAGNOSIS — I10 Essential (primary) hypertension: Secondary | ICD-10-CM

## 2019-12-05 DIAGNOSIS — E782 Mixed hyperlipidemia: Secondary | ICD-10-CM

## 2019-12-05 MED ORDER — LISINOPRIL 20 MG PO TABS: 20.0000 mg | ORAL_TABLET | Freq: Every day | ORAL | 1 refills | Status: DC

## 2019-12-05 MED ORDER — SIMVASTATIN 40 MG PO TABS: 40.0000 mg | ORAL_TABLET | Freq: Every day | ORAL | 3 refills | Status: DC

## 2019-12-05 MED ORDER — IBUPROFEN 800 MG PO TABS: 800.0000 mg | ORAL_TABLET | Freq: Three times a day (TID) | ORAL | 2 refills | Status: DC | PRN

## 2019-12-06 ENCOUNTER — Telehealth: Payer: Self-pay

## 2019-12-06 ENCOUNTER — Other Ambulatory Visit: Payer: Self-pay

## 2019-12-06 DIAGNOSIS — E782 Mixed hyperlipidemia: Secondary | ICD-10-CM

## 2019-12-06 DIAGNOSIS — M064 Inflammatory polyarthropathy: Secondary | ICD-10-CM

## 2019-12-06 DIAGNOSIS — I10 Essential (primary) hypertension: Secondary | ICD-10-CM

## 2019-12-06 MED ORDER — METFORMIN HCL ER 500 MG PO TB24: ORAL_TABLET | ORAL | 1 refills | Status: DC

## 2019-12-06 MED ORDER — BD SWAB SINGLE USE REGULAR PADS: MEDICATED_PAD | 3 refills | Status: AC

## 2019-12-06 MED ORDER — SIMVASTATIN 40 MG PO TABS: 40.0000 mg | ORAL_TABLET | Freq: Every day | ORAL | 3 refills | Status: AC

## 2019-12-06 MED ORDER — IBUPROFEN 800 MG PO TABS: 800.0000 mg | ORAL_TABLET | Freq: Three times a day (TID) | ORAL | 2 refills | Status: AC | PRN

## 2019-12-06 MED ORDER — LISINOPRIL 20 MG PO TABS: 20.0000 mg | ORAL_TABLET | Freq: Every day | ORAL | 1 refills | Status: AC

## 2019-12-06 NOTE — Telephone Encounter (Signed)
CONFIRMED PT [email or web address]

## 2019-12-08 ENCOUNTER — Other Ambulatory Visit: Payer: Self-pay

## 2019-12-08 ENCOUNTER — Ambulatory Visit (INDEPENDENT_AMBULATORY_CARE_PROVIDER_SITE_OTHER): Payer: Medicare HMO | Admitting: Nurse Practitioner

## 2019-12-08 ENCOUNTER — Encounter: Payer: Self-pay | Admitting: Nurse Practitioner

## 2019-12-08 VITALS — BP 119/69 | HR 90 | Temp 97.4°F | Resp 16 | Ht 67.0 in | Wt 129.6 lb

## 2019-12-08 DIAGNOSIS — E1165 Type 2 diabetes mellitus with hyperglycemia: Secondary | ICD-10-CM | POA: Diagnosis not present

## 2019-12-08 DIAGNOSIS — N390 Urinary tract infection, site not specified: Secondary | ICD-10-CM | POA: Diagnosis not present

## 2019-12-08 DIAGNOSIS — R300 Dysuria: Secondary | ICD-10-CM

## 2019-12-08 DIAGNOSIS — Z794 Long term (current) use of insulin: Secondary | ICD-10-CM | POA: Diagnosis not present

## 2019-12-08 DIAGNOSIS — M545 Low back pain, unspecified: Secondary | ICD-10-CM

## 2019-12-08 DIAGNOSIS — R319 Hematuria, unspecified: Secondary | ICD-10-CM

## 2019-12-08 LAB — POCT URINALYSIS DIPSTICK
Blood, UA: POSITIVE
Glucose, UA: POSITIVE — AB
Nitrite, UA: POSITIVE
Protein, UA: POSITIVE — AB
Spec Grav, UA: 1.025 (ref 1.010–1.025)
Urobilinogen, UA: 0.2 E.U./dL
pH, UA: 5 (ref 5.0–8.0)

## 2019-12-08 MED ORDER — PHENAZOPYRIDINE HCL 200 MG PO TABS: 200.0000 mg | ORAL_TABLET | Freq: Three times a day (TID) | ORAL | 0 refills | Status: DC | PRN

## 2019-12-08 MED ORDER — HYDROCODONE-ACETAMINOPHEN 5-325 MG PO TABS: 1.0000 | ORAL_TABLET | Freq: Four times a day (QID) | ORAL | 0 refills | Status: DC | PRN

## 2019-12-08 MED ORDER — CEPHALEXIN 500 MG PO CAPS: 500.0000 mg | ORAL_CAPSULE | Freq: Three times a day (TID) | ORAL | 0 refills | Status: AC

## 2019-12-08 NOTE — Progress Notes (Signed)
[facility] [place], [place] [address]  Internal MEDICINE  Office Visit Note  Patient Name: Alexandra Yang  [id number]  [id number]  Date of Service: 01/01/2020  Chief Complaint  Patient presents with  . Follow-up  . Diabetes  . Gastroesophageal Reflux  . COPD  . Back Pain    feels like uti     The patient is here for acute visit. She is having lower abdominal and flank pain. She also has some pain with urination. She has tried her prescribed ibuprofen which has not helped at all. The patient has history of UTI and renal calculi. The symptoms she is having are consistent with infections and stones she has had in the past. She states that she is also having fatigue, nausea, and she is cold nearly all the time. Routine labs were done earlier this month with the addition of an iron panel. She was mildly anemic, but labs were normal. She denies fever.       Current Medication: Outpatient Encounter Medications as of 12/08/2019  Medication Sig  . Accu-Chek Softclix Lancets lancets Use as instructed to check BS everyday and PRN.  E11.65  . albuterol (PROVENTIL) (2.5 MG/3ML) 0.083% nebulizer solution Take 2.5 mg by nebulization every 6 (six) hours as needed for wheezing or shortness of breath.  . Alcohol Swabs (B-D SINGLE USE SWABS REGULAR) PADS Use as directed with insulin injections and testing blood sugars three times daily  . aspirin 81 MG chewable tablet Chew 81 mg by mouth every evening.   . Blood Glucose Monitoring Suppl (ACCU-CHEK GUIDE ME) w/Device KIT Use as directed to check BS every day and PRN  . cholecalciferol (VITAMIN D) 1000 UNITS tablet Take 1,000 Units by mouth daily.  Marland Kitchen CRANBERRY PO Take by mouth.  Marland Kitchen glucose blood (ACCU-CHEK GUIDE) test strip Blood sugars tested QD and prn.  Dx - E11.65  . ibuprofen (ADVIL) 800 MG tablet Take 1 tablet (800 mg total) by mouth 3 (three) times daily as needed.  . insulin glargine, 2 Unit Dial, (TOUJEO MAX  SOLOSTAR) 300 UNIT/ML Solostar Pen Inject 10 Units into the skin at bedtime.  . Insulin Pen Needle 32G X 4 MM MISC USE AS ONCE DAILY  . ipratropium-albuterol (DUONEB) 0.5-2.5 (3) MG/3ML SOLN Use as directed three time a day diag j44.9  . lansoprazole (PREVACID) 30 MG capsule Take 1 capsule (30 mg total) by mouth daily at 12 noon.  Marland Kitchen lisinopril (ZESTRIL) 20 MG tablet Take 1 tablet (20 mg total) by mouth daily.  . Multiple Vitamins-Minerals (CENTRUM SILVER PO) Take 1 tablet by mouth daily.  . pregabalin (LYRICA) 100 MG capsule Take 1 capsule (100 mg total) by mouth at bedtime as needed (pain).  . Probiotic Product (PROBIOTIC PO) Take by mouth.  . Semaglutide, 1 MG/DOSE, (OZEMPIC, 1 MG/DOSE,) 2 MG/1.5ML SOPN Inject 1 mg into the skin once a week.  . simvastatin (ZOCOR) 40 MG tablet Take 1 tablet (40 mg total) by mouth at bedtime.  . vitamin B-12 (CYANOCOBALAMIN) 1000 MCG tablet Take 1,000 mcg by mouth daily.   . [DISCONTINUED] HYDROcodone-acetaminophen (NORCO/VICODIN) 5-325 MG tablet Take 1 tablet by mouth every 6 (six) hours as needed for moderate pain.  . [DISCONTINUED] HYDROcodone-acetaminophen (NORCO/VICODIN) 5-325 MG tablet Take 1 tablet by mouth every 6 (six) hours as needed for moderate pain.  . [DISCONTINUED] metFORMIN (GLUCOPHAGE-XR) 500 MG 24 hr tablet TAKE 1 TABLET EVERY NIGHT  . [DISCONTINUED] ondansetron (ZOFRAN ODT) 4 MG disintegrating tablet Take  1 tablet (4 mg total) by mouth every 8 (eight) hours as needed for nausea or vomiting.  . [DISCONTINUED] ondansetron (ZOFRAN) 4 MG tablet Take 1 tablet (4 mg total) by mouth every 8 (eight) hours as needed for nausea or vomiting.  . cephALEXin (KEFLEX) 500 MG capsule Take 1 capsule (500 mg total) by mouth 3 (three) times daily. (Patient not taking: Reported on 01/14/2020)  . [DISCONTINUED] phenazopyridine (PYRIDIUM) 200 MG tablet Take 1 tablet (200 mg total) by mouth 3 (three) times daily as needed for pain.   No facility-administered encounter  medications on file as of 12/08/2019.    Surgical History: Past Surgical History:  Procedure Laterality Date  . ABDOMINAL HYSTERECTOMY    . CHOLECYSTECTOMY    . EYE SURGERY    . KNEE ARTHROSCOPY Right   . TENDON TRANSFER Right 01/20/2015   Procedure: RIGHT HAND AND MIDDLE FINGER TENDON RECONSTRUCTION AND TRANSFER;  Surgeon: Roseanne Kaufman, MD;  Location: [facility];  Service: Orthopedics;  Laterality: Right;    Medical History: Past Medical History:  Diagnosis Date  . Arthritis    osteoarthritis  . Cancer ([place]) 1995   endometrial ca  . Cataract   . COPD (chronic obstructive pulmonary disease) ([place])   . Diabetes mellitus without complication ([place])    type 2  . GERD (gastroesophageal reflux disease)   . History of kidney infection    had to have PICC line  . Neuropathy    both feet  . Osteopenia   . Osteoporosis   . Pneumonia     Family History: Family History  Problem Relation Age of Onset  . Pancreatic cancer Mother   . Cancer Mother        pancreaitis  . Lung cancer Father   . Breast cancer Sister 28    Social History   Socioeconomic History  . Marital status: Widowed    Spouse name: Not on file  . Number of children: Not on file  . Years of education: Not on file  . Highest education level: Not on file  Occupational History  . Not on file  Tobacco Use  . Smoking status: Current Every Day Smoker    Packs/day: 0.50    Years: 40.00    Pack years: 20.00    Types: Cigarettes  . Smokeless tobacco: Never Used  Substance and Sexual Activity  . Alcohol use: Yes    Comment: occasional  . Drug use: No  . Sexual activity: Not on file  Other Topics Concern  . Not on file  Social History Narrative  . Not on file   Social Determinants of Health   Financial Resource Strain:   . Difficulty of Paying Living Expenses:   Food Insecurity:   . Worried About Charity fundraiser in the Last Year:   . Arboriculturist in the Last Year:   Transportation Needs:   . Consulting civil engineer (Medical):   Marland Kitchen Lack of Transportation (Non-Medical):   Physical Activity:   . Days of Exercise per Week:   . Minutes of Exercise per Session:   Stress:   . Feeling of Stress :   Social Connections:   . Frequency of Communication with Friends and Family:   . Frequency of Social Gatherings with Friends and Family:   . Attends Religious Services:   . Active Member of Clubs or Organizations:   . Attends Archivist Meetings:   Marland Kitchen Marital Status:   Intimate Partner Violence:   .  Fear of Current or Ex-Partner:   . Emotionally Abused:   Marland Kitchen Physically Abused:   . Sexually Abused:       Review of Systems  Constitutional: Positive for appetite change, chills and fatigue. Negative for fever and unexpected weight change.  HENT: Negative for congestion, postnasal drip, rhinorrhea, sneezing and sore throat.   Respiratory: Negative for cough, chest tightness, shortness of breath and wheezing.   Cardiovascular: Negative for chest pain and palpitations.  Gastrointestinal: Positive for nausea. Negative for abdominal pain, constipation, diarrhea and vomiting.  Endocrine: Negative for cold intolerance, heat intolerance, polydipsia and polyuria.  Genitourinary: Positive for dysuria, flank pain, frequency, pelvic pain and urgency.  Musculoskeletal: Positive for arthralgias and back pain. Negative for joint swelling and neck pain.  Skin: Negative for rash.  Allergic/Immunologic: Negative for environmental allergies.  Neurological: Positive for headaches. Negative for tremors and numbness.  Hematological: Negative for adenopathy. Does not bruise/bleed easily.  Psychiatric/Behavioral: Negative for behavioral problems (Depression), sleep disturbance and suicidal ideas. The patient is not nervous/anxious.     Today's Vitals   12/08/19 1035  BP: 119/69  Pulse: 90  Resp: 16  Temp: (!) 97.4 F (36.3 C)  SpO2: 97%  Weight: 129 lb 9.6 oz (58.8 kg)  Height: _0  (1.702 m)    Body mass index is 20.3 kg/m.  Physical Exam Vitals and nursing note reviewed.  Constitutional:      General: She is not in acute distress.    Appearance: She is well-developed. She is ill-appearing. She is not diaphoretic.  HENT:     Head: Normocephalic and atraumatic.     Mouth/Throat:     Pharynx: No oropharyngeal exudate.  Eyes:     Pupils: Pupils are equal, round, and reactive to light.  Neck:     Thyroid: No thyromegaly.     Vascular: No JVD.     Trachea: No tracheal deviation.  Cardiovascular:     Rate and Rhythm: Normal rate and regular rhythm.     Heart sounds: Normal heart sounds. No murmur heard.  No friction rub. No gallop.   Pulmonary:     Effort: Pulmonary effort is normal. No respiratory distress.     Breath sounds: No wheezing or rales.  Chest:     Chest wall: No tenderness.  Abdominal:     General: Bowel sounds are normal.     Palpations: Abdomen is soft.  Genitourinary:    Comments: Urine sample is positive for protein, blood, and nitrites, and positive for large blood.  She also has moderate flank pain with palpation.  Musculoskeletal:        General: Normal range of motion.     Cervical back: Normal range of motion and neck supple.  Lymphadenopathy:     Cervical: No cervical adenopathy.  Skin:    General: Skin is warm and dry.  Neurological:     Mental Status: She is alert and oriented to person, place, and time.     Cranial Nerves: No cranial nerve deficit.  Psychiatric:        Mood and Affect: Mood normal.        Behavior: Behavior normal.        Thought Content: Thought content normal.        Judgment: Judgment normal.    Assessment/Plan: 1. Urinary tract infection with hematuria, site unspecified Start keflex 535m three times daily for the next 10 days. Send urine for culture and sensitivity and adjust antibiotics.  - cephALEXin (KEFLEX)  500 MG capsule; Take 1 capsule (500 mg total) by mouth 3 (three) times daily. (Patient not taking:  Reported on 2019-12-25)  Dispense: 30 capsule; Refill: 0  2. Acute low back pain without sciatica, unspecified back pain laterality Short term prescription for hydrocodone/APAP 5/327m. This may be taken up to three times daily as needed for severe pain.   3. Type 2 diabetes mellitus with hyperglycemia, with long-term current use of insulin ([place] Continue diabetic medication as prescribed   4. Dysuria May take pyridium up to three times daily as needed for bladder pain/spasms.  - POCT Urinalysis Dipstick - CULTURE, URINE COMPREHENSIVE  General Counseling: LSandy Salaamunderstanding of the findings of todays visit and agrees with plan of treatment. I have discussed any further diagnostic evaluation that may be needed or ordered today. We also reviewed her medications today. she has been encouraged to call the office with any questions or concerns that should arise related to todays visit.  Reviewed risks and possible side effects associated with taking opiates, benzodiazepines and other CNS depressants. Combination of these could cause dizziness and drowsiness. Advised patient not to drive or operate machinery when taking these medications, as patient's and other's life can be at risk and will have consequences. Patient verbalized understanding in this matter. Dependence and abuse for these drugs will be monitored closely. A Controlled substance policy and procedure is on file which allows [facility] to order a urine drug screen test at any visit. Patient understands and agrees with the plan  This patient was seen by HLeretha PolFNP Collaboration with Dr FLavera Guiseas a part of collaborative care agreement  Orders Placed This Encounter  Procedures  . CULTURE, URINE COMPREHENSIVE  . POCT Urinalysis Dipstick    Meds ordered this encounter  Medications  . DISCONTD: phenazopyridine (PYRIDIUM) 200 MG tablet    Sig: Take 1 tablet (200 mg total) by mouth 3 (three) times daily  as needed for pain.    Dispense:  10 tablet    Refill:  0    Order Specific Question:   Supervising Provider    Answer:   KLavera Guise[[[id number]] . cephALEXin (KEFLEX) 500 MG capsule    Sig: Take 1 capsule (500 mg total) by mouth 3 (three) times daily.    Dispense:  30 capsule    Refill:  0    Order Specific Question:   Supervising Provider    Answer:   KLavera Guise[[[id number]] . DISCONTD: HYDROcodone-acetaminophen (NORCO/VICODIN) 5-325 MG tablet    Sig: Take 1 tablet by mouth every 6 (six) hours as needed for moderate pain.    Dispense:  30 tablet    Refill:  0    The patient states that she is able to take hydrocodone without allergic reaction/negative side effects.    Order Specific Question:   Supervising Provider    Answer:   KLavera Guise[[[id number]]   Total time spent: 30 Minutes   Time spent includes review of chart, medications, test results, and follow up plan with the patient.      Dr FLavera GuiseInternal medicine

## 2019-12-11 LAB — CULTURE, URINE COMPREHENSIVE

## 2019-12-12 ENCOUNTER — Telehealth: Payer: Self-pay

## 2019-12-12 ENCOUNTER — Other Ambulatory Visit: Payer: Self-pay

## 2019-12-12 DIAGNOSIS — N390 Urinary tract infection, site not specified: Secondary | ICD-10-CM

## 2019-12-12 DIAGNOSIS — R300 Dysuria: Secondary | ICD-10-CM

## 2019-12-12 MED ORDER — NITROFURANTOIN MONOHYD MACRO 100 MG PO CAPS: 100.0000 mg | ORAL_CAPSULE | Freq: Two times a day (BID) | ORAL | 0 refills | Status: AC

## 2019-12-12 MED ORDER — PHENAZOPYRIDINE HCL 200 MG PO TABS: 200.0000 mg | ORAL_TABLET | Freq: Three times a day (TID) | ORAL | 0 refills | Status: AC | PRN

## 2019-12-12 NOTE — Telephone Encounter (Signed)
Per culture results this is multi-drug resistant strand e.coli. it is susceptible to keflex, but will change to macrobid which is also good. This is twice daily for 10 days. I renewed pyridium as well. This is for bladder spasms though, not abdominal pain.

## 2019-12-12 NOTE — Telephone Encounter (Signed)
Pt.notified

## 2019-12-14 ENCOUNTER — Telehealth: Payer: Self-pay

## 2019-12-14 NOTE — Telephone Encounter (Signed)
Confirmed appointment on 12/15/2019. klh

## 2019-12-15 ENCOUNTER — Ambulatory Visit
Admission: RE | Admit: 2019-12-15 | Discharge: 2019-12-15 | Disposition: A | Discharge status: Home / Self Care | Payer: Medicare HMO | Source: Ambulatory Visit | Attending: Nurse Practitioner | Admitting: Nurse Practitioner

## 2019-12-15 ENCOUNTER — Encounter: Payer: Self-pay | Admitting: Nurse Practitioner

## 2019-12-15 ENCOUNTER — Other Ambulatory Visit: Payer: Self-pay

## 2019-12-15 ENCOUNTER — Ambulatory Visit (INDEPENDENT_AMBULATORY_CARE_PROVIDER_SITE_OTHER): Payer: Medicare HMO | Admitting: Nurse Practitioner

## 2019-12-15 VITALS — BP 124/68 | HR 94 | Temp 98.0°F | Resp 16 | Ht 67.0 in | Wt 133.4 lb

## 2019-12-15 DIAGNOSIS — R140 Abdominal distension (gaseous): Secondary | ICD-10-CM | POA: Insufficient documentation

## 2019-12-15 DIAGNOSIS — R109 Unspecified abdominal pain: Secondary | ICD-10-CM | POA: Diagnosis not present

## 2019-12-15 DIAGNOSIS — R188 Other ascites: Secondary | ICD-10-CM | POA: Diagnosis not present

## 2019-12-15 DIAGNOSIS — R110 Nausea: Secondary | ICD-10-CM | POA: Diagnosis not present

## 2019-12-15 DIAGNOSIS — N281 Cyst of kidney, acquired: Secondary | ICD-10-CM | POA: Diagnosis not present

## 2019-12-15 DIAGNOSIS — R319 Hematuria, unspecified: Secondary | ICD-10-CM

## 2019-12-15 DIAGNOSIS — N390 Urinary tract infection, site not specified: Secondary | ICD-10-CM

## 2019-12-15 DIAGNOSIS — R102 Pelvic and perineal pain: Secondary | ICD-10-CM | POA: Insufficient documentation

## 2019-12-15 MED ORDER — IOHEXOL 300 MG/ML  SOLN
100.0000 mL | Freq: Once | INTRAMUSCULAR | Status: AC | PRN
  Administered 2019-12-15: 100 mL via INTRAVENOUS

## 2019-12-15 MED ORDER — ONDANSETRON 4 MG PO TBDP: 4.0000 mg | ORAL_TABLET | Freq: Three times a day (TID) | ORAL | 0 refills | Status: AC | PRN

## 2019-12-15 MED ORDER — METFORMIN HCL ER 500 MG PO TB24: ORAL_TABLET | ORAL | 1 refills | Status: AC

## 2019-12-15 NOTE — Progress Notes (Signed)
[facility] [place], [place] [address]  Internal MEDICINE  Office Visit Note  Patient Name: Alexandra Yang  [id number]  [id number]  Date of Service: 12/15/2019  Chief Complaint  Patient presents with  . Spasms  . Leg Problem    Left leg swelling  . Nausea  . Urinary Tract Infection    Pain when urinating     The patient is here for sick visit. She is having severe lower abdominal and pelvic pain. She was seen on 12/08/2019 for UTI. Was initially started on cephalexin. After several days, she was still having symptoms. Urine culture was positive for multi-drug resistant e. Coli, however, cephalexin was effective against the bacteria. Since she continued to have symptoms, changed cephalexin to macrobid 16m twice daily. Change was made on 12/12/2019. Since then, she has developed severe abdominal and pelvic pain. She feels as though her abdomen is very swollen and thinks her bladder may be prolapsed. She also has moderate nausea, though no vomiting. She also states that she has swelling in both lower legs, left worse than right.   Pt is here for a sick visit.     Current Medication:  Outpatient Encounter Medications as of 12/15/2019  Medication Sig  . Accu-Chek Softclix Lancets lancets Use as instructed to check BS everyday and PRN.  E11.65  . albuterol (PROVENTIL) (2.5 MG/3ML) 0.083% nebulizer solution Take 2.5 mg by nebulization every 6 (six) hours as needed for wheezing or shortness of breath.  . Alcohol Swabs (B-D SINGLE USE SWABS REGULAR) PADS Use as directed with insulin injections and testing blood sugars three times daily  . aspirin 81 MG chewable tablet Chew 81 mg by mouth every evening.   . Blood Glucose Monitoring Suppl (ACCU-CHEK GUIDE ME) w/Device KIT Use as directed to check BS every day and PRN  . cephALEXin (KEFLEX) 500 MG capsule Take 1 capsule (500 mg total) by mouth 3 (three) times daily.  . cholecalciferol (VITAMIN D) 1000 UNITS tablet  Take 1,000 Units by mouth daily.  .Marland KitchenCRANBERRY PO Take by mouth.  .Marland Kitchenglucose blood (ACCU-CHEK GUIDE) test strip Blood sugars tested QD and prn.  Dx - E11.65  . HYDROcodone-acetaminophen (NORCO/VICODIN) 5-325 MG tablet Take 1 tablet by mouth every 6 (six) hours as needed for moderate pain.  .Marland Kitchenibuprofen (ADVIL) 800 MG tablet Take 1 tablet (800 mg total) by mouth 3 (three) times daily as needed.  . insulin glargine, 2 Unit Dial, (TOUJEO MAX SOLOSTAR) 300 UNIT/ML Solostar Pen Inject 10 Units into the skin at bedtime.  . Insulin Pen Needle 32G X 4 MM MISC USE AS ONCE DAILY  . ipratropium-albuterol (DUONEB) 0.5-2.5 (3) MG/3ML SOLN Use as directed three time a day diag j44.9  . lansoprazole (PREVACID) 30 MG capsule Take 1 capsule (30 mg total) by mouth daily at 12 noon.  .Marland Kitchenlisinopril (ZESTRIL) 20 MG tablet Take 1 tablet (20 mg total) by mouth daily.  . metFORMIN (GLUCOPHAGE-XR) 500 MG 24 hr tablet TAKE 1 TABLET EVERY NIGHT  . Multiple Vitamins-Minerals (CENTRUM SILVER PO) Take 1 tablet by mouth daily.  . nitrofurantoin, macrocrystal-monohydrate, (MACROBID) 100 MG capsule Take 1 capsule (100 mg total) by mouth 2 (two) times daily.  . ondansetron (ZOFRAN ODT) 4 MG disintegrating tablet Take 1 tablet (4 mg total) by mouth every 8 (eight) hours as needed for nausea or vomiting.  . ondansetron (ZOFRAN) 4 MG tablet Take 1 tablet (4 mg total) by mouth every 8 (eight) hours as needed  for nausea or vomiting.  . phenazopyridine (PYRIDIUM) 200 MG tablet Take 1 tablet (200 mg total) by mouth 3 (three) times daily as needed for pain.  . pregabalin (LYRICA) 100 MG capsule Take 1 capsule (100 mg total) by mouth at bedtime as needed (pain).  . Probiotic Product (PROBIOTIC PO) Take by mouth.  . Semaglutide, 1 MG/DOSE, (OZEMPIC, 1 MG/DOSE,) 2 MG/1.5ML SOPN Inject 1 mg into the skin once a week.  . simvastatin (ZOCOR) 40 MG tablet Take 1 tablet (40 mg total) by mouth at bedtime.  . vitamin B-12 (CYANOCOBALAMIN) 1000 MCG  tablet Take 1,000 mcg by mouth daily.   . [DISCONTINUED] ondansetron (ZOFRAN ODT) 4 MG disintegrating tablet Take 1 tablet (4 mg total) by mouth every 8 (eight) hours as needed for nausea or vomiting.   No facility-administered encounter medications on file as of 12/15/2019.      Medical History: Past Medical History:  Diagnosis Date  . Arthritis    osteoarthritis  . Cancer ([place]) 1995   endometrial ca  . Cataract   . COPD (chronic obstructive pulmonary disease) ([place])   . Diabetes mellitus without complication ([place])    type 2  . GERD (gastroesophageal reflux disease)   . History of kidney infection    had to have PICC line  . Neuropathy    both feet  . Osteopenia   . Osteoporosis   . Pneumonia      Today's Vitals   12/15/19 1122  BP: 124/68  Pulse: 94  Resp: 16  Temp: 98 F (36.7 C)  SpO2: 99%  Weight: 133 lb 6.4 oz (60.5 kg)  Height: '5\' 7"'  (1.702 m)   Body mass index is 20.89 kg/m.  Review of Systems  Constitutional: Positive for activity change, appetite change, chills and fatigue. Negative for fever and unexpected weight change.  HENT: Negative for congestion, postnasal drip, rhinorrhea, sneezing and sore throat.   Respiratory: Negative for cough, chest tightness, shortness of breath and wheezing.   Cardiovascular: Positive for leg swelling. Negative for chest pain and palpitations.  Gastrointestinal: Positive for abdominal pain and nausea. Negative for constipation, diarrhea and vomiting.  Genitourinary: Positive for dysuria, flank pain, frequency, pelvic pain and urgency.  Musculoskeletal: Positive for arthralgias. Negative for back pain, joint swelling and neck pain.  Skin: Negative for rash.  Neurological: Negative for dizziness, tremors, numbness and headaches.  Hematological: Negative for adenopathy. Does not bruise/bleed easily.  Psychiatric/Behavioral: Negative for behavioral problems (Depression), sleep disturbance and suicidal ideas. The patient is  nervous/anxious.     Physical Exam Vitals and nursing note reviewed.  Constitutional:      General: She is in acute distress.     Appearance: She is well-developed. She is ill-appearing. She is not diaphoretic.  HENT:     Head: Normocephalic and atraumatic.     Nose: Nose normal.     Mouth/Throat:     Pharynx: No oropharyngeal exudate.  Eyes:     Pupils: Pupils are equal, round, and reactive to light.  Neck:     Thyroid: No thyromegaly.     Vascular: No JVD.     Trachea: No tracheal deviation.  Cardiovascular:     Rate and Rhythm: Normal rate and regular rhythm.     Heart sounds: Normal heart sounds. No murmur heard.  No friction rub. No gallop.      Comments: There is mild, non-pitting edema in left lower leg.  Pulmonary:     Effort: Pulmonary effort is normal. No  respiratory distress.     Breath sounds: Normal breath sounds. No wheezing or rales.  Chest:     Chest wall: No tenderness.  Abdominal:     General: Bowel sounds are normal.     Palpations: Abdomen is soft. There is no mass.     Tenderness: There is abdominal tenderness. There is guarding.  Musculoskeletal:        General: Normal range of motion.     Cervical back: Normal range of motion and neck supple.  Lymphadenopathy:     Cervical: No cervical adenopathy.  Skin:    General: Skin is warm and dry.  Neurological:     Mental Status: She is alert and oriented to person, place, and time.     Cranial Nerves: No cranial nerve deficit.  Psychiatric:        Behavior: Behavior normal.        Thought Content: Thought content normal.        Judgment: Judgment normal.   Assessment/Plan: 1. Combined abdominal and pelvic pain Pain is severe with unclear cause. Will get urgent CT abdomen and pelvis for further evaluation.  - CT Abdomen Pelvis W Contrast; Future  2. Abdominal distension (gaseous) Pain is severe with unclear cause. Will get urgent CT abdomen and pelvis for further evaluation.  - CT Abdomen Pelvis W  Contrast; Future  3. Nausea May take zofran 76m ODT as needed and as prescribed for acute nausea - ondansetron (ZOFRAN ODT) 4 MG disintegrating tablet; Take 1 tablet (4 mg total) by mouth every 8 (eight) hours as needed for nausea or vomiting.  Dispense: 20 tablet; Refill: 0  4. Urinary tract infection with hematuria, site unspecified Resend urine sample for culture and sensitovoty. For now, will change antibiotics back to cephalexin three times daily. Will adjust antibiotic therapy as indicated.  - CULTURE, URINE COMPREHENSIVE  General Counseling: LKileenverbalizes understanding of the findings of todays visit and agrees with plan of treatment. I have discussed any further diagnostic evaluation that may be needed or ordered today. We also reviewed her medications today. she has been encouraged to call the office with any questions or concerns that should arise related to todays visit.    Counseling:  This patient was seen by HLeretha PolFNP Collaboration with Dr FLavera Guiseas a part of collaborative care agreement  Orders Placed This Encounter  Procedures  . CULTURE, URINE COMPREHENSIVE  . CT Abdomen Pelvis W Contrast    Meds ordered this encounter  Medications  . ondansetron (ZOFRAN ODT) 4 MG disintegrating tablet    Sig: Take 1 tablet (4 mg total) by mouth every 8 (eight) hours as needed for nausea or vomiting.    Dispense:  20 tablet    Refill:  0    Order Specific Question:   Supervising Provider    Answer:   KLavera Guise[[[id number]]   Time spent: 30 Minutes

## 2019-12-15 NOTE — Progress Notes (Signed)
Hey. She needs appointment with me and we need to start working on STAT appointment with oncology. It's going to have to be 30 minutes. Can we move someone else in morning and add her. Thanks.

## 2019-12-16 ENCOUNTER — Encounter: Payer: Self-pay | Admitting: Nurse Practitioner

## 2019-12-16 ENCOUNTER — Ambulatory Visit (INDEPENDENT_AMBULATORY_CARE_PROVIDER_SITE_OTHER): Payer: Medicare HMO | Admitting: Nurse Practitioner

## 2019-12-16 VITALS — BP 128/80 | HR 111 | Temp 97.6°F | Resp 16 | Ht 67.0 in | Wt 133.4 lb

## 2019-12-16 DIAGNOSIS — N390 Urinary tract infection, site not specified: Secondary | ICD-10-CM | POA: Diagnosis not present

## 2019-12-16 DIAGNOSIS — C252 Malignant neoplasm of tail of pancreas: Secondary | ICD-10-CM

## 2019-12-16 DIAGNOSIS — R109 Unspecified abdominal pain: Secondary | ICD-10-CM | POA: Diagnosis not present

## 2019-12-16 DIAGNOSIS — R102 Pelvic and perineal pain: Secondary | ICD-10-CM

## 2019-12-16 DIAGNOSIS — R319 Hematuria, unspecified: Secondary | ICD-10-CM | POA: Diagnosis not present

## 2019-12-16 MED ORDER — HYDROCODONE-ACETAMINOPHEN 5-325 MG PO TABS: 1.0000 | ORAL_TABLET | Freq: Four times a day (QID) | ORAL | 0 refills | Status: AC | PRN

## 2019-12-16 NOTE — Progress Notes (Signed)
[facility] [place], [place] [address]  Internal MEDICINE  Office Visit Note  Patient Name: Alexandra Yang  [id number]  [id number]  Date of Service: 12/16/2019  Chief Complaint  Patient presents with  . Follow-up    Reviewing CT results (Pulse ox could not pick up oxygen/pulse bc of nails)    The patient is here for follow up visit. She was seen yesterday due to severe lower abdominal and pelvic pain. She was seen on 12/08/2019 for UTI. Was initially started on cephalexin. After several days, she was still having symptoms. Urine culture was positive for multi-drug resistant e. Coli and cephalexin was effective against the bacteria. Since she continued to have symptoms, changed cephalexin to macrobid 131m twice daily. Change was made on 12/12/2019. Since then, she has developed severe abdominal and pelvic pain. She feels as though her abdomen is very swollen. She was sent for STAT CT of abdomen and pelvis due to the severity of pain she was having. The finding were as follows : 1. 3.9 x 2.5 cm mass is seen in left pancreatic tail consistent with malignancy. 2. Interval development of multiple rounded low densities throughout the liver concerning for metastatic disease. 3. Mild ascites is noted throughout the abdomen. There appears to be omental caking concerning for peritoneal carcinomatosis. After results explained to the patient and the patient's daughter, the patient stated that her mother passed away from pancreatic cancer at age 72        Current Medication: Outpatient Encounter Medications as of 12/16/2019  Medication Sig  . Accu-Chek Softclix Lancets lancets Use as instructed to check BS everyday and PRN.  E11.65  . albuterol (PROVENTIL) (2.5 MG/3ML) 0.083% nebulizer solution Take 2.5 mg by nebulization every 6 (six) hours as needed for wheezing or shortness of breath.  . Alcohol Swabs (B-D SINGLE USE SWABS REGULAR) PADS Use as directed with insulin  injections and testing blood sugars three times daily  . aspirin 81 MG chewable tablet Chew 81 mg by mouth every evening.   . Blood Glucose Monitoring Suppl (ACCU-CHEK GUIDE ME) w/Device KIT Use as directed to check BS every day and PRN  . cephALEXin (KEFLEX) 500 MG capsule Take 1 capsule (500 mg total) by mouth 3 (three) times daily.  . cholecalciferol (VITAMIN D) 1000 UNITS tablet Take 1,000 Units by mouth daily.  .Marland KitchenCRANBERRY PO Take by mouth.  .Marland Kitchenglucose blood (ACCU-CHEK GUIDE) test strip Blood sugars tested QD and prn.  Dx - E11.65  . HYDROcodone-acetaminophen (NORCO/VICODIN) 5-325 MG tablet Take 1 tablet by mouth every 6 (six) hours as needed for moderate pain.  .Marland Kitchenibuprofen (ADVIL) 800 MG tablet Take 1 tablet (800 mg total) by mouth 3 (three) times daily as needed.  . insulin glargine, 2 Unit Dial, (TOUJEO MAX SOLOSTAR) 300 UNIT/ML Solostar Pen Inject 10 Units into the skin at bedtime.  . Insulin Pen Needle 32G X 4 MM MISC USE AS ONCE DAILY  . ipratropium-albuterol (DUONEB) 0.5-2.5 (3) MG/3ML SOLN Use as directed three time a day diag j44.9  . lansoprazole (PREVACID) 30 MG capsule Take 1 capsule (30 mg total) by mouth daily at 12 noon.  .Marland Kitchenlisinopril (ZESTRIL) 20 MG tablet Take 1 tablet (20 mg total) by mouth daily.  . metFORMIN (GLUCOPHAGE-XR) 500 MG 24 hr tablet TAKE 1 TABLET EVERY NIGHT  . Multiple Vitamins-Minerals (CENTRUM SILVER PO) Take 1 tablet by mouth daily.  . nitrofurantoin, macrocrystal-monohydrate, (MACROBID) 100 MG capsule Take 1 capsule (100 mg  total) by mouth 2 (two) times daily.  . ondansetron (ZOFRAN ODT) 4 MG disintegrating tablet Take 1 tablet (4 mg total) by mouth every 8 (eight) hours as needed for nausea or vomiting.  . ondansetron (ZOFRAN) 4 MG tablet Take 1 tablet (4 mg total) by mouth every 8 (eight) hours as needed for nausea or vomiting.  . phenazopyridine (PYRIDIUM) 200 MG tablet Take 1 tablet (200 mg total) by mouth 3 (three) times daily as needed for pain.  .  pregabalin (LYRICA) 100 MG capsule Take 1 capsule (100 mg total) by mouth at bedtime as needed (pain).  . Probiotic Product (PROBIOTIC PO) Take by mouth.  . Semaglutide, 1 MG/DOSE, (OZEMPIC, 1 MG/DOSE,) 2 MG/1.5ML SOPN Inject 1 mg into the skin once a week.  . simvastatin (ZOCOR) 40 MG tablet Take 1 tablet (40 mg total) by mouth at bedtime.  . vitamin B-12 (CYANOCOBALAMIN) 1000 MCG tablet Take 1,000 mcg by mouth daily.   . [DISCONTINUED] HYDROcodone-acetaminophen (NORCO/VICODIN) 5-325 MG tablet Take 1 tablet by mouth every 6 (six) hours as needed for moderate pain.   No facility-administered encounter medications on file as of 12/16/2019.    Surgical History: Past Surgical History:  Procedure Laterality Date  . ABDOMINAL HYSTERECTOMY    . CHOLECYSTECTOMY    . EYE SURGERY    . KNEE ARTHROSCOPY Right   . TENDON TRANSFER Right 01/20/2015   Procedure: RIGHT HAND AND MIDDLE FINGER TENDON RECONSTRUCTION AND TRANSFER;  Surgeon: Roseanne Kaufman, MD;  Location: [place];  Service: Orthopedics;  Laterality: Right;    Medical History: Past Medical History:  Diagnosis Date  . Arthritis    osteoarthritis  . Cancer ([place]) 1995   endometrial ca  . Cataract   . COPD (chronic obstructive pulmonary disease) ([place])   . Diabetes mellitus without complication ([place])    type 2  . GERD (gastroesophageal reflux disease)   . History of kidney infection    had to have PICC line  . Neuropathy    both feet  . Osteopenia   . Osteoporosis   . Pneumonia     Family History: Family History  Problem Relation Age of Onset  . Pancreatic cancer Mother   . Cancer Mother        pancreaitis  . Lung cancer Father   . Breast cancer Sister 40    Social History   Socioeconomic History  . Marital status: Widowed    Spouse name: Not on file  . Number of children: Not on file  . Years of education: Not on file  . Highest education level: Not on file  Occupational History  . Not on file  Tobacco Use  . Smoking  status: Current Every Day Smoker    Packs/day: 0.50    Years: 40.00    Pack years: 20.00    Types: Cigarettes  . Smokeless tobacco: Never Used  Substance and Sexual Activity  . Alcohol use: Yes    Comment: occasional  . Drug use: No  . Sexual activity: Not on file  Other Topics Concern  . Not on file  Social History Narrative  . Not on file   Social Determinants of Health   Financial Resource Strain:   . Difficulty of Paying Living Expenses:   Food Insecurity:   . Worried About Charity fundraiser in the Last Year:   . Arboriculturist in the Last Year:   Transportation Needs:   . Film/video editor (Medical):   Marland Kitchen  Lack of Transportation (Non-Medical):   Physical Activity:   . Days of Exercise per Week:   . Minutes of Exercise per Session:   Stress:   . Feeling of Stress :   Social Connections:   . Frequency of Communication with Friends and Family:   . Frequency of Social Gatherings with Friends and Family:   . Attends Religious Services:   . Active Member of Clubs or Organizations:   . Attends Archivist Meetings:   Marland Kitchen Marital Status:   Intimate Partner Violence:   . Fear of Current or Ex-Partner:   . Emotionally Abused:   Marland Kitchen Physically Abused:   . Sexually Abused:       Review of Systems  Constitutional: Positive for activity change, appetite change, chills, diaphoresis and fatigue. Negative for fever and unexpected weight change.       Patient states that she developed sweats last night.   HENT: Negative for congestion, postnasal drip, rhinorrhea, sneezing and sore throat.   Respiratory: Negative for cough, chest tightness, shortness of breath and wheezing.   Cardiovascular: Positive for leg swelling. Negative for chest pain and palpitations.  Gastrointestinal: Positive for abdominal pain and nausea. Negative for constipation, diarrhea and vomiting.  Genitourinary: Positive for dysuria, flank pain, frequency, pelvic pain and urgency.   Musculoskeletal: Positive for arthralgias. Negative for back pain, joint swelling and neck pain.  Skin: Negative for rash.  Neurological: Negative for dizziness, tremors, numbness and headaches.  Hematological: Negative for adenopathy. Does not bruise/bleed easily.  Psychiatric/Behavioral: Negative for behavioral problems (Depression), sleep disturbance and suicidal ideas. The patient is nervous/anxious.    Today's Vitals   12/16/19 0930  BP: 128/80  Pulse: (!) 111  Resp: 16  Temp: 97.6 F (36.4 C)  SpO2: 96%  Weight: 133 lb 6.4 oz (60.5 kg)  Height: '5\' 7"'  (1.702 m)   Body mass index is 20.89 kg/m.  Physical Exam Vitals and nursing note reviewed.  Constitutional:      General: She is not in acute distress.    Appearance: Normal appearance. She is well-developed. She is not ill-appearing or diaphoretic.  HENT:     Head: Normocephalic and atraumatic.     Nose: Nose normal.     Mouth/Throat:     Pharynx: No oropharyngeal exudate.  Eyes:     Pupils: Pupils are equal, round, and reactive to light.  Neck:     Thyroid: No thyromegaly.     Vascular: No JVD.     Trachea: No tracheal deviation.  Cardiovascular:     Rate and Rhythm: Normal rate and regular rhythm.     Heart sounds: Normal heart sounds. No murmur heard.  No friction rub. No gallop.      Comments: There is mild, non-pitting edema in left lower leg.  Pulmonary:     Effort: Pulmonary effort is normal. No respiratory distress.     Breath sounds: Normal breath sounds. No wheezing or rales.  Chest:     Chest wall: No tenderness.  Abdominal:     General: Bowel sounds are normal.     Palpations: Abdomen is soft. There is no mass.     Tenderness: There is abdominal tenderness. There is guarding.  Musculoskeletal:        General: Normal range of motion.     Cervical back: Normal range of motion and neck supple.  Lymphadenopathy:     Cervical: No cervical adenopathy.  Skin:    General: Skin is warm and dry.  Neurological:     Mental Status: She is alert and oriented to person, place, and time.     Cranial Nerves: No cranial nerve deficit.  Psychiatric:        Attention and Perception: Attention and perception normal.        Mood and Affect: Affect is tearful.        Speech: Speech normal.        Behavior: Behavior normal.        Thought Content: Thought content normal.        Cognition and Memory: Cognition and memory normal.        Judgment: Judgment normal.    Assessment/Plan: 1. Malignant neoplasm of tail of pancreas ([place]) Reviewed results of STAT CT of abdomen and pelvis done 12/15/2019. There is 1. 3.9 x 2.5 cm mass is seen in left pancreatic tail consistent with malignancy. 2. Interval development of multiple rounded low densities throughout the liver concerning for metastatic disease. 3. Mild ascites is noted throughout the abdomen. There appears to be omental caking concerning for peritoneal carcinomatosis.  An urgent referral to oncology was made for further evaluation and treatment  - Ambulatory referral to Oncology  2. Combined abdominal and pelvic pain May take hydrocodone/APAP 5/352m tablets as needed and as prescribed for acute pain. A new prescription was sent to her pharmacy today. Reviewed risks and possible side effects associated with taking opiates, benzodiazepines and other CNS depressants. Combination of these could cause dizziness and drowsiness. Advised patient not to drive or operate machinery when taking these medications, as patient's and other's life can be at risk and will have consequences. Patient verbalized understanding in this matter. Dependence and abuse for these drugs will be monitored closely. A Controlled substance policy and procedure is on file which allows [facility] to order a urine drug screen test at any visit. Patient understands and agrees with the plan - HYDROcodone-acetaminophen (NORCO/VICODIN) 5-325 MG tablet; Take 1 tablet by mouth  every 6 (six) hours as needed for moderate pain.  Dispense: 30 tablet; Refill: 0  3. Urinary tract infection with hematuria, site unspecified Patient should continue cephalexin as previously prescribed for UTI.   General Counseling: Llinnea todiscounderstanding of the findings of todays visit and agrees with plan of treatment. I have discussed any further diagnostic evaluation that may be needed or ordered today. We also reviewed her medications today. she has been encouraged to call the office with any questions or concerns that should arise related to todays visit.  This patient was seen by HLeretha PolFNP Collaboration with Dr FLavera Guiseas a part of collaborative care agreement  Orders Placed This Encounter  Procedures  . Ambulatory referral to Oncology    Meds ordered this encounter  Medications  . HYDROcodone-acetaminophen (NORCO/VICODIN) 5-325 MG tablet    Sig: Take 1 tablet by mouth every 6 (six) hours as needed for moderate pain.    Dispense:  30 tablet    Refill:  0    The patient states that she is able to take hydrocodone without allergic reaction/negative side effects.    Order Specific Question:   Supervising Provider    Answer:   KLavera Guise[[[id number]   Total time spent: 30 Minutes   Time spent includes review of chart, medications, test results, and follow up plan with the patient.      Dr FLavera GuiseInternal medicine

## 2019-12-19 ENCOUNTER — Other Ambulatory Visit: Payer: Self-pay

## 2019-12-19 ENCOUNTER — Emergency Department: Payer: Medicare Other

## 2019-12-19 ENCOUNTER — Inpatient Hospital Stay
Admission: EM | Admit: 2019-12-19 | Discharge: 2020-01-18 | DRG: 871 | Disposition: E | Payer: Medicare Other | Attending: Internal Medicine | Admitting: Internal Medicine

## 2019-12-19 ENCOUNTER — Encounter: Payer: Self-pay | Admitting: Emergency Medicine

## 2019-12-19 DIAGNOSIS — R61 Generalized hyperhidrosis: Secondary | ICD-10-CM | POA: Diagnosis not present

## 2019-12-19 DIAGNOSIS — J9811 Atelectasis: Secondary | ICD-10-CM | POA: Diagnosis not present

## 2019-12-19 DIAGNOSIS — Z20822 Contact with and (suspected) exposure to covid-19: Secondary | ICD-10-CM | POA: Diagnosis not present

## 2019-12-19 DIAGNOSIS — Z800 Family history of malignant neoplasm of digestive organs: Secondary | ICD-10-CM | POA: Diagnosis not present

## 2019-12-19 DIAGNOSIS — F17210 Nicotine dependence, cigarettes, uncomplicated: Secondary | ICD-10-CM | POA: Diagnosis present

## 2019-12-19 DIAGNOSIS — A419 Sepsis, unspecified organism: Secondary | ICD-10-CM | POA: Diagnosis not present

## 2019-12-19 DIAGNOSIS — M1990 Unspecified osteoarthritis, unspecified site: Secondary | ICD-10-CM | POA: Diagnosis not present

## 2019-12-19 DIAGNOSIS — R0902 Hypoxemia: Secondary | ICD-10-CM | POA: Diagnosis not present

## 2019-12-19 DIAGNOSIS — E1165 Type 2 diabetes mellitus with hyperglycemia: Secondary | ICD-10-CM | POA: Diagnosis not present

## 2019-12-19 DIAGNOSIS — N390 Urinary tract infection, site not specified: Secondary | ICD-10-CM | POA: Diagnosis not present

## 2019-12-19 DIAGNOSIS — E861 Hypovolemia: Secondary | ICD-10-CM | POA: Diagnosis not present

## 2019-12-19 DIAGNOSIS — K219 Gastro-esophageal reflux disease without esophagitis: Secondary | ICD-10-CM | POA: Diagnosis present

## 2019-12-19 DIAGNOSIS — C259 Malignant neoplasm of pancreas, unspecified: Secondary | ICD-10-CM | POA: Diagnosis not present

## 2019-12-19 DIAGNOSIS — Z8542 Personal history of malignant neoplasm of other parts of uterus: Secondary | ICD-10-CM

## 2019-12-19 DIAGNOSIS — N179 Acute kidney failure, unspecified: Secondary | ICD-10-CM | POA: Diagnosis present

## 2019-12-19 DIAGNOSIS — Z7982 Long term (current) use of aspirin: Secondary | ICD-10-CM | POA: Diagnosis not present

## 2019-12-19 DIAGNOSIS — E872 Acidosis: Secondary | ICD-10-CM | POA: Diagnosis present

## 2019-12-19 DIAGNOSIS — Z66 Do not resuscitate: Secondary | ICD-10-CM | POA: Diagnosis present

## 2019-12-19 DIAGNOSIS — J90 Pleural effusion, not elsewhere classified: Secondary | ICD-10-CM | POA: Diagnosis not present

## 2019-12-19 DIAGNOSIS — R531 Weakness: Secondary | ICD-10-CM | POA: Diagnosis not present

## 2019-12-19 DIAGNOSIS — C787 Secondary malignant neoplasm of liver and intrahepatic bile duct: Secondary | ICD-10-CM | POA: Diagnosis present

## 2019-12-19 DIAGNOSIS — R42 Dizziness and giddiness: Secondary | ICD-10-CM | POA: Diagnosis not present

## 2019-12-19 DIAGNOSIS — R52 Pain, unspecified: Secondary | ICD-10-CM | POA: Diagnosis not present

## 2019-12-19 DIAGNOSIS — E1142 Type 2 diabetes mellitus with diabetic polyneuropathy: Secondary | ICD-10-CM | POA: Diagnosis present

## 2019-12-19 DIAGNOSIS — R112 Nausea with vomiting, unspecified: Secondary | ICD-10-CM | POA: Diagnosis not present

## 2019-12-19 DIAGNOSIS — Z1624 Resistance to multiple antibiotics: Secondary | ICD-10-CM | POA: Diagnosis not present

## 2019-12-19 DIAGNOSIS — J449 Chronic obstructive pulmonary disease, unspecified: Secondary | ICD-10-CM | POA: Diagnosis not present

## 2019-12-19 DIAGNOSIS — R6521 Severe sepsis with septic shock: Secondary | ICD-10-CM | POA: Diagnosis not present

## 2019-12-19 DIAGNOSIS — A4151 Sepsis due to Escherichia coli [E. coli]: Secondary | ICD-10-CM | POA: Diagnosis not present

## 2019-12-19 DIAGNOSIS — R231 Pallor: Secondary | ICD-10-CM | POA: Diagnosis not present

## 2019-12-19 DIAGNOSIS — E1136 Type 2 diabetes mellitus with diabetic cataract: Secondary | ICD-10-CM | POA: Diagnosis present

## 2019-12-19 DIAGNOSIS — Z794 Long term (current) use of insulin: Secondary | ICD-10-CM | POA: Diagnosis not present

## 2019-12-19 DIAGNOSIS — Z803 Family history of malignant neoplasm of breast: Secondary | ICD-10-CM

## 2019-12-19 DIAGNOSIS — M810 Age-related osteoporosis without current pathological fracture: Secondary | ICD-10-CM | POA: Diagnosis present

## 2019-12-19 DIAGNOSIS — I9589 Other hypotension: Secondary | ICD-10-CM

## 2019-12-19 DIAGNOSIS — R571 Hypovolemic shock: Secondary | ICD-10-CM | POA: Diagnosis present

## 2019-12-19 DIAGNOSIS — Z801 Family history of malignant neoplasm of trachea, bronchus and lung: Secondary | ICD-10-CM

## 2019-12-19 DIAGNOSIS — Z888 Allergy status to other drugs, medicaments and biological substances status: Secondary | ICD-10-CM

## 2019-12-19 DIAGNOSIS — K8689 Other specified diseases of pancreas: Secondary | ICD-10-CM

## 2019-12-19 DIAGNOSIS — C786 Secondary malignant neoplasm of retroperitoneum and peritoneum: Secondary | ICD-10-CM | POA: Diagnosis present

## 2019-12-19 DIAGNOSIS — E860 Dehydration: Secondary | ICD-10-CM | POA: Diagnosis not present

## 2019-12-19 DIAGNOSIS — D630 Anemia in neoplastic disease: Secondary | ICD-10-CM | POA: Diagnosis present

## 2019-12-19 DIAGNOSIS — Z882 Allergy status to sulfonamides status: Secondary | ICD-10-CM

## 2019-12-19 DIAGNOSIS — Z885 Allergy status to narcotic agent status: Secondary | ICD-10-CM

## 2019-12-19 LAB — COMPREHENSIVE METABOLIC PANEL
ALT: 45 U/L — ABNORMAL HIGH (ref 0–44)
AST: 55 U/L — ABNORMAL HIGH (ref 15–41)
Albumin: 3 g/dL — ABNORMAL LOW (ref 3.5–5.0)
Alkaline Phosphatase: 128 U/L — ABNORMAL HIGH (ref 38–126)
Anion gap: 11 (ref 5–15)
BUN: 41 mg/dL — ABNORMAL HIGH (ref 8–23)
CO2: 20 mmol/L — ABNORMAL LOW (ref 22–32)
Calcium: 8.5 mg/dL — ABNORMAL LOW (ref 8.9–10.3)
Chloride: 107 mmol/L (ref 98–111)
Creatinine, Ser: 1.05 mg/dL — ABNORMAL HIGH (ref 0.44–1.00)
GFR calc Af Amer: >60 mL/min (ref >60)
GFR calc non Af Amer: 53 mL/min — ABNORMAL LOW (ref >60)
Glucose, Bld: 325 mg/dL — ABNORMAL HIGH (ref 70–99)
Potassium: 5.1 mmol/L (ref 3.5–5.1)
Sodium: 138 mmol/L (ref 135–145)
Total Bilirubin: 1.1 mg/dL (ref 0.3–1.2)
Total Protein: 6.6 g/dL (ref 6.5–8.1)

## 2019-12-19 LAB — LACTIC ACID, PLASMA
Lactic Acid, Venous: 3.3 mmol/L (ref 0.5–1.9)
Lactic Acid, Venous: 3.2 mmol/L (ref 0.5–1.9)

## 2019-12-19 LAB — CBC WITH DIFFERENTIAL/PLATELET
Abs Immature Granulocytes: 0.13 10*3/uL — ABNORMAL HIGH (ref 0.00–0.07)
Basophils Absolute: 0 10*3/uL (ref 0.0–0.1)
Basophils Relative: 0 %
Eosinophils Absolute: 0 10*3/uL (ref 0.0–0.5)
Eosinophils Relative: 0 %
HCT: 34.3 % — ABNORMAL LOW (ref 36.0–46.0)
Hemoglobin: 11.3 g/dL — ABNORMAL LOW (ref 12.0–15.0)
Immature Granulocytes: 1 %
Lymphocytes Relative: 6 %
Lymphs Abs: 1 10*3/uL (ref 0.7–4.0)
MCH: 31.3 pg (ref 26.0–34.0)
MCHC: 32.9 g/dL (ref 30.0–36.0)
MCV: 95 fL (ref 80.0–100.0)
Monocytes Absolute: 0.8 10*3/uL (ref 0.1–1.0)
Monocytes Relative: 5 %
Neutro Abs: 15.3 10*3/uL — ABNORMAL HIGH (ref 1.7–7.7)
Neutrophils Relative %: 88 %
Platelets: 156 10*3/uL (ref 150–400)
RBC: 3.61 MIL/uL — ABNORMAL LOW (ref 3.87–5.11)
RDW: 13 % (ref 11.5–15.5)
WBC: 17.2 10*3/uL — ABNORMAL HIGH (ref 4.0–10.5)
nRBC: 0 % (ref 0.0–0.2)

## 2019-12-19 LAB — PROCALCITONIN: Procalcitonin: 0.13 ng/mL

## 2019-12-19 LAB — URINALYSIS, COMPLETE (UACMP) WITH MICROSCOPIC
Bilirubin Urine: NEGATIVE
Glucose, UA: >=500 mg/dL — AB
Hgb urine dipstick: NEGATIVE
Ketones, ur: 5 mg/dL — AB
Nitrite: POSITIVE — AB
Protein, ur: 30 mg/dL — AB
Specific Gravity, Urine: 1.024 (ref 1.005–1.030)
WBC, UA: >50 WBC/hpf — ABNORMAL HIGH (ref 0–5)
pH: 5 (ref 5.0–8.0)

## 2019-12-19 LAB — SARS CORONAVIRUS 2 BY RT PCR (HOSPITAL ORDER, PERFORMED IN ~~LOC~~ HOSPITAL LAB): SARS Coronavirus 2: NEGATIVE

## 2019-12-19 LAB — MAGNESIUM: Magnesium: 2.1 mg/dL (ref 1.7–2.4)

## 2019-12-19 LAB — CULTURE, URINE COMPREHENSIVE

## 2019-12-19 LAB — LIPASE, BLOOD: Lipase: 20 U/L (ref 11–51)

## 2019-12-19 LAB — GLUCOSE, CAPILLARY: Glucose-Capillary: 292 mg/dL — ABNORMAL HIGH (ref 70–99)

## 2019-12-19 MED ORDER — HYDROMORPHONE HCL 1 MG/ML PO LIQD: 1.0000 mg | ORAL | Status: DC | PRN

## 2019-12-19 MED ORDER — LACTATED RINGERS IV BOLUS (SEPSIS)
1000.0000 mL | Freq: Once | INTRAVENOUS | Status: AC
  Administered 2019-12-19: 1000 mL via INTRAVENOUS

## 2019-12-19 MED ORDER — PANTOPRAZOLE SODIUM 40 MG PO TBEC
40.0000 mg | DELAYED_RELEASE_TABLET | Freq: Every day | ORAL | Status: DC
  Filled 2019-12-19: qty 1

## 2019-12-19 MED ORDER — ONDANSETRON HCL 4 MG/2ML IJ SOLN
4.0000 mg | Freq: Once | INTRAMUSCULAR | Status: AC
  Administered 2019-12-19: 4 mg via INTRAVENOUS
  Filled 2019-12-19: qty 2

## 2019-12-19 MED ORDER — ALBUTEROL SULFATE (2.5 MG/3ML) 0.083% IN NEBU: 2.5000 mg | INHALATION_SOLUTION | RESPIRATORY_TRACT | Status: DC | PRN

## 2019-12-19 MED ORDER — SODIUM CHLORIDE 0.9 % IV SOLN
2.0000 g | Freq: Two times a day (BID) | INTRAVENOUS | Status: DC
  Filled 2019-12-19: qty 2

## 2019-12-19 MED ORDER — SODIUM CHLORIDE 0.9 % IV SOLN
2.0000 g | Freq: Once | INTRAVENOUS | Status: AC
  Administered 2019-12-19: 2 g via INTRAVENOUS
  Filled 2019-12-19: qty 2

## 2019-12-19 MED ORDER — LACTATED RINGERS IV BOLUS
1000.0000 mL | Freq: Once | INTRAVENOUS | Status: AC
  Administered 2019-12-19: 1000 mL via INTRAVENOUS

## 2019-12-19 MED ORDER — PHENAZOPYRIDINE HCL 200 MG PO TABS
200.0000 mg | ORAL_TABLET | Freq: Three times a day (TID) | ORAL | Status: DC | PRN
  Filled 2019-12-19: qty 1

## 2019-12-19 MED ORDER — ONDANSETRON HCL 4 MG/2ML IJ SOLN
4.0000 mg | Freq: Four times a day (QID) | INTRAMUSCULAR | Status: DC | PRN
  Administered 2019-12-19: 4 mg via INTRAVENOUS
  Filled 2019-12-19 (×2): qty 2

## 2019-12-19 MED ORDER — MORPHINE SULFATE (PF) 4 MG/ML IV SOLN
INTRAVENOUS | Status: AC
  Administered 2019-12-19: 4 mg via INTRAVENOUS
  Filled 2019-12-19: qty 1

## 2019-12-19 MED ORDER — INSULIN ASPART 100 UNIT/ML ~~LOC~~ SOLN
0.0000 [IU] | Freq: Three times a day (TID) | SUBCUTANEOUS | Status: DC
  Administered 2019-12-19: 5 [IU] via SUBCUTANEOUS
  Filled 2019-12-19: qty 1

## 2019-12-19 MED ORDER — KETOROLAC TROMETHAMINE 15 MG/ML IJ SOLN: 15.0000 mg | Freq: Four times a day (QID) | INTRAMUSCULAR | Status: DC

## 2019-12-19 MED ORDER — HYDROMORPHONE HCL 1 MG/ML IJ SOLN: 1.0000 mg | INTRAMUSCULAR | Status: DC | PRN

## 2019-12-19 MED ORDER — FOLIC ACID 1 MG PO TABS
1.0000 mg | ORAL_TABLET | Freq: Every day | ORAL | Status: DC
  Filled 2019-12-19: qty 1

## 2019-12-19 MED ORDER — MORPHINE SULFATE (PF) 4 MG/ML IV SOLN
4.0000 mg | Freq: Once | INTRAVENOUS | Status: AC
  Filled 2019-12-19: qty 1

## 2019-12-19 MED ORDER — PROMETHAZINE HCL 25 MG/ML IJ SOLN: 12.5000 mg | INTRAMUSCULAR | Status: DC | PRN

## 2019-12-19 MED ORDER — ATROPINE SULFATE 1 MG/10ML IJ SOSY: 1.0000 mg | PREFILLED_SYRINGE | Freq: Once | INTRAMUSCULAR | Status: AC

## 2019-12-19 MED ORDER — ATROPINE SULFATE 1 MG/10ML IJ SOSY
PREFILLED_SYRINGE | INTRAMUSCULAR | Status: AC
  Administered 2019-12-19: 1 mg via INTRAVENOUS
  Filled 2019-12-19: qty 10

## 2019-12-19 MED ORDER — OXYCODONE HCL 5 MG PO TABS: 5.0000 mg | ORAL_TABLET | ORAL | Status: DC | PRN

## 2019-12-19 MED ORDER — HYDROCORTISONE NA SUCCINATE PF 100 MG IJ SOLR
50.0000 mg | Freq: Four times a day (QID) | INTRAMUSCULAR | Status: DC
  Filled 2019-12-19 (×3): qty 1

## 2019-12-19 MED ORDER — GABAPENTIN 300 MG PO CAPS
300.0000 mg | ORAL_CAPSULE | Freq: Three times a day (TID) | ORAL | Status: DC
  Filled 2019-12-19: qty 1

## 2019-12-19 MED ORDER — ADULT MULTIVITAMIN W/MINERALS CH
1.0000 | ORAL_TABLET | Freq: Every day | ORAL | Status: DC
  Filled 2019-12-19: qty 1

## 2019-12-19 MED ORDER — INSULIN ASPART 100 UNIT/ML ~~LOC~~ SOLN: 0.0000 [IU] | Freq: Every day | SUBCUTANEOUS | Status: DC

## 2019-12-19 MED ORDER — LACTATED RINGERS IV SOLN: INTRAVENOUS | Status: DC

## 2019-12-19 MED ORDER — SENNOSIDES-DOCUSATE SODIUM 8.6-50 MG PO TABS: 1.0000 | ORAL_TABLET | Freq: Every evening | ORAL | Status: DC | PRN

## 2019-12-19 MED ORDER — ONDANSETRON HCL 4 MG/2ML IJ SOLN: 4.0000 mg | Freq: Once | INTRAMUSCULAR | Status: DC

## 2019-12-19 MED ORDER — ONDANSETRON HCL 4 MG PO TABS: 4.0000 mg | ORAL_TABLET | Freq: Four times a day (QID) | ORAL | Status: DC | PRN

## 2019-12-19 MED ORDER — ASPIRIN 81 MG PO CHEW
81.0000 mg | CHEWABLE_TABLET | Freq: Every evening | ORAL | Status: DC
  Filled 2019-12-19: qty 1

## 2019-12-19 MED ORDER — HYDROCODONE-ACETAMINOPHEN 5-325 MG PO TABS: 1.0000 | ORAL_TABLET | Freq: Four times a day (QID) | ORAL | Status: DC | PRN

## 2019-12-19 MED ORDER — ENOXAPARIN SODIUM 40 MG/0.4ML ~~LOC~~ SOLN: 40.0000 mg | SUBCUTANEOUS | Status: DC

## 2019-12-19 MED ORDER — TRAZODONE HCL 50 MG PO TABS: 25.0000 mg | ORAL_TABLET | Freq: Every evening | ORAL | Status: DC | PRN

## 2019-12-19 MED ORDER — PREGABALIN 50 MG PO CAPS: 100.0000 mg | ORAL_CAPSULE | Freq: Every evening | ORAL | Status: DC | PRN

## 2019-12-24 LAB — CULTURE, BLOOD (ROUTINE X 2)
Culture: NO GROWTH
Culture: NO GROWTH
Special Requests: ADEQUATE

## 2020-01-18 NOTE — Care Management (Signed)
Received call from bedside RN after patient had been transferred to the floor of hypotension associated tachycardia.  This despite aggressive IV fluid bolus resuscitation.  Will attempt additional IV fluid bolus and aggressive maintenance fluids.  We will also initiate stress dose steroids, Solu-Cortef 50 mg IV every 6 hours.  Suspect patient's hypotension may be due to severe pain and narcotic administration in the setting of pancreatic mass with associated hepatic lesions concerning for metastatic deposits.  Patient is a DNR status however is agreeable to aggressive care up until that point.  Should patient further decompensate likely need transfer to the intensive care unit and intensivist consult for is a pressor support.  I have consulted oncology for further recommendations.  Ralene Muskrat MD

## 2020-01-18 NOTE — Progress Notes (Signed)
CODE SEPSIS - PHARMACY COMMUNICATION  **Broad Spectrum Antibiotics should be administered within 1 hour of Sepsis diagnosis**  Time Code Sepsis Called/Page Received: 1402  Antibiotics Ordered: Cefepime  Time of 1st antibiotic administration: 1516  Additional action taken by pharmacy: Spoke with RN at 1450 about administering antibiotics   Benita Gutter 01-11-20  2:23 PM

## 2020-01-18 NOTE — H&P (Signed)
History and Physical    Alexandra Yang FTD:[id number] DOB: 14-Jan-1946 DOA: 2019/12/27  PCP: Lavera Guise, MD Patient coming from: Home  I have personally briefly reviewed patient's old medical records in [place]  Chief Complaint: Abdominal pain, nausea, inability to tolerate p.o.  HPI: Alexandra Yang is a 74 y.o. female with medical history significant of arthritis, COPD, history of endometrial carcinoma, GERD, diabetes presents the ED for evaluation of severe abdominal pain.  Patient had a recently diagnosed pancreatic tail mass 3.9 x 2.5 cm consistent with malignancy.  Also evidence in the liver of metastatic deposits.  Also evidence of omental caking concerning for peritoneal carcinomatosis.  The patient is a family history of pancreatic cancer with a mother dying of other disease at age 71.  She was referred to oncology however could not make it there presented to the emergency department given worsening abdominal pain, inability to tolerate anything by mouth.  On initial evaluation the emergency department patient is multiple metabolic derangements as well as leukocytosis up to 17.  She is afebrile.  She looks very uncomfortable and severe abdominal pain.  Vital signs overall reassuring at this time.  Patient is maintaining her blood pressure.  She is mildly tachycardic.  She complains of severe abdominal pain and distention.  She has a history of resistant pathogens and urinary tract and was recently treated with outpatient antibiotics for multidrug-resistant E. coli.  Cephalexin was prescribed which was effective.  Patient also was attempted Macrobid that she had persistent symptoms.  ED Course: Initial vitals and labs are taken.  Multiple metabolic abnormalities.  Patient received a dose of Zofran, 1 L of IV fluids, one 4 mg dose of morphine and hospitalist was called for admission  Review of Systems: As per HPI otherwise 14 point review of systems negative.    Past Medical  History:  Diagnosis Date  . Arthritis    osteoarthritis  . Cancer ([place]) 1995   endometrial ca  . Cataract   . COPD (chronic obstructive pulmonary disease) ([place])   . Diabetes mellitus without complication ([place])    type 2  . GERD (gastroesophageal reflux disease)   . History of kidney infection    had to have PICC line  . Neuropathy    both feet  . Osteopenia   . Osteoporosis   . Pneumonia     Past Surgical History:  Procedure Laterality Date  . ABDOMINAL HYSTERECTOMY    . CHOLECYSTECTOMY    . EYE SURGERY    . KNEE ARTHROSCOPY Right   . TENDON TRANSFER Right 01/20/2015   Procedure: RIGHT HAND AND MIDDLE FINGER TENDON RECONSTRUCTION AND TRANSFER;  Surgeon: Roseanne Kaufman, MD;  Location: [facility];  Service: Orthopedics;  Laterality: Right;     reports that she has been smoking cigarettes. She has a 20.00 pack-year smoking history. She has never used smokeless tobacco. She reports current alcohol use. She reports that she does not use drugs.  Allergies  Allergen Reactions  . Sulfa Antibiotics Hives  . Hydrocodone-Guaifenesin Other (See Comments)    Hallucinations  . Oxycodone Hcl   . Percodan [Oxycodone-Aspirin] Other (See Comments)    hallucinations  . Ciprofloxacin Rash and Other (See Comments)    Feels like body is on fire  . Naproxen Rash  . Ultram [Tramadol] Rash    Family History  Problem Relation Age of Onset  . Pancreatic cancer Mother   . Cancer Mother        pancreaitis  .  Lung cancer Father   . Breast cancer Sister 55   Significant family history of pancreatic cancer in the mother  Prior to Admission medications   Medication Sig Start Date End Date Taking? Authorizing Provider  Accu-Chek Softclix Lancets lancets Use as instructed to check BS everyday and PRN.  E11.65 12/02/19   Ronnell Freshwater, NP  albuterol (PROVENTIL) (2.5 MG/3ML) 0.083% nebulizer solution Take 2.5 mg by nebulization every 6 (six) hours as needed for wheezing or shortness of breath.     [provider]  Alcohol Swabs (B-D SINGLE USE SWABS REGULAR) PADS Use as directed with insulin injections and testing blood sugars three times daily 12/06/19   Ronnell Freshwater, NP  aspirin 81 MG chewable tablet Chew 81 mg by mouth every evening.     [provider]  Blood Glucose Monitoring Suppl (ACCU-CHEK GUIDE ME) w/Device KIT Use as directed to check BS every day and PRN 12/02/19   Ronnell Freshwater, NP  cephALEXin (KEFLEX) 500 MG capsule Take 1 capsule (500 mg total) by mouth 3 (three) times daily. 12/08/19   Ronnell Freshwater, NP  cholecalciferol (VITAMIN D) 1000 UNITS tablet Take 1,000 Units by mouth daily.    [provider]  CRANBERRY PO Take by mouth.    [provider]  glucose blood (ACCU-CHEK GUIDE) test strip Blood sugars tested QD and prn.  Dx - E11.65 12/02/19   Ronnell Freshwater, NP  HYDROcodone-acetaminophen (NORCO/VICODIN) 5-325 MG tablet Take 1 tablet by mouth every 6 (six) hours as needed for moderate pain. 12/16/19   Ronnell Freshwater, NP  ibuprofen (ADVIL) 800 MG tablet Take 1 tablet (800 mg total) by mouth 3 (three) times daily as needed. 12/06/19   Ronnell Freshwater, NP  insulin glargine, 2 Unit Dial, (TOUJEO MAX SOLOSTAR) 300 UNIT/ML Solostar Pen Inject 10 Units into the skin at bedtime. 10/03/19   Ronnell Freshwater, NP  Insulin Pen Needle 32G X 4 MM MISC USE AS ONCE DAILY 08/12/17   Lavera Guise, MD  ipratropium-albuterol (DUONEB) 0.5-2.5 (3) MG/3ML SOLN Use as directed three time a day diag j44.9 08/03/18   Lavera Guise, MD  lansoprazole (PREVACID) 30 MG capsule Take 1 capsule (30 mg total) by mouth daily at 12 noon. 11/02/19   Ronnell Freshwater, NP  lisinopril (ZESTRIL) 20 MG tablet Take 1 tablet (20 mg total) by mouth daily. 12/06/19   Ronnell Freshwater, NP  metFORMIN (GLUCOPHAGE-XR) 500 MG 24 hr tablet TAKE 1 TABLET EVERY NIGHT 12/15/19   Ronnell Freshwater, NP  Multiple Vitamins-Minerals (CENTRUM SILVER PO) Take 1 tablet by mouth daily.     [provider]  nitrofurantoin, macrocrystal-monohydrate, (MACROBID) 100 MG capsule Take 1 capsule (100 mg total) by mouth 2 (two) times daily. 12/12/19   Ronnell Freshwater, NP  ondansetron (ZOFRAN ODT) 4 MG disintegrating tablet Take 1 tablet (4 mg total) by mouth every 8 (eight) hours as needed for nausea or vomiting. 12/15/19   Ronnell Freshwater, NP  ondansetron (ZOFRAN) 4 MG tablet Take 1 tablet (4 mg total) by mouth every 8 (eight) hours as needed for nausea or vomiting. 07/26/18   Ronnell Freshwater, NP  phenazopyridine (PYRIDIUM) 200 MG tablet Take 1 tablet (200 mg total) by mouth 3 (three) times daily as needed for pain. 12/12/19   Ronnell Freshwater, NP  pregabalin (LYRICA) 100 MG capsule Take 1 capsule (100 mg total) by mouth at bedtime as needed (pain). 11/10/19  Ronnell Freshwater, NP  Probiotic Product (PROBIOTIC PO) Take by mouth.    [provider]  Semaglutide, 1 MG/DOSE, (OZEMPIC, 1 MG/DOSE,) 2 MG/1.5ML SOPN Inject 1 mg into the skin once a week. 08/09/19   Ronnell Freshwater, NP  simvastatin (ZOCOR) 40 MG tablet Take 1 tablet (40 mg total) by mouth at bedtime. 12/06/19   Ronnell Freshwater, NP  vitamin B-12 (CYANOCOBALAMIN) 1000 MCG tablet Take 1,000 mcg by mouth daily.     [provider]    Physical Exam: Vitals:   2020-01-18 1203 01/18/20 1206 Jan 18, 2020 1228  BP: 103/73  93/69  Pulse: (!) 128  (!) 103  Resp: 17  17  Temp: 97.6 F (36.4 C)    TempSrc: Oral    SpO2: 95%  95%  Weight:  60.5 kg   Height:  _0  (1.702 m)      Vitals:   01-18-2020 1203 Jan 18, 2020 1206 01/18/2020 1228  BP: 103/73  93/69  Pulse: (!) 128  (!) 103  Resp: 17  17  Temp: 97.6 F (36.4 C)    TempSrc: Oral    SpO2: 95%  95%  Weight:  60.5 kg   Height:  _1  (1.702 m)   Constitutional: Distress secondary to pain, writhing on bed Eyes: PERRL, lids and conjunctivae normal ENMT: Mucous membranes are moist. Posterior pharynx clear of any exudate or lesions.poor dentition.  Neck:  normal, supple, no masses, no thyromegaly Respiratory: Mildly tachypneic, decreased at bases, no adventitious sounds Cardiovascular: Tachycardic, regular rhythm, no murmurs Abdomen: Tender to palpation in epigastrium and right upper quadrant, positive bowel sounds Musculoskeletal: no clubbing / cyanosis. No joint deformity upper and lower extremities. Good ROM, no contractures. Normal muscle tone.  Skin: no rashes, lesions, ulcers. No induration Neurologic: CN 2-12 grossly intact. Sensation intact, DTR normal. Strength 5/5 in all 4.  Psychiatric: Normal judgment and insight. Alert and oriented x 3. Normal mood.   Labs on Admission: I have personally reviewed following labs and imaging studies  CBC: Recent Labs  Lab Jan 18, 2020 1309  WBC 17.2*  NEUTROABS 15.3*  HGB 11.3*  HCT 34.3*  MCV 95.0  PLT 937   Basic Metabolic Panel: Recent Labs  Lab January 18, 2020 1309  NA 138  K 5.1  CL 107  CO2 20*  GLUCOSE 325*  BUN 41*  CREATININE 1.05*  CALCIUM 8.5*  MG 2.1   GFR: Estimated Creatinine Clearance: 46.3 mL/min (A) (by C-G formula based on SCr of 1.05 mg/dL (H)). Liver Function Tests: Recent Labs  Lab 2020-01-18 1309  AST 55*  ALT 45*  ALKPHOS 128*  BILITOT 1.1  PROT 6.6  ALBUMIN 3.0*   Recent Labs  Lab 01/18/20 1309  LIPASE 20   No results for input(s): AMMONIA in the last 168 hours. Coagulation Profile: No results for input(s): INR, PROTIME in the last 168 hours. Cardiac Enzymes: No results for input(s): CKTOTAL, CKMB, CKMBINDEX, TROPONINI in the last 168 hours. BNP (last 3 results) No results for input(s): PROBNP in the last 8760 hours. HbA1C: No results for input(s): HGBA1C in the last 72 hours. CBG: No results for input(s): GLUCAP in the last 168 hours. Lipid Profile: No results for input(s): CHOL, HDL, LDLCALC, TRIG, CHOLHDL, LDLDIRECT in the last 72 hours. Thyroid Function Tests: No results for input(s): TSH, T4TOTAL, FREET4, T3FREE, THYROIDAB in the last 72  hours. Anemia Panel: No results for input(s): VITAMINB12, FOLATE, FERRITIN, TIBC, IRON, RETICCTPCT in the last 72 hours. Urine analysis:  Component Value Date/Time   COLORURINE YELLOW (A) 01/23/2018 2212   APPEARANCEUR Cloudy (A) 11/10/2019 1047   LABSPEC 1.017 01/23/2018 2212   LABSPEC >1.050 (H) 04/04/2014 0953   PHURINE 8.0 01/23/2018 2212   GLUCOSEU Negative 11/10/2019 1047   GLUCOSEU NEGATIVE 04/04/2014 0953   HGBUR NEGATIVE 01/23/2018 2212   BILIRUBINUR moderate 12/08/2019 1056   BILIRUBINUR Negative 11/10/2019 1047   BILIRUBINUR NEGATIVE 04/04/2014 0953   KETONESUR 5 (A) 01/23/2018 2212   PROTEINUR Positive (A) 12/08/2019 1056   PROTEINUR Trace 11/10/2019 1047   PROTEINUR NEGATIVE 01/23/2018 2212   UROBILINOGEN 0.2 12/08/2019 1056   NITRITE positive 12/08/2019 1056   NITRITE Negative 11/10/2019 1047   NITRITE POSITIVE (A) 01/23/2018 2212   LEUKOCYTESUR Large (3+) (A) 12/08/2019 1056   LEUKOCYTESUR 2+ (A) 11/10/2019 1047   LEUKOCYTESUR 3+ 04/04/2014 0953    Radiological Exams on Admission: DG Chest Portable 1 View  Result Date: 2020/01/17 CLINICAL DATA:  Weakness and diaphoresis EXAM: PORTABLE CHEST 1 VIEW COMPARISON:  January 23, 2018 FINDINGS: There is a small left pleural effusion with left base atelectasis. The lungs elsewhere are clear. Heart size and pulmonary vascularity are normal. No adenopathy. No pneumothorax. No bone lesions. IMPRESSION: Small left pleural effusion with left base atelectasis. Lungs elsewhere clear. Cardiac silhouette within normal limits. Electronically Signed   By: Lowella Grip III M.D.   On: Jan 17, 2020 13:13    EKG: Not done in ED  Assessment/Plan Active Problems:   Sepsis secondary to UTI [facility])  Possible sepsis secondary to UTI Patient with history of E. coli with significant resistance pattern Was on cephalexin and Macrobid but has not been able to tolerate p.o. Presents with the elevated white blood cell count could be  indicative of infection White count could also be driven by pain Minimal elevation of procal Urine cx pending Plan: Aggressive fluid resucitation Follow urine culture  Empiric cefepime 2 g every 12 hours IV Follow blood cultures If urine and blood cultures are negative and patient does not become febrile and clears her white count we can likely discontinue antibiotics altogether  Severe abdominal pain Nausea Inability to tolerate p.o. This is likely driven by pancreatic mass with presumed hepatic deposits Unfortunately I do not know we will can add past aggressive pain control Plan: Oncology consult for recommendations.  Message sent to Dr. Mike Gip Patient may benefit from palliative care, defer to oncology Patient has not been seen by oncology in the office yet IV fluids Multimodal pain control Antiemetics  Acute hypoxic respiratory failure Patient received morphine and is in pain No acute findings on chest x-ray to explain hypoxia Supplemental oxygen as necessary, wean as tolerated  Diabetes mellitus Hold oral agents Sensitive sliding scale given poor p.o. intake  Hypertension Continue home lisinopril  GERD Protonix  COPD No home oxygen requirement Continue nebulizer  Acute kidney injury Mild, baseline creatinine 0.74 IV fluids Recheck in a.m.  Lactic acidosis Elevated to 3.2 Suspect secondary to dehydration and poor p.o. intake Unable to exclude sepsis at this time IV fluids as above  Transaminitis Likely secondary to hepatic deposits from metastatic pancreatic carcinoma Continue to trend  Leukocytosis Neutrophilic predominance Likely due to abdominal pain but unable to exclude infection Continue to trend  Normocytic anemia Hemoglobin 11.3, this appears at approximately baseline No indication for transfusion Continue to trend   DVT prophylaxis: Lovenox Code Status: DNR, discussed with patient and daughter Family Communication: Daughter at  bedside Disposition Plan: Anticipate return to previous home environment Consults  called: Oncology, Dr. Mike Gip Admission status: Inpatient, MedSurg   Sidney Ace MD [facility]   If 7PM-7AM, please contact night-coverage   01-05-2020, 2:06 PM

## 2020-01-18 NOTE — ED Provider Notes (Signed)
[facility] Emergency Department Provider Note   ____________________________________________   First MD Initiated Contact with Patient 2020-01-18 1202     (approximate)  I have reviewed the triage vital signs and the nursing notes.   HISTORY  Chief Complaint Fatigue    HPI Alexandra Yang is a 74 y.o. female with past medical history of diabetes, COPD, and pancreatic mass presents to the ED complaining of weakness and vomiting.  Patient states that she has been dealing with intermittent pain in her abdomen, particularly in her right flank and right upper quadrant, for the past couple of weeks.  Pain has been more severe over the past couple of days and she recently had outpatient CT that showed a pancreatic mass as well as liver metastases and findings concerning for peritoneal carcinomatosis.  With the worsening pain, she has had increased nausea and vomiting, has been unable to keep down liquids or solids for the past couple of days.  She has recently been treated for UTI, was having dysuria at that time that has now resolved, but she does endorse some suprapubic pain in addition to the right upper quadrant pain.  She has not had any fevers, cough, chest pain, shortness of breath, leg swelling or pain.  She has not yet been able to see oncology for the newly diagnosed mass, was referred to the ED by her PCP for admission.  Lab work remarkable for elevated BUN to creatinine ratio as well as mild AKI, consistent with dehydration.  Patient's heart rate is gradually improving following IV fluid bolus, nausea also improved following Zofran and she is asking for something to drink.  She continues to decline any pain medication.  Lab work shows no significant elevation in bilirubin or alk phos to suggest obstructive biliary pathology.  She does have leukocytosis, however no evidence of infectious process thus far and we will hold off on on antibiotics.  Case discussed with  hospitalist for admission.        Past Medical History:  Diagnosis Date  . Arthritis    osteoarthritis  . Cancer ([place]) 1995   endometrial ca  . Cataract   . COPD (chronic obstructive pulmonary disease) ([place])   . Diabetes mellitus without complication ([place])    type 2  . GERD (gastroesophageal reflux disease)   . History of kidney infection    had to have PICC line  . Neuropathy    both feet  . Osteopenia   . Osteoporosis   . Pneumonia     Patient Active Problem List   Diagnosis Date Noted  . Malignant neoplasm of tail of pancreas ([place]) 12/16/2019  . Combined abdominal and pelvic pain 12/15/2019  . Abdominal distension (gaseous) 12/15/2019  . Encounter for general adult medical examination with abnormal findings 11/16/2019  . Need for vaccination against Streptococcus pneumoniae using pneumococcal conjugate vaccine 13 11/16/2019  . Type 2 diabetes mellitus with hyperglycemia ([place]) 02/09/2019  . Nicotine dependence with current use 02/09/2019  . Gastroesophageal reflux disease without esophagitis 11/06/2018  . Fever and chills 07/26/2018  . Influenza A 07/26/2018  . Muscle spasms of neck 06/27/2018  . Mixed hyperlipidemia 05/20/2018  . Urinary tract infection with hematuria 02/02/2018  . Acute low back pain without sciatica 02/02/2018  . Acute upper respiratory infection 02/02/2018  . Nausea 02/02/2018  . Cough 01/04/2018  . Dysuria 11/15/2017  . Neuropathy 10/27/2017  . Screening for breast cancer 10/27/2017  . Uncontrolled type 2 diabetes mellitus with hyperglycemia (  [place]) 10/27/2017  . COPD (chronic obstructive pulmonary disease) with chronic bronchitis ([place]) 10/27/2017  . Sepsis ([place]) 12/19/2016  . Primary osteoarthritis of right hand 06/13/2014  . Inflammation of joint of right knee 11/10/2013  . Type 2 diabetes mellitus with peripheral neuropathy ([place]) 09/25/2011  . Essential hypertension 09/25/2011  . COPD (chronic obstructive pulmonary disease) ([place])  09/25/2011    Past Surgical History:  Procedure Laterality Date  . ABDOMINAL HYSTERECTOMY    . CHOLECYSTECTOMY    . EYE SURGERY    . KNEE ARTHROSCOPY Right   . TENDON TRANSFER Right 01/20/2015   Procedure: RIGHT HAND AND MIDDLE FINGER TENDON RECONSTRUCTION AND TRANSFER;  Surgeon: Roseanne Kaufman, MD;  Location: [facility];  Service: Orthopedics;  Laterality: Right;    Prior to Admission medications   Medication Sig Start Date End Date Taking? Authorizing Provider  Accu-Chek Softclix Lancets lancets Use as instructed to check BS everyday and PRN.  E11.65 12/02/19   Ronnell Freshwater, NP  albuterol (PROVENTIL) (2.5 MG/3ML) 0.083% nebulizer solution Take 2.5 mg by nebulization every 6 (six) hours as needed for wheezing or shortness of breath.    [provider]  Alcohol Swabs (B-D SINGLE USE SWABS REGULAR) PADS Use as directed with insulin injections and testing blood sugars three times daily 12/06/19   Ronnell Freshwater, NP  aspirin 81 MG chewable tablet Chew 81 mg by mouth every evening.     [provider]  Blood Glucose Monitoring Suppl (ACCU-CHEK GUIDE ME) w/Device KIT Use as directed to check BS every day and PRN 12/02/19   Ronnell Freshwater, NP  cephALEXin (KEFLEX) 500 MG capsule Take 1 capsule (500 mg total) by mouth 3 (three) times daily. 12/08/19   Ronnell Freshwater, NP  cholecalciferol (VITAMIN D) 1000 UNITS tablet Take 1,000 Units by mouth daily.    [provider]  CRANBERRY PO Take by mouth.    [provider]  glucose blood (ACCU-CHEK GUIDE) test strip Blood sugars tested QD and prn.  Dx - E11.65 12/02/19   Ronnell Freshwater, NP  HYDROcodone-acetaminophen (NORCO/VICODIN) 5-325 MG tablet Take 1 tablet by mouth every 6 (six) hours as needed for moderate pain. 12/16/19   Ronnell Freshwater, NP  ibuprofen (ADVIL) 800 MG tablet Take 1 tablet (800 mg total) by mouth 3 (three) times daily as needed. 12/06/19   Ronnell Freshwater, NP  insulin glargine, 2 Unit Dial,  (TOUJEO MAX SOLOSTAR) 300 UNIT/ML Solostar Pen Inject 10 Units into the skin at bedtime. 10/03/19   Ronnell Freshwater, NP  Insulin Pen Needle 32G X 4 MM MISC USE AS ONCE DAILY 08/12/17   Lavera Guise, MD  ipratropium-albuterol (DUONEB) 0.5-2.5 (3) MG/3ML SOLN Use as directed three time a day diag j44.9 08/03/18   Lavera Guise, MD  lansoprazole (PREVACID) 30 MG capsule Take 1 capsule (30 mg total) by mouth daily at 12 noon. 11/02/19   Ronnell Freshwater, NP  lisinopril (ZESTRIL) 20 MG tablet Take 1 tablet (20 mg total) by mouth daily. 12/06/19   Ronnell Freshwater, NP  metFORMIN (GLUCOPHAGE-XR) 500 MG 24 hr tablet TAKE 1 TABLET EVERY NIGHT 12/15/19   Ronnell Freshwater, NP  Multiple Vitamins-Minerals (CENTRUM SILVER PO) Take 1 tablet by mouth daily.    [provider]  nitrofurantoin, macrocrystal-monohydrate, (MACROBID) 100 MG capsule Take 1 capsule (100 mg total) by mouth 2 (two) times daily. 12/12/19   Ronnell Freshwater, NP  ondansetron (ZOFRAN ODT) 4 MG disintegrating  tablet Take 1 tablet (4 mg total) by mouth every 8 (eight) hours as needed for nausea or vomiting. 12/15/19   Ronnell Freshwater, NP  ondansetron (ZOFRAN) 4 MG tablet Take 1 tablet (4 mg total) by mouth every 8 (eight) hours as needed for nausea or vomiting. 07/26/18   Ronnell Freshwater, NP  phenazopyridine (PYRIDIUM) 200 MG tablet Take 1 tablet (200 mg total) by mouth 3 (three) times daily as needed for pain. 12/12/19   Ronnell Freshwater, NP  pregabalin (LYRICA) 100 MG capsule Take 1 capsule (100 mg total) by mouth at bedtime as needed (pain). 11/10/19   Ronnell Freshwater, NP  Probiotic Product (PROBIOTIC PO) Take by mouth.    [provider]  Semaglutide, 1 MG/DOSE, (OZEMPIC, 1 MG/DOSE,) 2 MG/1.5ML SOPN Inject 1 mg into the skin once a week. 08/09/19   Ronnell Freshwater, NP  simvastatin (ZOCOR) 40 MG tablet Take 1 tablet (40 mg total) by mouth at bedtime. 12/06/19   Ronnell Freshwater, NP  vitamin B-12 (CYANOCOBALAMIN) 1000 MCG  tablet Take 1,000 mcg by mouth daily.     [provider]    Allergies Sulfa antibiotics, Hydrocodone-guaifenesin, Oxycodone hcl, Percodan [oxycodone-aspirin], Ciprofloxacin, Naproxen, and Ultram [tramadol]  Family History  Problem Relation Age of Onset  . Pancreatic cancer Mother   . Cancer Mother        pancreaitis  . Lung cancer Father   . Breast cancer Sister 20    Social History Social History   Tobacco Use  . Smoking status: Current Every Day Smoker    Packs/day: 0.50    Years: 40.00    Pack years: 20.00    Types: Cigarettes  . Smokeless tobacco: Never Used  Substance Use Topics  . Alcohol use: Yes    Comment: occasional  . Drug use: No    Review of Systems  Constitutional: No fever/chills.  Positive for generalized weakness. Eyes: No visual changes. ENT: No sore throat. Cardiovascular: Denies chest pain. Respiratory: Denies shortness of breath. Gastrointestinal: Positive for abdominal pain, nausea, and vomiting.  No diarrhea.  No constipation. Genitourinary: Negative for dysuria. Musculoskeletal: Negative for back pain. Skin: Negative for rash. Neurological: Negative for headaches, focal weakness or numbness.  ____________________________________________   PHYSICAL EXAM:  VITAL SIGNS: ED Triage Vitals  Enc Vitals Group     BP 01-17-20 1203 103/73     Pulse Rate 01-17-2020 1203 (!) 128     Resp Jan 17, 2020 1203 17     Temp 01-17-20 1203 97.6 F (36.4 C)     Temp Source 01-17-20 1203 Oral     SpO2 2020/01/17 1203 95 %     Weight 2020/01/17 1206 133 lb 6.4 oz (60.5 kg)     Height 17-Jan-2020 1206 '5\' 7"'  (1.702 m)     Head Circumference --      Peak Flow --      Pain Score 2020/01/17 1205 5     Pain Loc --      Pain Edu? --      Excl. in [place]? --     Constitutional: Alert and oriented. Eyes: Conjunctivae are normal. Head: Atraumatic. Nose: No congestion/rhinnorhea. Mouth/Throat: Mucous membranes are dry. Neck: Normal ROM Cardiovascular:  Tachycardic, regular rhythm. Grossly normal heart sounds. Respiratory: Normal respiratory effort.  No retractions. Lungs CTAB. Gastrointestinal: Soft and tender to palpation in the right upper quadrant as well as suprapubic area.  Mild abdominal distention with positive fluid wave. Genitourinary: deferred Musculoskeletal: No lower extremity  tenderness nor edema. Neurologic:  Normal speech and language. No gross focal neurologic deficits are appreciated. Skin:  Skin is warm, dry and intact. No rash noted. Psychiatric: Mood and affect are normal. Speech and behavior are normal.  ____________________________________________   LABS (all labs ordered are listed, but only abnormal results are displayed)  Labs Reviewed  COMPREHENSIVE METABOLIC PANEL - Abnormal; Notable for the following components:      Result Value   CO2 20 (*)    Glucose, Bld 325 (*)    BUN 41 (*)    Creatinine, Ser 1.05 (*)    Calcium 8.5 (*)    Albumin 3.0 (*)    AST 55 (*)    ALT 45 (*)    Alkaline Phosphatase 128 (*)    GFR calc non Af Amer 53 (*)    All other components within normal limits  CBC WITH DIFFERENTIAL/PLATELET - Abnormal; Notable for the following components:   WBC 17.2 (*)    RBC 3.61 (*)    Hemoglobin 11.3 (*)    HCT 34.3 (*)    Neutro Abs 15.3 (*)    Abs Immature Granulocytes 0.13 (*)    All other components within normal limits  LACTIC ACID, PLASMA - Abnormal; Notable for the following components:   Lactic Acid, Venous 3.3 (*)    All other components within normal limits  CULTURE, BLOOD (ROUTINE X 2)  CULTURE, BLOOD (ROUTINE X 2)  SARS CORONAVIRUS 2 BY RT PCR (HOSPITAL ORDER, [place])  LIPASE, BLOOD  MAGNESIUM  URINALYSIS, COMPLETE (UACMP) WITH MICROSCOPIC  LACTIC ACID, PLASMA  PROCALCITONIN   ____________________________________________  EKG  ED ECG REPORT I, Blake Divine, the attending physician, personally viewed and interpreted this ECG.    Date: 01/07/20  EKG Time: 8:52  Rate: 98  Rhythm: normal sinus rhythm  Axis: Normal  Intervals:none  ST&T Change: None   PROCEDURES  Procedure(s) performed (including Critical Care):  Procedures   ____________________________________________   INITIAL IMPRESSION / ASSESSMENT AND PLAN / ED COURSE       74 year old female with past medical history of diabetes, COPD, and recently diagnosed pancreatic mass presents to the ED due to increasing abdominal pain as well as nausea and vomiting.  Reviewing her recent CT scans, she has pancreatic mass concerning for malignancy as well as apparent liver metastases and possible peritoneal carcinomatosis.  There is associated ascites which would account for her distention and fluid wave on exam.  She has ongoing tenderness to her right upper quadrant, likely due to known metastases.  Initial vital signs concerning for tachycardia and borderline hypotension, likely due to significant dehydration with patient's poor p.o. intake and very dry mucous membranes.  We will hydrate with IV fluids, sepsis seems unlikely but we will screen chest x-ray and UA, draw blood cultures.  Electrolytes pending, anticipate admission for IV fluid hydration and evaluation by oncology.      ____________________________________________   FINAL CLINICAL IMPRESSION(S) / ED DIAGNOSES  Final diagnoses:  Pancreatic mass  Intractable vomiting with nausea, unspecified vomiting type  Dehydration     ED Discharge Orders    None       Note:  This document was prepared using Dragon voice recognition software and may include unintentional dictation errors.   Blake Divine, MD January 07, 2020 1401

## 2020-01-18 NOTE — ED Triage Notes (Signed)
Pt in via [facility] from home with ongoing right flank pain w/ nausea and decreased po intake.  Complains of diaphoresis and near syncope episodes.  Pt also reports unable to have BM x 3 days.  Pt currently being treated for UTI.  New pancreatic mass found 7/30.  Per EMS, HR 130's, BP 90/50, CBG 329.    Per report, pt to be direct admit per PCP, Clayborn Bigness.   20G PIV to right hand upon arrival; 222mL normal saline given en route.

## 2020-01-18 NOTE — Progress Notes (Signed)
Cross Cover Brief Note  Medical examiner contacted. Case declined for autopsy need

## 2020-01-18 NOTE — Care Management (Signed)
Update on care and clinical status  Patient was transferred to stepdown unit for fluid resistant shock and tachycardia.  Communicated with PCCM attending.  On arrival to the intensive care unit patient appeared very uncomfortable and had severe abdominal pain.  Shortly thereafter she became verbally unresponsive and had increased work of breathing.  Patient appears to be actively in the dying process.  Critical care attending spoke with the daughter at bedside.  Relayed clinical condition and likely no chance of meaningful recovery.  Focus on pain control at this time.  Patient has Dilaudid as needed.  If patient needs escalation of her pain regimen overnight please contact cross cover  Ralene Muskrat MD

## 2020-01-18 NOTE — Progress Notes (Signed)
Patient passed away at 18:52. Patient arrived to ICU 3 at 18:17. Patient in pain (belly) and reported a lot of nausea but unable to bring anything up. Patient was heaving into a emesis bag and holding her belly. She was intimally alert and oriented still. Bolus started on 1A still infusing. Dr. Mortimer Fries saw patient at bedside and ordered more labs and medications to treat nausea. Daughter brought back to bedside and this RN went to get patient's medications. Remo Lipps RN (who was still at bedside after help settle the patient), called out that Dr. Mortimer Fries needed to come back to the patient. This RN responded to bedside too. Patient was purple in the face with agonal respirations, HR in the 40s but still had a pulse. Dr. Mortimer Fries had confirmed patient was a DNR. 1 mg of atropine given IV with little effect. Daughter updated about what was occurring by Dr. Mortimer Fries. Patient passed with Marquita Palms and this RN at bedside at 18:52. Daughter given patient's phone.

## 2020-01-18 NOTE — Consult Note (Signed)
Pharmacy Antibiotic Note  Alexandra Yang is a 74 y.o. female with medical history including diabetes, COPD, pancreatic mass concerning for malignancy admitted on 07-Jan-2020 with sepsis / UTI. Patient presented to the ED with right flank pain and N/V. Patient was on cephalexin as an outpatient for E coli urinary tract infection which was changed to nitrofurantoin due to persistent symptoms. Pharmacy has been consulted for cefepime dosing.  Plan: Cefepime 2 g q12h (renally adjusted)  Height: 5\' 7"  (170.2 cm) Weight: 60.5 kg (133 lb 6.4 oz) IBW/kg (Calculated) : 61.6  Temp (24hrs), Avg:97.6 F (36.4 C), Min:97.6 F (36.4 C), Max:97.6 F (36.4 C)  Recent Labs  Lab Jan 07, 2020 1309  WBC 17.2*  CREATININE 1.05*  LATICACIDVEN 3.3*    Estimated Creatinine Clearance: 46.3 mL/min (A) (by C-G formula based on SCr of 1.05 mg/dL (H)).    Allergies  Allergen Reactions  . Sulfa Antibiotics Hives  . Hydrocodone-Guaifenesin Other (See Comments)    Hallucinations  . Oxycodone Hcl   . Percodan [Oxycodone-Aspirin] Other (See Comments)    hallucinations  . Ciprofloxacin Rash and Other (See Comments)    Feels like body is on fire  . Naproxen Rash  . Ultram [Tramadol] Rash    Antimicrobials this admission: Cefepime 8/2 >>   Dose adjustments this admission: n/a  Microbiology results: 8/2 BCx: pending 7/22 UCx: E coli    Thank you for allowing pharmacy to be a part of this patient's care.  Benita Gutter 2020-01-07 2:22 PM

## 2020-01-18 NOTE — Significant Event (Signed)
Rapid Response Event Note  Reason for Call : Hypotension, elevated heart rate, patient just arrived from ED to 1A  Initial Focused Assessment: Rapid response RN arrived in patient's room with pt moaning in the bed. She did not want to open her eyes but was alert and able to hold a conversation. She complained of belly pain and nausea. BP with SBP in 70s, pulse weak and reflected that BP. HR 130s, ST. Hard to check patient's oxygen saturation. Patient has SNS manicure (very thick hard to remove polish) on all fingers which were also cold. Tried ear and forehead and when able to pick up, sats in the mid 90s on 2 L nasal cannula.   Interventions: 1 L LR bolus. Patient's had minimal improvement with blood pressure and heart rate after bolus initated.    Plan of Care: Patient to transfer to ICU 3 after discussion with patient's RN, rapid response RN, and MD (Dr. Priscella Mann). Report given to this RN from Jun RN (patient's RN). This RN personally transferred the patient to ICU.  Event Summary:  MD Notified: Dr. Priscella Mann Call Time: [id number] Arrival Time: 0254 End Time: 1817  Stephanie Acre, RN

## 2020-01-18 NOTE — Progress Notes (Signed)
The Clinical status was relayed to family in detail, Daughter at bedside  Updated and notified of patients medical condition.  Patient remains unresponsive and will not open eyes to command.   patient with increased WOB and using accessory muscles to breathe Explained to family course of therapy and the modalities     Patient with Progressive multiorgan failure with very low chance of meaningful recovery despite all aggressive and optimal medical therapy.  Patient is in the dying  Process associated with suffering.  Family understands the situation.  They have consented and agreed to DNR/DNI on admission to hospital.   Corrin Parker, M.D.  [facility] Pulmonary & Critical Care Medicine  Medical Director [place] Director [facility] Cardio-Pulmonary Department

## 2020-01-18 NOTE — Progress Notes (Signed)
Received pt from ED with Low BP and HR, initial VS was Red in MEWS Score. Charge Nurse and MD was notified. Rapid was called. New orders was initiated. BP was still soft, MD moved the pt to higher level of care as per recommendation of Rapid Team.

## 2020-01-18 NOTE — ED Notes (Signed)
Pt states she had a CT last Thursday done here , pt states they found a large  pancreatic mass. States she is also being treated for a UTI with ecoli but has not been able to take the medications due to severe nausea with "gagging", denies vomiting or diarrhea., states she has been having lower abd pain with right flank pain for about 10 days, with abd distention. States she has not had an appetite. Pt is a/ox4, daughter is at the bedside. States she has been referred to oncology but has not seen them yet.

## 2020-01-18 NOTE — TOC Initial Note (Signed)
Transition of Care [facility]) - Initial/Assessment Note    Patient Details  Name: Alexandra Yang MRN: [id number] Date of Birth: [birth year]  Transition of Care [facility] : [facility]) CM/SW Contact:    Anselm Pancoast, RN Phone Number: 12-28-19, 3:32 PM  Clinical Narrative:                 Spoke to patient and daughter at bedside. 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 Patient lives with her adult daughter who is disabled and her son in law who assists as needed. Patient is independent with ADL's and is involved with a bowling league that travels frequently. Patient states she does not expect to need assistance at discharge but will rereview when closer to discharge. Patient had home health in the past for PICC care but does not remember who the company was. No other needs at this time.   Expected Discharge Plan: [place] Barriers to Discharge: Continued Medical Work up   Patient Goals and CMS Choice Patient states their goals for this hospitalization and ongoing recovery are:: get stronger and get back home      Expected Discharge Plan and Services Expected Discharge Plan: [place]       Living arrangements for the past 2 months: Single Family Home                                      Prior Living Arrangements/Services Living arrangements for the past 2 months: Single Family Home Lives with:: Adult Children Patient language and need for interpreter reviewed:: Yes Do you feel safe going back to the place where you live?: Yes      Need for Family Participation in Patient Care: Yes (Comment) Care giver support system in place?: Yes (comment)   Criminal Activity/Legal Involvement Pertinent to Current Situation/Hospitalization: No - Comment as needed  Activities of Daily Living      Permission Sought/Granted                  Emotional Assessment Appearance:: Appears stated age Attitude/Demeanor/Rapport: Ambitious, Engaged,  Self-Confident Affect (typically observed): Accepting, Adaptable Orientation: : Oriented to Self, Oriented to Place, Oriented to  Time, Oriented to Situation Alcohol / Substance Use: Not Applicable Psych Involvement: No (comment)  Admission diagnosis:  Sepsis secondary to UTI ([place]) [A41.9, N39.0] Patient Active Problem List   Diagnosis Date Noted  . Sepsis secondary to UTI ([place]) 12-28-19  . Malignant neoplasm of tail of pancreas ([place]) 12/16/2019  . Combined abdominal and pelvic pain 12/15/2019  . Abdominal distension (gaseous) 12/15/2019  . Encounter for general adult medical examination with abnormal findings 11/16/2019  . Need for vaccination against Streptococcus pneumoniae using pneumococcal conjugate vaccine 13 11/16/2019  . Type 2 diabetes mellitus with hyperglycemia ([place]) 02/09/2019  . Nicotine dependence with current use 02/09/2019  . Gastroesophageal reflux disease without esophagitis 11/06/2018  . Fever and chills 07/26/2018  . Influenza A 07/26/2018  . Muscle spasms of neck 06/27/2018  . Mixed hyperlipidemia 05/20/2018  . Urinary tract infection with hematuria 02/02/2018  . Acute low back pain without sciatica 02/02/2018  . Acute upper respiratory infection 02/02/2018  . Nausea 02/02/2018  . Cough 01/04/2018  . Dysuria 11/15/2017  . Neuropathy 10/27/2017  . Screening for breast cancer 10/27/2017  . Uncontrolled type 2 diabetes mellitus with hyperglycemia ([place]) 10/27/2017  . COPD (chronic obstructive pulmonary disease)  with chronic bronchitis ([place]) 10/27/2017  . Sepsis ([place]) 12/19/2016  . Primary osteoarthritis of right hand 06/13/2014  . Inflammation of joint of right knee 11/10/2013  . Type 2 diabetes mellitus with peripheral neuropathy ([place]) 09/25/2011  . Essential hypertension 09/25/2011  . COPD (chronic obstructive pulmonary disease) ([place]) 09/25/2011   PCP:  Lavera Guise, MD Pharmacy:   [facility] [place], [address] AT [address] [place] [address] Phone: [phone] Fax: [facility] [place], [address] [address] Phone: [phone] Fax: [phone]     Social Determinants of Health (SDOH) Interventions    Readmission Risk Interventions No flowsheet data found.

## 2020-01-18 NOTE — ED Notes (Signed)
Lab called to straight stick patient for blood work due to patient being difficult stick

## 2020-01-18 NOTE — Death Summary Note (Signed)
Death summary note  Patient was admitted to the hospitalist service for chief complaint of abdominal pain, intractable nausea and vomiting, inability to tolerate p.o., potential UTI and associated sepsis on 01/10/20 at approximately 1400.  She had been recently diagnosed with pancreatic mass on outpatient CT after complaints of severe abdominal pain.  CT performed a few days prior to admission demonstrated a mass in the pancreatic tail with omental caking concerning for peritoneal carcinomatosis and hepatic lesions concerning for metastatic deposits.  She was scheduled to see oncology as an outpatient however clinical situation worsened and patient presented to the emergency department.    She was initially admitted to [place] floor and aggressively fluid resuscitated.  Despite aggressive fluid resuscitation received a call the rapid response being called at approximately 1730.  I spoke to bedside nurse as well as rapid response RN.  At that time the decision was made to transfer to stepdown unit for closer monitoring.  Patient received total 4 L bolus normal saline maintenance fluids despite this was hypotensive and tachycardic.  However she was still awake and conversant at the time of my evaluation and at the time of ICU transfer.  Shortly after ICU transfer the patient rapidly decompensated.  She became unresponsive and bradycardia apneic and bradycardic.  I spoke with ICU attending who indicated that the patient was actively dying.  I relayed that I did confirm DNR status with the patient and the daughter at bedside at time of admission.  ICU attempted atropine 1 mg with little effect.  Patient subsequently passed away at [place].  Pronounced by RN.  Chaplain called to bedside.  Ralene Muskrat MD

## 2020-01-18 NOTE — Progress Notes (Addendum)
[place] paged at 5:22pm for rapid response on 1A; when [facility] arrived, medical team evaluating pt.; pt. lying in bed moaning intermittently; pt.'s dtr. Jennifer in rm.  [place] spoke w/dtr. while team worked w/pt.  Dtr. shared pt. developed swelling in her belly last week and went to the doctor last Thursday; CT scan performed --> mass discovered on pt.'s pancreas; Dtr. shared pt.'s mother died of pancreatic cancer, so news of mass was traumatic for dtr. and pt.  Pt. admitted to ED today.  Dtr. attentive to pt.'s medications and treatments and shared information w/RN and RR RN.  Pt. moved to ICU for monitoring; shortly after pt.'s arrival, ICU RN called out into hallway pt.'s HR had dropped.  Dtr. at bedside; Dr. Mortimer Fries examined pt. and shared evaluation that pt. appeared to be dying; dtr. immediately left ICU to call her fiance in parking lot.  Both arrived just after pt. had died; dtr. deferred entering rm. to go make calls to other family members.  [place] checked in w/dtr. and fiance periodically; additional family members enroute.  [facility] remains available to assist w/coordinating family visitation.

## 2020-01-18 NOTE — Consult Note (Signed)
Name: Alexandra Yang MRN: [id number] DOB: 03-13-1946     CONSULTATION DATE: 2020/01/01  REFERRING MD : Priscella Mann  CHIEF COMPLAINT: ABD pain and shock   HISTORY OF PRESENT ILLNESS: 74 y.o. female with medical history significant of arthritis, COPD, history of endometrial carcinoma, GERD, diabetes presents the ED for evaluation of severe abdominal pain.    Patient had a recently diagnosed pancreatic tail mass 3.9 x 2.5 cm consistent with malignancy.    Also evidence in the liver of metastatic mets. Also evidence of omental caking concerning for peritoneal carcinomatosis.  The patient is a family history of pancreatic cancer with a mother dying of other disease at age 72.  She was referred to oncology however could not make it there presented to the emergency department given worsening abdominal pain, inability to tolerate anything by mouth.    She has a history of resistant pathogens and urinary tract and was recently treated with outpatient antibiotics for multidrug-resistant E. coli.  Cephalexin was prescribed which was effective.  Patient also was attempted Macrobid that she had persistent symptoms.  ED Course: Initial vitals and labs are taken.  Multiple metabolic abnormalities.  Patient received a dose of Zofran, 1 L of IV fluids, one 4 mg dose of morphine     8/2 TRANSFERRED TO SD FOR SHOCK She looks very uncomfortable and severe abdominal pain.   She complains of severe abdominal pain and distention. Given 4 L NS     PAST MEDICAL HISTORY :   has a past medical history of Arthritis, Cancer ([place]) (1995), Cataract, COPD (chronic obstructive pulmonary disease) ([place]), Diabetes mellitus without complication ([place]), GERD (gastroesophageal reflux disease), History of kidney infection, Neuropathy, Osteopenia, Osteoporosis, and Pneumonia.  has a past surgical history that includes Abdominal hysterectomy; Cholecystectomy; Knee arthroscopy (Right); Tendon transfer (Right, 01/20/2015);  and Eye surgery. Prior to Admission medications   Medication Sig Start Date End Date Taking? Authorizing Provider  cholecalciferol (VITAMIN D) 1000 UNITS tablet Take 1,000 Units by mouth daily.   Yes [provider]  CRANBERRY PO Take by mouth.   Yes [provider]  HYDROcodone-acetaminophen (NORCO/VICODIN) 5-325 MG tablet Take 1 tablet by mouth every 6 (six) hours as needed for moderate pain. 12/16/19  Yes Boscia, Greer Ee, NP  Multiple Vitamins-Minerals (CENTRUM SILVER PO) Take 1 tablet by mouth daily.   Yes [provider]  nitrofurantoin, macrocrystal-monohydrate, (MACROBID) 100 MG capsule Take 1 capsule (100 mg total) by mouth 2 (two) times daily. 12/12/19  Yes Boscia, Greer Ee, NP  pregabalin (LYRICA) 100 MG capsule Take 1 capsule (100 mg total) by mouth at bedtime as needed (pain). 11/10/19  Yes Ronnell Freshwater, NP  Probiotic Product (PROBIOTIC PO) Take by mouth.   Yes [provider]  vitamin B-12 (CYANOCOBALAMIN) 1000 MCG tablet Take 1,000 mcg by mouth daily.    Yes [provider]  Accu-Chek Softclix Lancets lancets Use as instructed to check BS everyday and PRN.  E11.65 12/02/19   Ronnell Freshwater, NP  albuterol (PROVENTIL) (2.5 MG/3ML) 0.083% nebulizer solution Take 2.5 mg by nebulization every 6 (six) hours as needed for wheezing or shortness of breath.    [provider]  Alcohol Swabs (B-D SINGLE USE SWABS REGULAR) PADS Use as directed with insulin injections and testing blood sugars three times daily 12/06/19   Ronnell Freshwater, NP  aspirin 81 MG chewable tablet Chew 81 mg by mouth every evening.     [provider]  Blood Glucose Monitoring  Suppl (ACCU-CHEK GUIDE ME) w/Device KIT Use as directed to check BS every day and PRN 12/02/19   Ronnell Freshwater, NP  cephALEXin (KEFLEX) 500 MG capsule Take 1 capsule (500 mg total) by mouth 3 (three) times daily. Patient not taking: Reported on 01-13-2020 12/08/19   Leretha Pol  E, NP  glucose blood (ACCU-CHEK GUIDE) test strip Blood sugars tested QD and prn.  Dx - E11.65 12/02/19   Ronnell Freshwater, NP  ibuprofen (ADVIL) 800 MG tablet Take 1 tablet (800 mg total) by mouth 3 (three) times daily as needed. 12/06/19   Ronnell Freshwater, NP  insulin glargine, 2 Unit Dial, (TOUJEO MAX SOLOSTAR) 300 UNIT/ML Solostar Pen Inject 10 Units into the skin at bedtime. 10/03/19   Ronnell Freshwater, NP  Insulin Pen Needle 32G X 4 MM MISC USE AS ONCE DAILY 08/12/17   Lavera Guise, MD  ipratropium-albuterol (DUONEB) 0.5-2.5 (3) MG/3ML SOLN Use as directed three time a day diag j44.9 08/03/18   Lavera Guise, MD  lansoprazole (PREVACID) 30 MG capsule Take 1 capsule (30 mg total) by mouth daily at 12 noon. 11/02/19   Ronnell Freshwater, NP  lisinopril (ZESTRIL) 20 MG tablet Take 1 tablet (20 mg total) by mouth daily. 12/06/19   Ronnell Freshwater, NP  metFORMIN (GLUCOPHAGE-XR) 500 MG 24 hr tablet TAKE 1 TABLET EVERY NIGHT Patient taking differently: Take 500 mg by mouth every evening. TAKE 1 TABLET EVERY NIGHT 12/15/19   Ronnell Freshwater, NP  ondansetron (ZOFRAN ODT) 4 MG disintegrating tablet Take 1 tablet (4 mg total) by mouth every 8 (eight) hours as needed for nausea or vomiting. 12/15/19   Ronnell Freshwater, NP  phenazopyridine (PYRIDIUM) 200 MG tablet Take 1 tablet (200 mg total) by mouth 3 (three) times daily as needed for pain. 12/12/19   Ronnell Freshwater, NP  Semaglutide, 1 MG/DOSE, (OZEMPIC, 1 MG/DOSE,) 2 MG/1.5ML SOPN Inject 1 mg into the skin once a week. 08/09/19   Ronnell Freshwater, NP  simvastatin (ZOCOR) 40 MG tablet Take 1 tablet (40 mg total) by mouth at bedtime. 12/06/19   Ronnell Freshwater, NP   Allergies  Allergen Reactions  . Sulfa Antibiotics Hives  . Hydrocodone-Guaifenesin Other (See Comments)    Hallucinations  . Oxycodone Hcl   . Percodan [Oxycodone-Aspirin] Other (See Comments)    hallucinations  . Ciprofloxacin Rash and Other (See Comments)    Feels like body is on  fire  . Naproxen Rash  . Ultram [Tramadol] Rash    FAMILY HISTORY:  family history includes Breast cancer (age of onset: 44) in her sister; Cancer in her mother; Lung cancer in her father; Pancreatic cancer in her mother. SOCIAL HISTORY:  reports that she has been smoking cigarettes. She has a 20.00 pack-year smoking history. She has never used smokeless tobacco. She reports current alcohol use. She reports that she does not use drugs.    Review of Systems:  Gen:  Weakness and nauseated feels terrible HEENT: Denies blurred vision, double vision, ear pain, eye pain, hearing loss, nose bleeds, sore throat Cardiac:  No dizziness, chest pain or heaviness, chest tightness,edema, No JVD Resp:   No cough, -sputum production, -shortness of breath,-wheezing, -hemoptysis,  Gi: + stomach pain, +nausea +vomiting, Gu:  Denies bladder incontinence, burning urine Other:  All other systems negative     VITAL SIGNS: Temp:  [97.6 F (36.4 C)] 97.6 F (36.4 C) (08/02 1203) Pulse Rate:  [103-128] 119 (08/02 1706)  Resp:  [17-25] 18 (08/02 1706) BP: (71-103)/(50-73) 71/50 (08/02 1706) SpO2:  [95 %-100 %] 100 % (08/02 1706) Weight:  [60.5 kg] 60.5 kg (08/02 1206)     SpO2: 100 %    Physical Examination:   GENERAL:+ fatigue looks distressed from pain HEAD: Normocephalic, atraumatic.  EYES: PERLA, EOMI No scleral icterus.  MOUTH: Moist mucosal membrane.  EAR, NOSE, THROAT: Clear without exudates. No external lesions.  NECK: Supple.  PULMONARY: CTA B/L+crackles CARDIOVASCULAR: S1 and S2. Regular rate and rhythm. No murmurs GASTROINTESTINAL: mild tenderness to palpation, +distended. Positive bowel sounds.  MUSCULOSKELETAL: No swelling, clubbing, or edema.  NEUROLOGIC: No gross focal neurological deficits. 5/5 strength all extremities SKIN: No ulceration, lesions, rashes, or cyanosis.  PSYCHIATRIC: Insight, judgment intact. -depression -anxiety ALL OTHER ROS ARE NEGATIVE   MEDICATIONS: I  have reviewed all medications and confirmed regimen as documented    CULTURE RESULTS   Recent Results (from the past 240 hour(s))  CULTURE, URINE COMPREHENSIVE     Status: None   Collection Time: 12/15/19 11:34 AM   Specimen: Urine   Urine  Result Value Ref Range Status   Urine Culture, Comprehensive Final report  Final   Organism ID, Bacteria Comment  Final    Comment: Mixed urogenital flora 8,000  Colonies/mL   SARS Coronavirus 2 by RT PCR (hospital order, performed in [place] hospital lab) Nasopharyngeal Nasopharyngeal Swab     Status: None   Collection Time: 01-13-20  2:20 PM   Specimen: Nasopharyngeal Swab  Result Value Ref Range Status   SARS Coronavirus 2 NEGATIVE NEGATIVE Final    Comment: (NOTE) SARS-CoV-2 target nucleic acids are NOT DETECTED.  The SARS-CoV-2 RNA is generally detectable in upper and lower respiratory specimens during the acute phase of infection. The lowest concentration of SARS-CoV-2 viral copies this assay can detect is 250 copies / mL. A negative result does not preclude SARS-CoV-2 infection and should not be used as the sole basis for treatment or other patient management decisions.  A negative result may occur with improper specimen collection / handling, submission of specimen other than nasopharyngeal swab, presence of viral mutation(s) within the areas targeted by this assay, and inadequate number of viral copies (<250 copies / mL). A negative result must be combined with clinical observations, patient history, and epidemiological information.  Fact Sheet for Patients:   StrictlyIdeas.no  Fact Sheet for Healthcare Providers: BankingDealers.co.za  This test is not yet approved or  cleared by the [place] FDA and has been authorized for detection and/or diagnosis of SARS-CoV-2 by FDA under an Emergency Use Authorization (EUA).  This EUA will remain in effect (meaning this test can be  used) for the duration of the COVID-19 declaration under Section 564(b)(1) of the Act, 21 U.S.C. section 360bbb-3(b)(1), unless the authorization is terminated or revoked sooner.  Performed at [facility], [address] [place], [place] [address]           IMAGING    DG Chest Portable 1 View  Result Date: 01/13/2020 CLINICAL DATA:  Weakness and diaphoresis EXAM: PORTABLE CHEST 1 VIEW COMPARISON:  January 23, 2018 FINDINGS: There is a small left pleural effusion with left base atelectasis. The lungs elsewhere are clear. Heart size and pulmonary vascularity are normal. No adenopathy. No pneumothorax. No bone lesions. IMPRESSION: Small left pleural effusion with left base atelectasis. Lungs elsewhere clear. Cardiac silhouette within normal limits. Electronically Signed   By: Lowella Grip III M.D.   On: January 13, 2020 13:13  CBC    Component Value Date/Time   WBC 17.2 (H) January 15, 2020 1309   RBC 3.61 (L) 2020-01-15 1309   HGB 11.3 (L) 01/15/20 1309   HGB 11.7 08/04/2018 1030   HCT 34.3 (L) 2020/01/15 1309   HCT 36.1 08/04/2018 1030   PLT 156 2020-01-15 1309   PLT 360 08/04/2018 1030   MCV 95.0 2020/01/15 1309   MCV 91 08/04/2018 1030   MCV 95 04/04/2014 0953   MCH 31.3 15-Jan-2020 1309   MCHC 32.9 2020/01/15 1309   RDW 13.0 2020/01/15 1309   RDW 12.1 08/04/2018 1030   RDW 13.2 04/04/2014 0953   LYMPHSABS 1.0 01/15/2020 1309   LYMPHSABS 2.5 04/04/2014 0953   MONOABS 0.8 Jan 15, 2020 1309   MONOABS 0.4 04/04/2014 0953   EOSABS 0.0 2020-01-15 1309   EOSABS 0.1 04/04/2014 0953   BASOSABS 0.0 01-15-20 1309   BASOSABS 0.0 04/04/2014 0953   BMP Latest Ref Rng & Units 2020-01-15 11/25/2019 12/07/2018  Glucose 70 - 99 mg/dL 325(H) 102(H) 116(H)  BUN 8 - 23 mg/dL 41(H) 39(H) 28(H)  Creatinine 0.44 - 1.00 mg/dL 1.05(H) 0.74 0.62  BUN/Creat Ratio 12 - 28 - - -  Sodium 135 - 145 mmol/L 138 140 139  Potassium 3.5 - 5.1 mmol/L 5.1 4.9 4.0  Chloride 98 - 111 mmol/L 107 107  105  CO2 22 - 32 mmol/L 20(L) 25 25  Calcium 8.9 - 10.3 mg/dL 8.5(L) 9.6 10.0   Glucose 325 LA 3.2 COVID NEG CXR LLL OPACITY/EFFUSION   ASSESSMENT AND PLAN SYNOPSIS  74 yo WF with presumed end stage pancreatic cancer with liver mets admitted for abd pain and N/V with hypovolemia shock and sepsis transferred for low BP  Septic/Hypovolumic  Shock Source of infection/inflammation is pancreatic cancer -use vasopressors to keep MAP>65 if needed -follow LA, consider bicarb therapy -follow up cultures -emperic ABX -stress dose steroids -aggressive IV fluid Resuscitation   GI GI PROPHYLAXIS as indicated  NUTRITIONAL STATUS DIET-->NPO Constipation protocol as indicated zofran and phenergan as needed    ELECTROLYTES -follow labs as needed -replace as needed -pharmacy consultation and following  ACUTE KIDNEY INJURY/Renal Failure -continue Foley Catheter-assess need -Avoid nephrotoxic agents -Follow urine output, BMP -Ensure adequate renal perfusion, optimize oxygenation -Renal dose medications   PAIN CONTROL as needed Dilaudid as needed May need to consider PCA May need to consider NG or [place]    DVT/GI PRX ordered and assessed TRANSFUSIONS AS NEEDED MONITOR FSBS I Assessed the need for Labs I Assessed the need for Foley I Assessed the need for Central Venous Line Family Discussion when available I Assessed the need for Mobilization I made an Assessment of medications to be adjusted accordingly Safety Risk assessment completed  CASE DISCUSSED IN MULTIDISCIPLINARY ROUNDS WITH ICU TEAM   OVERALL PROGNOSIS IS VERY POOR< PATIENT IS DNR/DNI. PALLIATIVE CARE TEAM CONSULTED  Levina Boyack Patricia Pesa, M.D.  [facility]  Medical Director [place] Director [facility] Cardio-Pulmonary Department

## 2020-01-18 DEATH — deceased

## 2020-02-09 ENCOUNTER — Ambulatory Visit: Payer: Medicare HMO | Admitting: Nurse Practitioner

## 2020-03-06 ENCOUNTER — Telehealth: Payer: Self-pay

## 2020-03-07 ENCOUNTER — Telehealth: Payer: Self-pay

## 2020-03-07 NOTE — Telephone Encounter (Signed)
Completed medical record request and mailed records to [facility] Attn: Ladona Horns [address] [place] [id number].

## 2020-03-09 NOTE — Telephone Encounter (Signed)
ERROR

## 2020-11-12 ENCOUNTER — Ambulatory Visit: Payer: Medicare HMO | Admitting: Nurse Practitioner

## 2021-01-24 IMAGING — CT CT ABD-PELV W/ CM
2 of 5 series · 16 of 46 positions shown, 18 images · IV contrast (APPLIED)
Comparison: April 04, 2014.

CLINICAL DATA: Acute generalized abdominal pain.

EXAM:
CT ABDOMEN AND PELVIS WITH CONTRAST
TECHNIQUE: Multidetector CT imaging of the abdomen and pelvis was performed
using the standard protocol following bolus administration of
intravenous contrast.
CONTRAST:  100mL OMNIPAQUE IOHEXOL 300 MG/ML  SOLN

[Series 2: routine abd/pel with · axial · 0.75mm/px · z∈[-538,-138]mm · 13 of 91 slices shown, 15 images]
[im 6/91  soft-tissue]
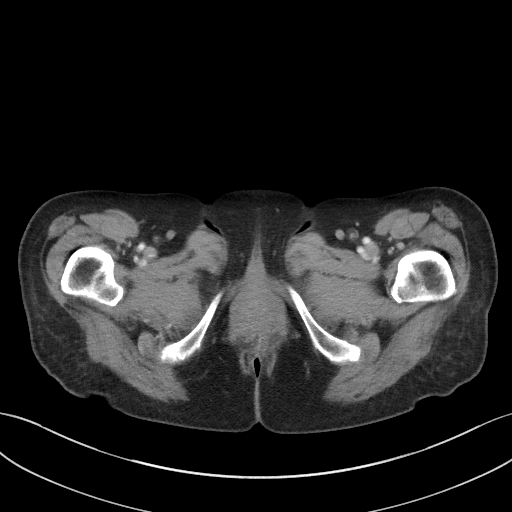
[im 6/91  bone]
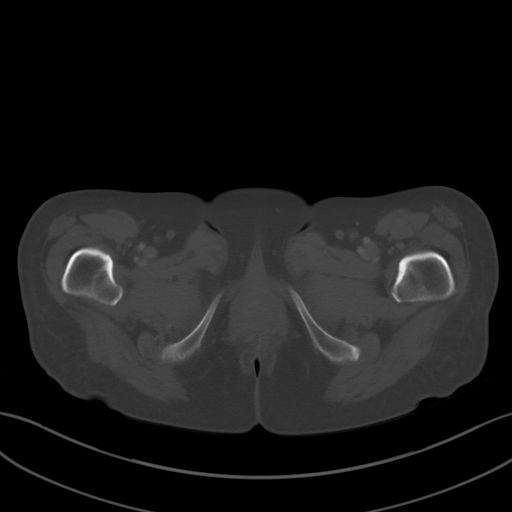
[im 11/91  soft-tissue]
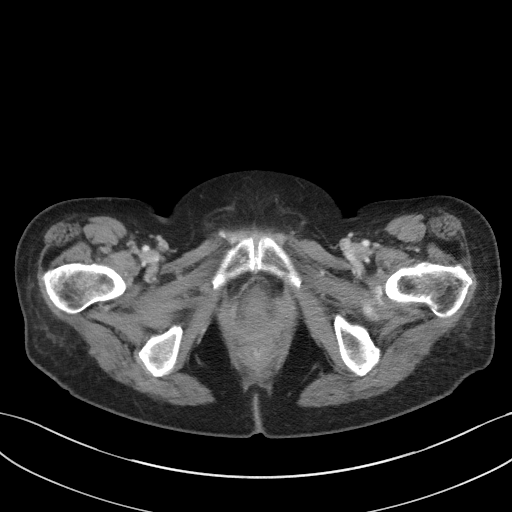
[im 21/91  soft-tissue]
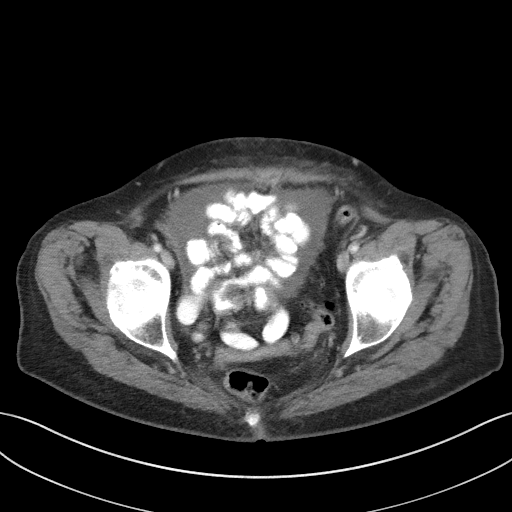
[im 26/91  soft-tissue]
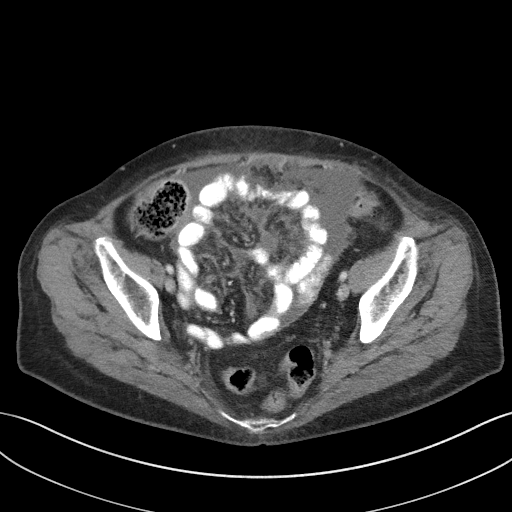
[im 31/91  soft-tissue]
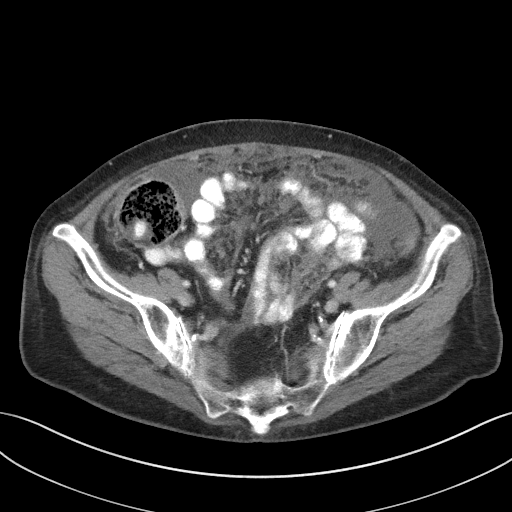
[im 41/91  soft-tissue]
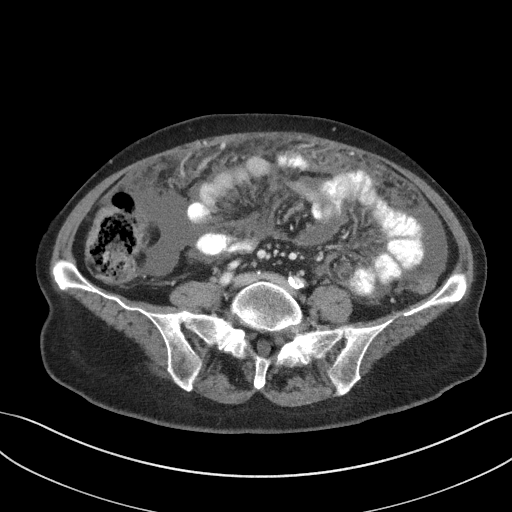
[im 46/91  soft-tissue]
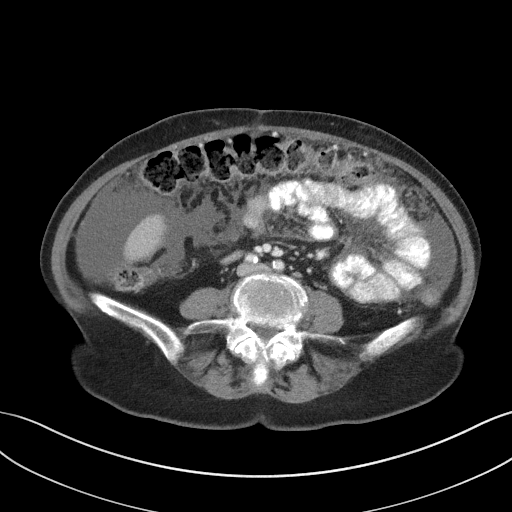
[im 51/91  soft-tissue]
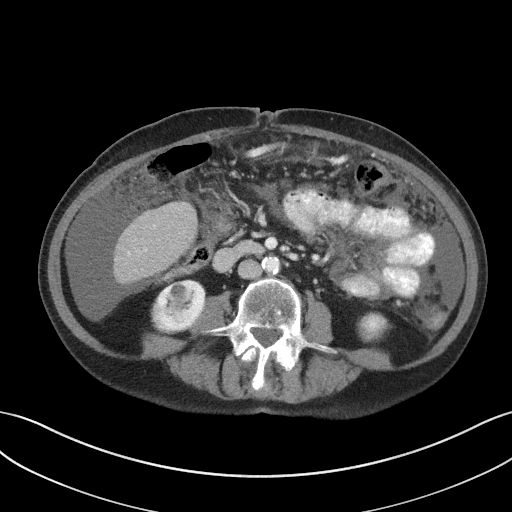
[im 61/91  soft-tissue]
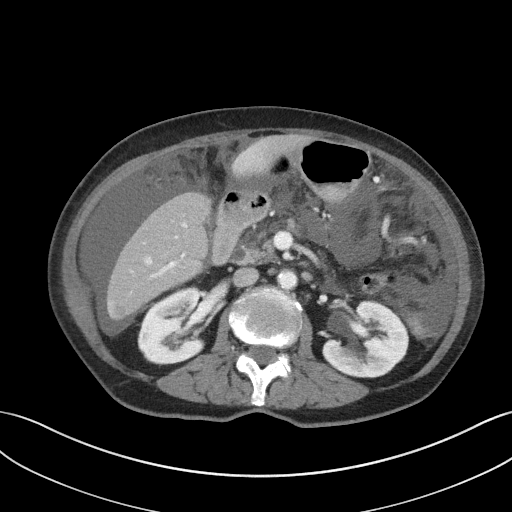
[im 61/91  bone]
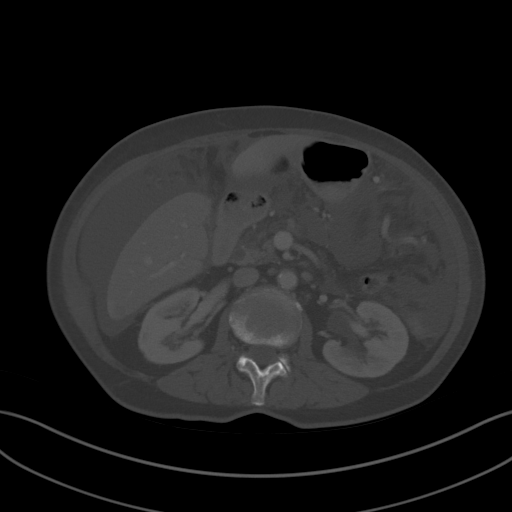
[im 66/91  soft-tissue]
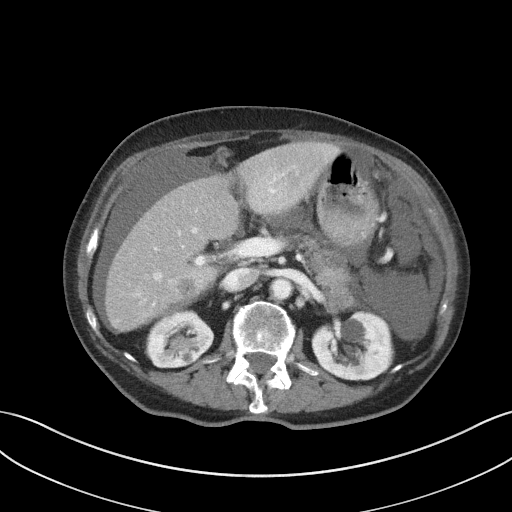
[im 71/91  soft-tissue]
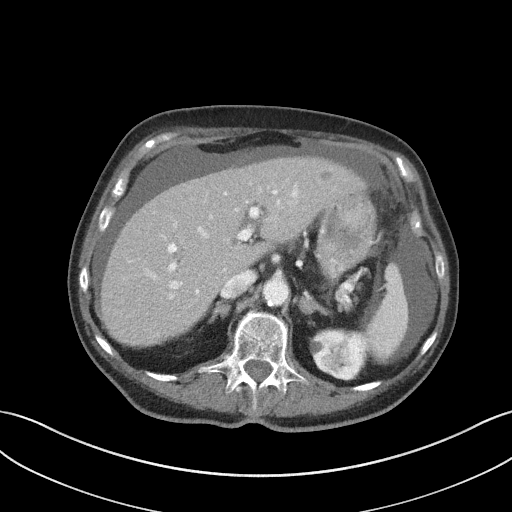
[im 81/91  soft-tissue]
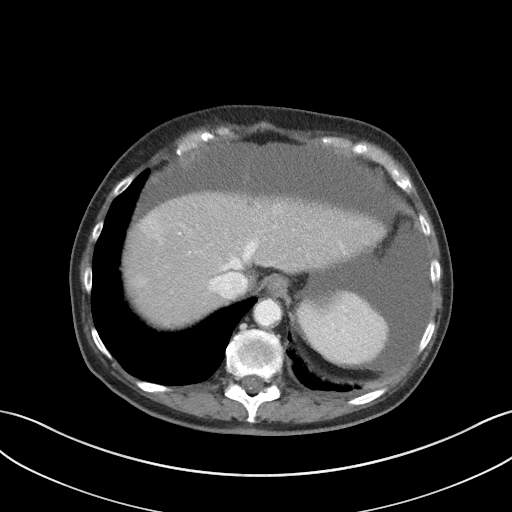
[im 86/91  soft-tissue]
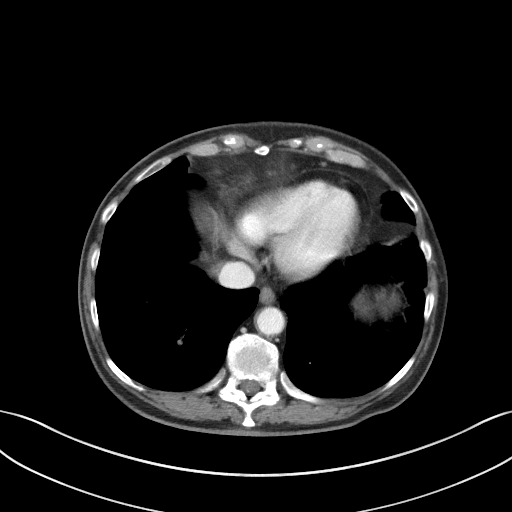

[Series 5: coronal st · coronal · 0.72mm/px · 3 of 87 slices shown]
[im 29/87  soft-tissue]
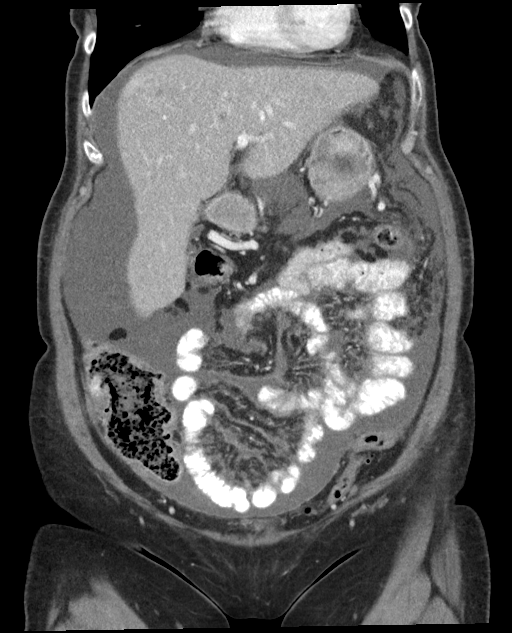
[im 39/87  soft-tissue]
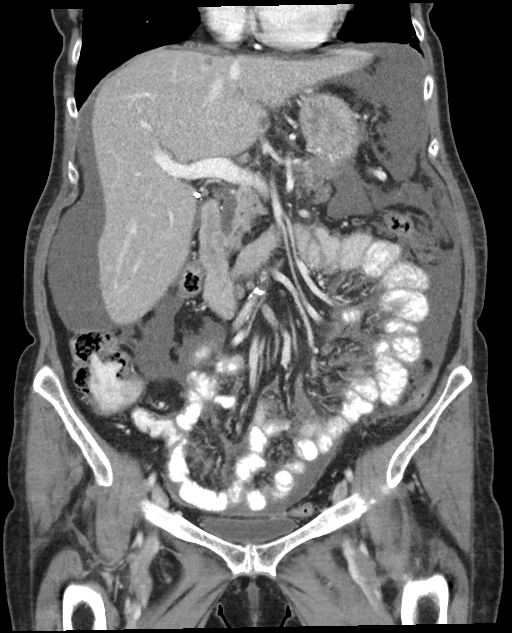
[im 48/87  soft-tissue]
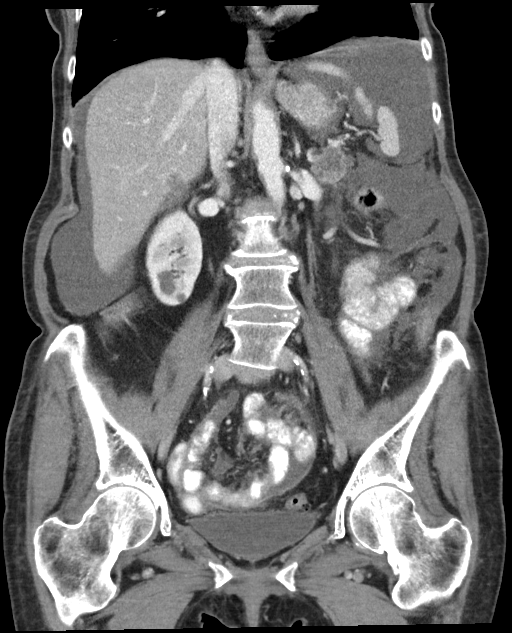

[16 of 46 positions shown; findings below may reference images not displayed]

FINDINGS: Lower chest: No acute abnormality.

Hepatobiliary: Status post cholecystectomy. No biliary dilatation is
noted. There is interval development of multiple rounded low
densities throughout the liver concerning for metastatic disease,
the largest measuring 1.8 cm in the left hepatic lobe.

Pancreas: 3.9 x 2.5 cm mass is seen in left pancreatic tail
consistent with malignancy.

Spleen: Normal in size without focal abnormality.

Adrenals/Urinary Tract: Adrenal glands appear normal. Bilateral
renal cysts are noted. No hydronephrosis or renal obstruction is
noted. Urinary bladder is unremarkable.

Stomach/Bowel: The stomach appears normal. There is no evidence of
bowel obstruction or inflammation. The appendix is not visualized,
but no inflammation is noted in the right lower quadrant.

Vascular/Lymphatic: Aortic atherosclerosis. No enlarged abdominal or
pelvic lymph nodes.

Reproductive: Status post hysterectomy. No adnexal masses.

Other: Mild ascites is seen throughout the abdomen. There appears to
be omental caking concerning for peritoneal carcinomatosis.

Musculoskeletal: No acute or significant osseous findings.
IMPRESSION: 1. 3.9 x 2.5 cm mass is seen in left pancreatic tail consistent with
malignancy. These results will be called to the ordering clinician
or representative by the Radiologist Assistant, and communication
documented in the PACS or zVision Dashboard.
2. Interval development of multiple rounded low densities throughout
the liver concerning for metastatic disease.
3. Mild ascites is noted throughout the abdomen. There appears to be
omental caking concerning for peritoneal carcinomatosis.

Aortic Atherosclerosis ([id number]).
